# Patient Record
Sex: Female | Born: 1980
Health system: Southern US, Community
[De-identification: ages and names within clinical notes are randomized; demographics above are authoritative.]

## PROBLEM LIST (undated history)

## (undated) DIAGNOSIS — J45909 Unspecified asthma, uncomplicated: Secondary | ICD-10-CM

## (undated) DIAGNOSIS — K259 Gastric ulcer, unspecified as acute or chronic, without hemorrhage or perforation: Secondary | ICD-10-CM

## (undated) DIAGNOSIS — F32A Depression, unspecified: Secondary | ICD-10-CM

## (undated) DIAGNOSIS — I639 Cerebral infarction, unspecified: Secondary | ICD-10-CM

## (undated) DIAGNOSIS — F419 Anxiety disorder, unspecified: Secondary | ICD-10-CM

## (undated) DIAGNOSIS — D219 Benign neoplasm of connective and other soft tissue, unspecified: Secondary | ICD-10-CM

## (undated) DIAGNOSIS — G43909 Migraine, unspecified, not intractable, without status migrainosus: Secondary | ICD-10-CM

## (undated) DIAGNOSIS — F329 Major depressive disorder, single episode, unspecified: Secondary | ICD-10-CM

## (undated) DIAGNOSIS — G40909 Epilepsy, unspecified, not intractable, without status epilepticus: Secondary | ICD-10-CM

## (undated) DIAGNOSIS — R569 Unspecified convulsions: Secondary | ICD-10-CM

## (undated) HISTORY — DX: Anxiety disorder, unspecified: F41.9

## (undated) HISTORY — DX: Migraine, unspecified, not intractable, without status migrainosus: G43.909

## (undated) HISTORY — DX: Depression, unspecified: F32.A

## (undated) HISTORY — PX: UPPER GI ENDOSCOPY: SHX6162

## (undated) HISTORY — PX: KNEE ARTHROSCOPY: SUR90

## (undated) HISTORY — DX: Gastric ulcer, unspecified as acute or chronic, without hemorrhage or perforation: K25.9

## (undated) HISTORY — PX: CHOLECYSTECTOMY: SHX55

## (undated) HISTORY — DX: Major depressive disorder, single episode, unspecified: F32.9

## (undated) HISTORY — PX: COLONOSCOPY: SHX174

## (undated) HISTORY — DX: Benign neoplasm of connective and other soft tissue, unspecified: D21.9

---

## 1999-03-21 ENCOUNTER — Emergency Department (HOSPITAL_COMMUNITY): Admission: EM | Admit: 1999-03-21 | Discharge: 1999-03-22 | Payer: Self-pay

## 1999-12-03 ENCOUNTER — Encounter: Payer: Self-pay | Admitting: Emergency Medicine

## 1999-12-03 ENCOUNTER — Emergency Department (HOSPITAL_COMMUNITY): Admission: EM | Admit: 1999-12-03 | Discharge: 1999-12-03 | Payer: Self-pay | Admitting: Emergency Medicine

## 2000-02-12 ENCOUNTER — Emergency Department (HOSPITAL_COMMUNITY): Admission: EM | Admit: 2000-02-12 | Discharge: 2000-02-12 | Payer: Self-pay | Admitting: Emergency Medicine

## 2000-05-13 ENCOUNTER — Emergency Department (HOSPITAL_COMMUNITY): Admission: EM | Admit: 2000-05-13 | Discharge: 2000-05-13 | Payer: Self-pay | Admitting: *Deleted

## 2000-05-13 ENCOUNTER — Encounter: Payer: Self-pay | Admitting: *Deleted

## 2001-04-02 ENCOUNTER — Emergency Department (HOSPITAL_COMMUNITY): Admission: EM | Admit: 2001-04-02 | Discharge: 2001-04-03 | Payer: Self-pay

## 2001-04-03 ENCOUNTER — Encounter: Payer: Self-pay | Admitting: Emergency Medicine

## 2002-05-13 ENCOUNTER — Emergency Department (HOSPITAL_COMMUNITY): Admission: EM | Admit: 2002-05-13 | Discharge: 2002-05-13 | Payer: Self-pay | Admitting: Emergency Medicine

## 2002-06-12 ENCOUNTER — Emergency Department (HOSPITAL_COMMUNITY): Admission: EM | Admit: 2002-06-12 | Discharge: 2002-06-12 | Payer: Self-pay

## 2002-07-30 ENCOUNTER — Emergency Department (HOSPITAL_COMMUNITY): Admission: EM | Admit: 2002-07-30 | Discharge: 2002-07-30 | Payer: Self-pay | Admitting: Emergency Medicine

## 2003-11-18 ENCOUNTER — Emergency Department (HOSPITAL_COMMUNITY): Admission: EM | Admit: 2003-11-18 | Discharge: 2003-11-18 | Payer: Self-pay | Admitting: Emergency Medicine

## 2004-03-30 ENCOUNTER — Emergency Department (HOSPITAL_COMMUNITY): Admission: EM | Admit: 2004-03-30 | Discharge: 2004-03-30 | Payer: Self-pay

## 2004-07-02 ENCOUNTER — Emergency Department (HOSPITAL_COMMUNITY): Admission: EM | Admit: 2004-07-02 | Discharge: 2004-07-02 | Payer: Self-pay | Admitting: Emergency Medicine

## 2005-01-16 ENCOUNTER — Emergency Department (HOSPITAL_COMMUNITY): Admission: EM | Admit: 2005-01-16 | Discharge: 2005-01-16 | Payer: Self-pay | Admitting: Emergency Medicine

## 2005-01-18 ENCOUNTER — Emergency Department (HOSPITAL_COMMUNITY): Admission: EM | Admit: 2005-01-18 | Discharge: 2005-01-18 | Payer: Self-pay | Admitting: Emergency Medicine

## 2005-03-02 ENCOUNTER — Emergency Department (HOSPITAL_COMMUNITY): Admission: EM | Admit: 2005-03-02 | Discharge: 2005-03-02 | Payer: Self-pay | Admitting: Emergency Medicine

## 2005-05-06 ENCOUNTER — Emergency Department (HOSPITAL_COMMUNITY): Admission: EM | Admit: 2005-05-06 | Discharge: 2005-05-06 | Payer: Self-pay | Admitting: Emergency Medicine

## 2006-01-15 ENCOUNTER — Emergency Department (HOSPITAL_COMMUNITY): Admission: EM | Admit: 2006-01-15 | Discharge: 2006-01-15 | Payer: Self-pay | Admitting: Emergency Medicine

## 2006-01-25 ENCOUNTER — Emergency Department (HOSPITAL_COMMUNITY): Admission: EM | Admit: 2006-01-25 | Discharge: 2006-01-25 | Payer: Self-pay | Admitting: Emergency Medicine

## 2006-05-18 ENCOUNTER — Emergency Department (HOSPITAL_COMMUNITY): Admission: EM | Admit: 2006-05-18 | Discharge: 2006-05-18 | Payer: Self-pay | Admitting: Emergency Medicine

## 2007-02-12 ENCOUNTER — Emergency Department (HOSPITAL_COMMUNITY): Admission: EM | Admit: 2007-02-12 | Discharge: 2007-02-12 | Payer: Self-pay | Admitting: Emergency Medicine

## 2007-06-18 ENCOUNTER — Ambulatory Visit: Payer: Self-pay | Admitting: Psychiatry

## 2007-06-18 ENCOUNTER — Inpatient Hospital Stay (HOSPITAL_COMMUNITY): Admission: AD | Admit: 2007-06-18 | Discharge: 2007-06-24 | Payer: Self-pay | Admitting: Psychiatry

## 2007-06-20 ENCOUNTER — Ambulatory Visit (HOSPITAL_COMMUNITY): Admission: RE | Admit: 2007-06-20 | Discharge: 2007-06-20 | Payer: Self-pay | Admitting: Psychiatry

## 2007-07-06 ENCOUNTER — Emergency Department (HOSPITAL_COMMUNITY): Admission: EM | Admit: 2007-07-06 | Discharge: 2007-07-06 | Payer: Self-pay | Admitting: Emergency Medicine

## 2008-01-02 ENCOUNTER — Emergency Department (HOSPITAL_COMMUNITY): Admission: EM | Admit: 2008-01-02 | Discharge: 2008-01-02 | Payer: Self-pay | Admitting: Family Medicine

## 2008-02-19 ENCOUNTER — Emergency Department (HOSPITAL_COMMUNITY): Admission: EM | Admit: 2008-02-19 | Discharge: 2008-02-19 | Payer: Self-pay | Admitting: Emergency Medicine
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
## 2011-06-26 LAB — URINALYSIS, ROUTINE W REFLEX MICROSCOPIC: Nitrite: NEGATIVE

## 2011-06-26 LAB — CBC
HCT: 33.1 — ABNORMAL LOW
Hemoglobin: 11.2 — ABNORMAL LOW
MCHC: 33.9
Platelets: 216

## 2011-06-26 LAB — COMPREHENSIVE METABOLIC PANEL
BUN: 11
CO2: 26
Chloride: 104
Creatinine, Ser: 0.99
GFR calc Af Amer: >60
GFR calc non Af Amer: >60
Glucose, Bld: 87
Potassium: 3.6
Sodium: 135

## 2011-06-26 LAB — DRUGS OF ABUSE SCREEN W/O ALC, ROUTINE URINE
Barbiturate Quant, Ur: NEGATIVE
Methadone: NEGATIVE
Opiate Screen, Urine: NEGATIVE
Propoxyphene: NEGATIVE

## 2014-12-15 HISTORY — PX: UMBILICAL HERNIA REPAIR: SHX196

## 2015-07-14 ENCOUNTER — Emergency Department (HOSPITAL_COMMUNITY)
Admission: EM | Admit: 2015-07-14 | Discharge: 2015-07-14 | Disposition: A | Discharge status: Home / Self Care | Payer: BLUE CROSS/BLUE SHIELD | Attending: Emergency Medicine | Admitting: Emergency Medicine

## 2015-07-14 ENCOUNTER — Encounter (HOSPITAL_COMMUNITY): Payer: Self-pay | Admitting: Emergency Medicine

## 2015-07-14 DIAGNOSIS — Z880 Allergy status to penicillin: Secondary | ICD-10-CM | POA: Diagnosis not present

## 2015-07-14 DIAGNOSIS — G40919 Epilepsy, unspecified, intractable, without status epilepticus: Secondary | ICD-10-CM

## 2015-07-14 DIAGNOSIS — G40909 Epilepsy, unspecified, not intractable, without status epilepticus: Secondary | ICD-10-CM | POA: Diagnosis not present

## 2015-07-14 DIAGNOSIS — Z79899 Other long term (current) drug therapy: Secondary | ICD-10-CM | POA: Insufficient documentation

## 2015-07-14 DIAGNOSIS — R569 Unspecified convulsions: Secondary | ICD-10-CM | POA: Diagnosis present

## 2015-07-14 HISTORY — DX: Epilepsy, unspecified, not intractable, without status epilepticus: G40.909

## 2015-07-14 LAB — I-STAT CHEM 8, ED
BUN: 9 mg/dL (ref 6–20)
CALCIUM ION: 1.13 mmol/L (ref 1.12–1.23)
CHLORIDE: 104 mmol/L (ref 101–111)
Creatinine, Ser: 0.8 mg/dL (ref 0.44–1.00)
Glucose, Bld: 82 mg/dL (ref 65–99)
HEMATOCRIT: 42 % (ref 36.0–46.0)
Hemoglobin: 14.3 g/dL (ref 12.0–15.0)
POTASSIUM: 3.4 mmol/L — AB (ref 3.5–5.1)
Sodium: 141 mmol/L (ref 135–145)
TCO2: 22 mmol/L (ref 0–100)

## 2015-07-14 LAB — CBG MONITORING, ED: Glucose-Capillary: 65 mg/dL (ref 65–99)

## 2015-07-14 MED ORDER — TOPIRAMATE 50 MG PO TABS: 50.0000 mg | ORAL_TABLET | Freq: Two times a day (BID) | ORAL | Status: DC

## 2015-07-14 MED ORDER — IBUPROFEN 800 MG PO TABS
800.0000 mg | ORAL_TABLET | Freq: Once | ORAL | Status: AC
  Administered 2015-07-14: 800 mg via ORAL
  Filled 2015-07-14: qty 1

## 2015-07-14 NOTE — ED Notes (Signed)
Bed: WA01 Expected date:  Expected time:  Means of arrival:  Comments: EMS- seizures 

## 2015-07-14 NOTE — ED Notes (Signed)
PT is a hard stick and would prefer to only get blood when an IV is done. RN notified.

## 2015-07-14 NOTE — ED Provider Notes (Signed)
CSN: [id number]     Arrival date & time 07/14/15  1520 History   First MD Initiated Contact with Patient 07/14/15 1524     Chief Complaint  Patient presents with  . Seizures     (Consider location/radiation/quality/duration/timing/severity/associated sxs/prior Treatment) Patient is a 34 y.o. female presenting with seizures.  Seizures Seizure activity on arrival: no   Seizure type:  Unable to specify Preceding symptoms: aura   Initial focality:  None Episode characteristics: no abnormal movements and no confusion   Return to baseline: no   Severity:  Mild Duration:  3 minutes Number of seizures this episode:  3 Progression:  Resolved Context: not alcohol withdrawal and not cerebral palsy   Recent head injury:  No recent head injuries PTA treatment:  None History of seizures: yes       Past Medical History  Diagnosis Date  . Epilepsy ([place])    No past surgical history on file. No family history on file. Social History  Substance Use Topics  . Smoking status: None  . Smokeless tobacco: None  . Alcohol Use: None   OB History    No data available     Review of Systems  Eyes: Negative for pain.  Respiratory: Negative for cough and shortness of breath.   Cardiovascular: Negative for chest pain and palpitations.  Gastrointestinal: Negative for nausea and vomiting.  Endocrine: Negative for polydipsia and polyuria.  Genitourinary: Negative for dysuria and flank pain.  Neurological: Positive for seizures.  All other systems reviewed and are negative.     Allergies  Bee venom; Chocolate; Mosquito (diagnostic); Oregano; Garlic; Orange fruit; and Penicillins  Home Medications   Prior to Admission medications   Medication Sig Start Date End Date Taking? Authorizing Provider  albuterol (PROVENTIL HFA;VENTOLIN HFA) 108 (90 BASE) MCG/ACT inhaler Inhale 2 puffs into the lungs every 4 (four) hours as needed for wheezing or shortness of breath.   Yes Historical Provider,  MD  cetirizine (ZYRTEC) 10 MG tablet Take 10 mg by mouth daily.   Yes Historical Provider, MD  Fluticasone Furoate-Vilanterol (BREO ELLIPTA) 200-25 MCG/INH AEPB Inhale 1 puff into the lungs daily after breakfast.   Yes Historical Provider, MD  omeprazole (PRILOSEC) 20 MG capsule Take 20 mg by mouth daily.   Yes Historical Provider, MD  traZODone (DESYREL) 50 MG tablet Take 50 mg by mouth at bedtime.   Yes Historical Provider, MD  topiramate (TOPAMAX) 50 MG tablet Take 1 tablet (50 mg total) by mouth 2 (two) times daily. 07/14/15   Merrily Pew, MD   BP 115/82 mmHg  Pulse 75  Temp(Src) 98.4 F (36.9 C) (Oral)  Resp 17  SpO2 100% Physical Exam  Neurological:  No altered mental status, able to give full seemingly accurate history.  Face is symmetric, EOM's intact, pupils equal and reactive, vision intact, tongue and uvula midline without deviation Upper and Lower extremity motor 5/5, intact pain perception in distal extremities, 2+ reflexes in biceps, patella and achilles tendons. Finger to nose normal, heel to shin normal.   Nursing note and vitals reviewed.   ED Course  Procedures (including critical care time) Labs Review Labs Reviewed  I-STAT CHEM 8, ED - Abnormal; Notable for the following:    Potassium 3.4 (*)    All other components within normal limits  CBG MONITORING, ED    Imaging Review No results found. I have personally reviewed and evaluated these images and lab results as part of my medical decision-making.   EKG Interpretation  Date/Time:  Saturday July 14 2015 15:31:21 EDT Ventricular Rate:  83 PR Interval:  141 QRS Duration: 74 QT Interval:  373 QTC Calculation: 438 R Axis:   76 Text Interpretation:  Normal sinus rhythm Confirmed by Danila Eddie MD, Corene Cornea  [phone]) on 07/14/2015 5:02:23 PM      MDM   Final diagnoses:  Breakthrough seizure ([place])   34 year old female history of seizures. Initially not able to obtain history or exam secondary to her  post ictal state. When patient woke and her girlfriend was at bedside found out she had had 3 back-to-back seizures that sounded similar to previous was just had. She is on her medications or any changes. She hasn't had any recent infections or vomiting. CMP was normal patient close to baseline prior to discharge. Gave her information for neurologists in the area and she will follow up with them for further evaluation and management of her epilepsy.  I have personally and contemperaneously reviewed labs and imaging and used in my decision making as above.   A medical screening exam was performed and I feel the patient has had an appropriate workup for their chief complaint at this time and likelihood of emergent condition existing is low. They have been counseled on decision, discharge, follow up and which symptoms necessitate immediate return to the emergency department. They or their family verbally stated understanding and agreement with plan and discharged in stable condition.      Merrily Pew, MD 07/14/15 [phone]
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
## 2015-07-14 NOTE — ED Notes (Signed)
Per EMS: Pt from home.  3 witnessed seizures.  Takes meds for them.  Can't remember what it's called.  Moved here recently so doesn't have a doctor recently.  States she has been taking meds as she should.  No seizures with EMS.  A&O x 4. A little postictal.

## 2015-07-29 ENCOUNTER — Emergency Department (HOSPITAL_COMMUNITY)
Admission: EM | Admit: 2015-07-29 | Discharge: 2015-07-29 | Disposition: A | Discharge status: Home / Self Care | Payer: BLUE CROSS/BLUE SHIELD | Attending: Emergency Medicine | Admitting: Emergency Medicine

## 2015-07-29 ENCOUNTER — Encounter (HOSPITAL_COMMUNITY): Payer: Self-pay | Admitting: *Deleted

## 2015-07-29 DIAGNOSIS — G40909 Epilepsy, unspecified, not intractable, without status epilepticus: Secondary | ICD-10-CM | POA: Insufficient documentation

## 2015-07-29 DIAGNOSIS — Z79899 Other long term (current) drug therapy: Secondary | ICD-10-CM | POA: Insufficient documentation

## 2015-07-29 DIAGNOSIS — F17210 Nicotine dependence, cigarettes, uncomplicated: Secondary | ICD-10-CM | POA: Diagnosis not present

## 2015-07-29 DIAGNOSIS — Z3202 Encounter for pregnancy test, result negative: Secondary | ICD-10-CM | POA: Diagnosis not present

## 2015-07-29 DIAGNOSIS — Z880 Allergy status to penicillin: Secondary | ICD-10-CM | POA: Insufficient documentation

## 2015-07-29 DIAGNOSIS — R569 Unspecified convulsions: Secondary | ICD-10-CM

## 2015-07-29 DIAGNOSIS — J45909 Unspecified asthma, uncomplicated: Secondary | ICD-10-CM | POA: Diagnosis not present

## 2015-07-29 HISTORY — DX: Unspecified asthma, uncomplicated: J45.909

## 2015-07-29 LAB — POC URINE PREG, ED: PREG TEST UR: NEGATIVE

## 2015-07-29 MED ORDER — SODIUM CHLORIDE 0.9 % IV SOLN
1000.0000 mg | Freq: Once | INTRAVENOUS | Status: AC
  Administered 2015-07-29: 1000 mg via INTRAVENOUS
  Filled 2015-07-29: qty 10

## 2015-07-29 MED ORDER — LEVETIRACETAM 500 MG PO TABS: 500.0000 mg | ORAL_TABLET | Freq: Two times a day (BID) | ORAL | Status: DC

## 2015-07-29 MED ORDER — ACETAMINOPHEN 325 MG PO TABS
650.0000 mg | ORAL_TABLET | Freq: Once | ORAL | Status: AC
Start: 2015-07-29 — End: 2015-07-29
  Administered 2015-07-29: 650 mg via ORAL
  Filled 2015-07-29: qty 2

## 2015-07-29 NOTE — ED Notes (Signed)
EMS called to home.  Found patient post-ictal after a 15 second witnessed seizure. Patient is alert and oriented arrival to the ED.  Patient denies any pain.  Patient did not Hit her head

## 2015-07-29 NOTE — ED Notes (Signed)
Bed: [id number] Expected date: 07/29/15 Expected time: 7:17 PM Means of arrival: Ambulance Comments: seizure

## 2015-07-29 NOTE — Discharge Instructions (Signed)

## 2015-07-29 NOTE — ED Provider Notes (Signed)
CSN: [id number]     Arrival date & time 07/29/15  1929 History   First MD Initiated Contact with Patient 07/29/15 1941     Chief Complaint  Patient presents with  . Seizures     (Consider location/radiation/quality/duration/timing/severity/associated sxs/prior Treatment) HPI Comments: Patient presents after a seizure. She has a history of seizures. She states she's had seizures since she was 34 years old. She was on Dilantin for a long period time. She recently moved from [place]. Prior to her recent move, and October, she was switched to Topamax. She states that she's been having frequent seizures since starting the Topamax. She started the Topamax due to side effects from the Dilantin. She had witnessed 2 seizures today each lasting about 1-2 minutes. She was not incontinent. She did not bite her tongue. She has a mild headache but denies any other injuries from the seizure. She denies any fevers or other recent illnesses. She states she's been taking her medicine consistently.  Patient is a 34 y.o. female presenting with seizures.  Seizures   Past Medical History  Diagnosis Date  . Epilepsy ([place])   . Asthma    No past surgical history on file. No family history on file. Social History  Substance Use Topics  . Smoking status: Current Every Day Smoker    Types: Cigarettes  . Smokeless tobacco: None  . Alcohol Use: No   OB History    No data available     Review of Systems  Constitutional: Negative for fever, chills, diaphoresis and fatigue.  HENT: Negative for congestion, rhinorrhea and sneezing.   Eyes: Negative.   Respiratory: Negative for cough, chest tightness and shortness of breath.   Cardiovascular: Negative for chest pain and leg swelling.  Gastrointestinal: Negative for nausea, vomiting, abdominal pain, diarrhea and blood in stool.  Genitourinary: Negative for frequency, hematuria, flank pain and difficulty urinating.  Musculoskeletal: Negative for back pain  and arthralgias.  Skin: Negative for rash.  Neurological: Positive for seizures and headaches. Negative for dizziness, speech difficulty, weakness and numbness.      Allergies  Bee venom; Chocolate; Mosquito (diagnostic); Oregano; Garlic; Orange fruit; Penicillins; and Tape  Home Medications   Prior to Admission medications   Medication Sig Start Date End Date Taking? Authorizing Provider  albuterol (PROVENTIL HFA;VENTOLIN HFA) 108 (90 BASE) MCG/ACT inhaler Inhale 2 puffs into the lungs every 4 (four) hours as needed for wheezing or shortness of breath.   Yes Historical Provider, MD  cetirizine (ZYRTEC) 10 MG tablet Take 10 mg by mouth daily as needed for allergies.    Yes Historical Provider, MD  Fluticasone Furoate-Vilanterol (BREO ELLIPTA) 200-25 MCG/INH AEPB Inhale 1 puff into the lungs daily after breakfast.   Yes Historical Provider, MD  omeprazole (PRILOSEC) 20 MG capsule Take 20 mg by mouth daily.   Yes Historical Provider, MD  topiramate (TOPAMAX) 50 MG tablet Take 1 tablet (50 mg total) by mouth 2 (two) times daily. 07/14/15  Yes Merrily Pew, MD  traZODone (DESYREL) 50 MG tablet Take 50 mg by mouth at bedtime.   Yes Historical Provider, MD  levETIRAcetam (KEPPRA) 500 MG tablet Take 1 tablet (500 mg total) by mouth 2 (two) times daily. 07/29/15   Malvin Johns, MD   BP 126/65 mmHg  Pulse 89  Temp(Src) 98.3 F (36.8 C) (Oral)  Resp 18  SpO2 100%  LMP 06/28/2015 (Approximate) Physical Exam  Constitutional: She is oriented to person, place, and time. She appears well-developed and well-nourished.  HENT:  Head: Normocephalic and atraumatic.  Eyes: Pupils are equal, round, and reactive to light.  Neck: Normal range of motion. Neck supple.  Cardiovascular: Normal rate, regular rhythm and normal heart sounds.   Pulmonary/Chest: Effort normal and breath sounds normal. No respiratory distress. She has no wheezes. She has no rales. She exhibits no tenderness.  Abdominal: Soft.  Bowel sounds are normal. There is no tenderness. There is no rebound and no guarding.  Musculoskeletal: Normal range of motion. She exhibits no edema.  Lymphadenopathy:    She has no cervical adenopathy.  Neurological: She is alert and oriented to person, place, and time. She has normal strength. No cranial nerve deficit or sensory deficit. GCS eye subscore is 4. GCS verbal subscore is 5. GCS motor subscore is 6.  Skin: Skin is warm and dry. No rash noted.  Psychiatric: She has a normal mood and affect.    ED Course  Procedures (including critical care time) Labs Review Labs Reviewed  POC URINE PREG, ED    Imaging Review No results found. I have personally reviewed and evaluated these images and lab results as part of my medical decision-making.   EKG Interpretation None      MDM   Final diagnoses:  Seizure [facility])    Patient has had no further seizure activity in the ED. She does states that she's had frequent seizures since switching to the Topamax. She was here on October 29 with seizures. I spoke with Dr. Nicole Kindred who recommends loading her with Keppra and starting her on oral Keppra 500 mg twice a day in addition to her Topamax. I will give her outpatient referral to neurology.    Malvin Johns, MD 07/29/15 2216

## 2015-07-29 NOTE — ED Notes (Signed)
Patient is alert and oriented x3.  She was given DC instructions and follow up visit instructions.  Patient gave verbal understanding. She was DC ambulatory under her own power to home.  V/S stable.  He was not showing any signs of distress on DC 

## 2015-07-30 ENCOUNTER — Encounter (HOSPITAL_COMMUNITY): Payer: Self-pay | Admitting: *Deleted

## 2015-07-30 ENCOUNTER — Emergency Department (HOSPITAL_COMMUNITY)
Admission: EM | Admit: 2015-07-30 | Discharge: 2015-07-31 | Disposition: A | Discharge status: Other Acute Inpatient Hospital | Payer: BLUE CROSS/BLUE SHIELD | Attending: Emergency Medicine | Admitting: Emergency Medicine

## 2015-07-30 DIAGNOSIS — Y998 Other external cause status: Secondary | ICD-10-CM | POA: Diagnosis not present

## 2015-07-30 DIAGNOSIS — Z8673 Personal history of transient ischemic attack (TIA), and cerebral infarction without residual deficits: Secondary | ICD-10-CM | POA: Insufficient documentation

## 2015-07-30 DIAGNOSIS — J45909 Unspecified asthma, uncomplicated: Secondary | ICD-10-CM | POA: Insufficient documentation

## 2015-07-30 DIAGNOSIS — F1510 Other stimulant abuse, uncomplicated: Secondary | ICD-10-CM | POA: Insufficient documentation

## 2015-07-30 DIAGNOSIS — T43211A Poisoning by selective serotonin and norepinephrine reuptake inhibitors, accidental (unintentional), initial encounter: Secondary | ICD-10-CM | POA: Diagnosis present

## 2015-07-30 DIAGNOSIS — Y9389 Activity, other specified: Secondary | ICD-10-CM | POA: Diagnosis not present

## 2015-07-30 DIAGNOSIS — Y9289 Other specified places as the place of occurrence of the external cause: Secondary | ICD-10-CM | POA: Diagnosis not present

## 2015-07-30 DIAGNOSIS — T1491 Suicide attempt: Secondary | ICD-10-CM | POA: Diagnosis not present

## 2015-07-30 DIAGNOSIS — F17200 Nicotine dependence, unspecified, uncomplicated: Secondary | ICD-10-CM | POA: Insufficient documentation

## 2015-07-30 DIAGNOSIS — Z3202 Encounter for pregnancy test, result negative: Secondary | ICD-10-CM | POA: Insufficient documentation

## 2015-07-30 DIAGNOSIS — X58XXXA Exposure to other specified factors, initial encounter: Secondary | ICD-10-CM | POA: Diagnosis not present

## 2015-07-30 DIAGNOSIS — T1491XA Suicide attempt, initial encounter: Secondary | ICD-10-CM

## 2015-07-30 HISTORY — DX: Unspecified convulsions: R56.9

## 2015-07-30 HISTORY — DX: Cerebral infarction, unspecified: I63.9

## 2015-07-30 LAB — RAPID URINE DRUG SCREEN, HOSP PERFORMED
AMPHETAMINES: NOT DETECTED
BARBITURATES: POSITIVE — AB
Benzodiazepines: NOT DETECTED
Cocaine: NOT DETECTED
Opiates: NOT DETECTED
TETRAHYDROCANNABINOL: NOT DETECTED

## 2015-07-30 LAB — COMPREHENSIVE METABOLIC PANEL
ALK PHOS: 41 U/L (ref 38–126)
ALT: 16 U/L (ref 14–54)
ANION GAP: 8 (ref 5–15)
AST: 21 U/L (ref 15–41)
Albumin: 4.2 g/dL (ref 3.5–5.0)
BILIRUBIN TOTAL: 0.9 mg/dL (ref 0.3–1.2)
BUN: 11 mg/dL (ref 6–20)
CALCIUM: 9.3 mg/dL (ref 8.9–10.3)
CO2: 19 mmol/L — ABNORMAL LOW (ref 22–32)
Chloride: 110 mmol/L (ref 101–111)
Creatinine, Ser: 0.89 mg/dL (ref 0.44–1.00)
GFR calc non Af Amer: >60 mL/min (ref >60)
Glucose, Bld: 89 mg/dL (ref 65–99)
Potassium: 3.9 mmol/L (ref 3.5–5.1)
Sodium: 137 mmol/L (ref 135–145)
TOTAL PROTEIN: 7.3 g/dL (ref 6.5–8.1)

## 2015-07-30 LAB — I-STAT BETA HCG BLOOD, ED (MC, WL, AP ONLY)

## 2015-07-30 LAB — CBC WITH DIFFERENTIAL/PLATELET
BASOS PCT: 1 %
Basophils Absolute: 0 10*3/uL (ref 0.0–0.1)
EOS ABS: 0.2 10*3/uL (ref 0.0–0.7)
Eosinophils Relative: 3 %
HEMATOCRIT: 36.4 % (ref 36.0–46.0)
Hemoglobin: 12.4 g/dL (ref 12.0–15.0)
Lymphocytes Relative: 29 %
Lymphs Abs: 1.5 10*3/uL (ref 0.7–4.0)
MCH: 31.7 pg (ref 26.0–34.0)
MCHC: 34.1 g/dL (ref 30.0–36.0)
MCV: 93.1 fL (ref 78.0–100.0)
MONO ABS: 0.5 10*3/uL (ref 0.1–1.0)
MONOS PCT: 9 %
NEUTROS ABS: 3.1 10*3/uL (ref 1.7–7.7)
Neutrophils Relative %: 58 %
Platelets: 215 10*3/uL (ref 150–400)
RBC: 3.91 MIL/uL (ref 3.87–5.11)
RDW: 12.7 % (ref 11.5–15.5)
WBC: 5.2 10*3/uL (ref 4.0–10.5)

## 2015-07-30 LAB — URINALYSIS, ROUTINE W REFLEX MICROSCOPIC
BILIRUBIN URINE: NEGATIVE
GLUCOSE, UA: NEGATIVE mg/dL
HGB URINE DIPSTICK: NEGATIVE
Ketones, ur: NEGATIVE mg/dL
Leukocytes, UA: NEGATIVE
Nitrite: NEGATIVE
Protein, ur: NEGATIVE mg/dL
SPECIFIC GRAVITY, URINE: 1.01 (ref 1.005–1.030)
Urobilinogen, UA: 0.2 mg/dL (ref 0.0–1.0)
pH: 7 (ref 5.0–8.0)

## 2015-07-30 LAB — I-STAT CG4 LACTIC ACID, ED: LACTIC ACID, VENOUS: 1.41 mmol/L (ref 0.5–2.0)

## 2015-07-30 LAB — ETHANOL

## 2015-07-30 LAB — ACETAMINOPHEN LEVEL

## 2015-07-30 LAB — LIPASE, BLOOD: Lipase: 31 U/L (ref 11–51)

## 2015-07-30 LAB — SALICYLATE LEVEL: Salicylate Lvl: <4 mg/dL (ref 2.8–30.0)

## 2015-07-30 MED ORDER — SODIUM CHLORIDE 0.9 % IV BOLUS (SEPSIS): 1000.0000 mL | Freq: Once | INTRAVENOUS | Status: DC

## 2015-07-30 MED ORDER — SODIUM CHLORIDE 0.9 % IV BOLUS (SEPSIS)
1000.0000 mL | Freq: Once | INTRAVENOUS | Status: AC
  Administered 2015-07-30: 1000 mL via INTRAVENOUS

## 2015-07-30 NOTE — ED Notes (Signed)
Family at bedside. 

## 2015-07-30 NOTE — ED Notes (Signed)
Pt placed in maroon scrubs. Pt wanded by security. Pt's belongings taken to nurses' station.

## 2015-07-30 NOTE — ED Notes (Signed)
Sitter at bedside.

## 2015-07-30 NOTE — ED Notes (Addendum)
Pt's fiance took home all pt's jewelry, cell phone, shoes, pants, shirt, undershirt, medicines and knee brace

## 2015-07-30 NOTE — ED Notes (Signed)
TTS set up at bedside. 
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
## 2015-07-30 NOTE — ED Notes (Signed)
Pt reports taking 6 pills last night to help her sleep, reports having intention of harming herself. Possibly pregnant, reports LMP early OCT.

## 2015-07-30 NOTE — BH Assessment (Signed)
Per Laura, NP - patient meets criteria for inpatient hospitalization. CSW will seek placement.  

## 2015-07-30 NOTE — ED Notes (Signed)
Pt remains monitored by blood pressure, pulse ox, and 5 lead. Pts family and safety sitter remains at bedside.

## 2015-07-30 NOTE — ED Provider Notes (Signed)
CSN: [id number]     Arrival date & time 07/30/15  1103 History   First MD Initiated Contact with Patient 07/30/15 1109     Chief Complaint  Patient presents with  . Drug Overdose   HPI   Tina [name] is a 34 y.o. F PMH significant for seizures presenting with possible trazodone overdose. She states she was having SI this morning at 1 am and took six 50 mg trazodone tablets. She said she did it because she felt like she was a burden to everyone. She has attempted suicide before. Her fiance, present at bedside, states the patient told her she was going to overdose, but she did not think she took anything. Currently, she complains of headache, abdominal pain, and feeling depressed. She did not vomit afterwards. No fevers, chills, palpitations, N/V/D, changes in bowel/bladder movements.   Past Medical History  Diagnosis Date  . Asthma   . Seizures ([place])   . Stroke [facility])    History reviewed. No pertinent past surgical history. History reviewed. No pertinent family history. Social History  Substance Use Topics  . Smoking status: Current Every Day Smoker  . Smokeless tobacco: None  . Alcohol Use: Yes     Comment: occ   OB History    No data available     Review of Systems  Ten systems are reviewed and are negative for acute change except as noted in the HPI   Allergies  Review of patient's allergies indicates not on file.  Home Medications   Prior to Admission medications   Not on File   BP 111/72 mmHg  Pulse 78  Temp(Src) 98.2 F (36.8 C) (Oral)  Resp 18  Ht 5\' 4"  (1.626 m)  SpO2 100%  LMP 06/30/2015 Physical Exam  Constitutional: She is oriented to person, place, and time. She appears well-developed and well-nourished. No distress.  HENT:  Head: Normocephalic and atraumatic.  Mouth/Throat: Oropharynx is clear and moist. No oropharyngeal exudate.  Eyes: Conjunctivae are normal. Pupils are equal, round, and reactive to light. Right eye exhibits no  discharge. Left eye exhibits no discharge. No scleral icterus.  Neck: Normal range of motion. No tracheal deviation present.  Cardiovascular: Normal rate, regular rhythm, normal heart sounds and intact distal pulses.  Exam reveals no gallop and no friction rub.   No murmur heard. Pulmonary/Chest: Effort normal and breath sounds normal. No respiratory distress. She has no wheezes. She has no rales. She exhibits no tenderness.  Abdominal: Soft. Bowel sounds are normal. She exhibits no distension and no mass. There is tenderness. There is no rebound and no guarding.  RUQ tenderness. Negative Murphy's.  Musculoskeletal: Normal range of motion. She exhibits no edema.  Lymphadenopathy:    She has no cervical adenopathy.  Neurological: She is alert and oriented to person, place, and time. No cranial nerve deficit. Coordination normal.  Skin: Skin is warm and dry. No rash noted. She is not diaphoretic. No erythema.  Psychiatric: Her behavior is normal.  Depressed affect.  Nursing note and vitals reviewed.   ED Course  Procedures  Labs Review Labs Reviewed  COMPREHENSIVE METABOLIC PANEL - Abnormal; Notable for the following:    CO2 19 (*)    All other components within normal limits  ACETAMINOPHEN LEVEL - Abnormal; Notable for the following:    Acetaminophen (Tylenol), Serum <10 (*)    All other components within normal limits  URINE RAPID DRUG SCREEN, HOSP PERFORMED - Abnormal; Notable for the following:  Barbiturates POSITIVE (*)    All other components within normal limits  LIPASE, BLOOD  SALICYLATE LEVEL  CBC WITH DIFFERENTIAL/PLATELET  URINALYSIS, ROUTINE W REFLEX MICROSCOPIC (NOT AT [facility])  ETHANOL  TRAZODONE (DESYREL), BLOOD  I-STAT CG4 LACTIC ACID, ED  I-STAT BETA HCG BLOOD, ED ([facility], [facility], [facility] ONLY)     EKG Interpretation   Date/Time:  Monday July 30 2015 11:44:59 EST Ventricular Rate:  73 PR Interval:  147 QRS Duration: 76 QT Interval:  382 QTC Calculation: 421 R  Axis:   88 Text Interpretation:  Sinus rhythm Normal ECG Confirmed by DELO  MD,  DOUGLAS ([id number]) on 07/30/2015 12:21:36 PM      MDM   Final diagnoses:  None   Patient non-toxic appearing with a depressed affect. Patient's fiance at bedside, who tells me patient may be pregnant, and they are requesting a blood hcg test vs. urine.  Workup thus far has been negative. At this time, patient medically cleared to be moved to psych hold and Pod C, as well as consult TSS. Discussed plan with patient and fiance, who are in understanding and agreement with the plan.   [place], PA-C 07/31/15 [place], MD 07/31/15 [phone]

## 2015-07-30 NOTE — ED Notes (Signed)
Secretary called for [employer]

## 2015-07-30 NOTE — ED Notes (Signed)
Gave report to Brittany, RN

## 2015-07-31 ENCOUNTER — Encounter (HOSPITAL_COMMUNITY): Payer: Self-pay | Admitting: *Deleted

## 2015-07-31 ENCOUNTER — Inpatient Hospital Stay (HOSPITAL_COMMUNITY)
Admission: AD | Admit: 2015-07-31 | Discharge: 2015-08-03 | DRG: 885 | Disposition: A | Discharge status: Home / Self Care | Payer: BLUE CROSS/BLUE SHIELD | Source: Intra-hospital | Attending: Psychiatry | Admitting: Psychiatry

## 2015-07-31 ENCOUNTER — Encounter (HOSPITAL_COMMUNITY): Payer: Self-pay

## 2015-07-31 DIAGNOSIS — Z915 Personal history of self-harm: Secondary | ICD-10-CM

## 2015-07-31 DIAGNOSIS — T4272XA Poisoning by unspecified antiepileptic and sedative-hypnotic drugs, intentional self-harm, initial encounter: Secondary | ICD-10-CM | POA: Diagnosis not present

## 2015-07-31 DIAGNOSIS — J45909 Unspecified asthma, uncomplicated: Secondary | ICD-10-CM | POA: Diagnosis present

## 2015-07-31 DIAGNOSIS — Z8673 Personal history of transient ischemic attack (TIA), and cerebral infarction without residual deficits: Secondary | ICD-10-CM

## 2015-07-31 DIAGNOSIS — T1491XA Suicide attempt, initial encounter: Secondary | ICD-10-CM | POA: Insufficient documentation

## 2015-07-31 DIAGNOSIS — F329 Major depressive disorder, single episode, unspecified: Secondary | ICD-10-CM | POA: Diagnosis present

## 2015-07-31 DIAGNOSIS — F323 Major depressive disorder, single episode, severe with psychotic features: Principal | ICD-10-CM | POA: Diagnosis present

## 2015-07-31 DIAGNOSIS — F17200 Nicotine dependence, unspecified, uncomplicated: Secondary | ICD-10-CM | POA: Diagnosis present

## 2015-07-31 DIAGNOSIS — T1491 Suicide attempt: Secondary | ICD-10-CM | POA: Diagnosis not present

## 2015-07-31 MED ORDER — ACETAMINOPHEN 325 MG PO TABS: 650.0000 mg | ORAL_TABLET | Freq: Four times a day (QID) | ORAL | Status: DC | PRN

## 2015-07-31 MED ORDER — PANTOPRAZOLE SODIUM 40 MG PO TBEC
40.0000 mg | DELAYED_RELEASE_TABLET | Freq: Every day | ORAL | Status: DC
  Administered 2015-07-31: 40 mg via ORAL
  Filled 2015-07-31 (×2): qty 1

## 2015-07-31 MED ORDER — TOPIRAMATE 25 MG PO TABS
50.0000 mg | ORAL_TABLET | Freq: Two times a day (BID) | ORAL | Status: DC
  Administered 2015-07-31 – 2015-08-03 (×6): 50 mg via ORAL
  Filled 2015-07-31 (×11): qty 2

## 2015-07-31 MED ORDER — BUDESONIDE-FORMOTEROL FUMARATE 160-4.5 MCG/ACT IN AERO
2.0000 | INHALATION_SPRAY | Freq: Two times a day (BID) | RESPIRATORY_TRACT | Status: DC
  Administered 2015-07-31: 2 via RESPIRATORY_TRACT
  Filled 2015-07-31: qty 6

## 2015-07-31 MED ORDER — ZIPRASIDONE MESYLATE 20 MG IM SOLR: 20.0000 mg | INTRAMUSCULAR | Status: DC | PRN

## 2015-07-31 MED ORDER — HYDROXYZINE HCL 25 MG PO TABS: 25.0000 mg | ORAL_TABLET | Freq: Four times a day (QID) | ORAL | Status: DC | PRN

## 2015-07-31 MED ORDER — DOXEPIN HCL 10 MG PO CAPS
10.0000 mg | ORAL_CAPSULE | Freq: Every evening | ORAL | Status: DC | PRN
  Administered 2015-07-31: 10 mg via ORAL
  Filled 2015-07-31: qty 1

## 2015-07-31 MED ORDER — PANTOPRAZOLE SODIUM 40 MG PO TBEC
40.0000 mg | DELAYED_RELEASE_TABLET | Freq: Every day | ORAL | Status: DC
  Administered 2015-08-01 – 2015-08-03 (×3): 40 mg via ORAL
  Filled 2015-07-31 (×6): qty 1

## 2015-07-31 MED ORDER — INFLUENZA VAC SPLIT QUAD 0.5 ML IM SUSY
0.5000 mL | PREFILLED_SYRINGE | INTRAMUSCULAR | Status: AC
  Administered 2015-08-01: 0.5 mL via INTRAMUSCULAR
  Filled 2015-07-31: qty 0.5

## 2015-07-31 MED ORDER — RISPERIDONE 2 MG PO TBDP: 2.0000 mg | ORAL_TABLET | Freq: Three times a day (TID) | ORAL | Status: DC | PRN

## 2015-07-31 MED ORDER — ALUM & MAG HYDROXIDE-SIMETH 200-200-20 MG/5ML PO SUSP: 30.0000 mL | ORAL | Status: DC | PRN

## 2015-07-31 MED ORDER — MAGNESIUM HYDROXIDE 400 MG/5ML PO SUSP: 30.0000 mL | Freq: Every day | ORAL | Status: DC | PRN

## 2015-07-31 MED ORDER — LORAZEPAM 1 MG PO TABS: 1.0000 mg | ORAL_TABLET | ORAL | Status: DC | PRN

## 2015-07-31 MED ORDER — TOPIRAMATE 25 MG PO TABS
50.0000 mg | ORAL_TABLET | Freq: Two times a day (BID) | ORAL | Status: DC
  Administered 2015-07-31: 50 mg via ORAL
  Filled 2015-07-31 (×2): qty 2

## 2015-07-31 NOTE — BHH Group Notes (Signed)
Adult Psychoeducational Group Note  Date:  07/31/2015 Time:  11:23 PM  Group Topic/Focus:  Wrap-Up Group:   The focus of this group is to help patients review their daily goal of treatment and discuss progress on daily workbooks.  Participation Level:  Active  Participation Quality:  Appropriate  Affect:  Appropriate  Cognitive:  Appropriate  Insight: Appropriate  Engagement in Group:  Engaged  Modes of Intervention:  Discussion  Additional Comments:  Patient stated she slept most of the day and she stated she was ready to go home.  She expressed she had a visit with her children and fiancee and hopes to go home on Friday.  She also stated she needs to focus on being herself and not caring what other people think of her.  Victorino Sparrow A 07/31/2015, 11:23 PM

## 2015-07-31 NOTE — ED Notes (Signed)
Patient was given a snack and drink. Regular diet order taken for lunch.

## 2015-07-31 NOTE — ED Notes (Signed)
On phone at nurses' desk.

## 2015-07-31 NOTE — BHH Counselor (Signed)
Referral sent to [place], [facility], [facility], [place], [place], [place], [place], [place], [place], and [facility]  [place] K. Nash Shearer, LPC-A, [facility]  Counselor 07/31/2015 1:29 AM

## 2015-07-31 NOTE — ED Notes (Signed)
Attempted to call report to [facility] - was advised to call back in 10 min.

## 2015-07-31 NOTE — ED Notes (Signed)
Pt signed consent forms - copy faxed to [facility], copy sent to medical records and original being sent to [facility].

## 2015-07-31 NOTE — ED Notes (Signed)
Ambulatory w/ steady gait to restroom. 

## 2015-07-31 NOTE — ED Notes (Signed)
Breakfast tray provided to pt.

## 2015-07-31 NOTE — ED Notes (Signed)
Per Randall Hiss, [facility], pt accepted to [facility] - [place].

## 2015-07-31 NOTE — Progress Notes (Signed)
Patient is a 34 year old female who was admitted to the unit due to a suicide attempt on 07/30/15 using sleeping pills.  Patient is calm cooperative upon admission.  Safety search complete and no contraband found.  Patient reports a decrease in suicidal thoughts and is able to contract for safety at this time.

## 2015-07-31 NOTE — ED Notes (Signed)
Per pt's fiance, Junie Panning, pt feels much better this am after getting some sleep. States is calling this am to get her set up w/psych to get meds adjusted so she can sleep better at home d/t pt c/o is unable to sleep.

## 2015-07-31 NOTE — ED Notes (Signed)
Pt calling fiance to notify she is being transported to [facility].

## 2015-07-31 NOTE — ED Notes (Signed)
Pt family member called to speak with pt. This RN explained to pt that no phone calls at night. Pt requesting to know when she will be able to make phone call. This RN explained pt can make phone call at [phone].

## 2015-08-01 ENCOUNTER — Encounter (HOSPITAL_COMMUNITY): Payer: Self-pay | Admitting: Psychiatry

## 2015-08-01 DIAGNOSIS — T1491 Suicide attempt: Secondary | ICD-10-CM

## 2015-08-01 DIAGNOSIS — T4272XA Poisoning by unspecified antiepileptic and sedative-hypnotic drugs, intentional self-harm, initial encounter: Secondary | ICD-10-CM

## 2015-08-01 MED ORDER — ARIPIPRAZOLE 5 MG PO TABS
5.0000 mg | ORAL_TABLET | Freq: Every day | ORAL | Status: DC
  Administered 2015-08-01 – 2015-08-03 (×3): 5 mg via ORAL
  Filled 2015-08-01 (×5): qty 1

## 2015-08-01 MED ORDER — DOXEPIN HCL 10 MG PO CAPS
10.0000 mg | ORAL_CAPSULE | Freq: Once | ORAL | Status: AC
  Administered 2015-08-01: 10 mg via ORAL
  Filled 2015-08-01 (×2): qty 1

## 2015-08-01 MED ORDER — HYDROXYZINE HCL 25 MG PO TABS
25.0000 mg | ORAL_TABLET | Freq: Four times a day (QID) | ORAL | Status: DC | PRN
  Administered 2015-08-02: 25 mg via ORAL
  Filled 2015-08-01 (×2): qty 1

## 2015-08-01 MED ORDER — ALBUTEROL SULFATE HFA 108 (90 BASE) MCG/ACT IN AERS
2.0000 | INHALATION_SPRAY | RESPIRATORY_TRACT | Status: DC | PRN
  Administered 2015-08-01 – 2015-08-03 (×5): 2 via RESPIRATORY_TRACT
  Filled 2015-08-01: qty 6.7

## 2015-08-01 MED ORDER — LEVETIRACETAM 500 MG PO TABS
500.0000 mg | ORAL_TABLET | Freq: Two times a day (BID) | ORAL | Status: DC
  Administered 2015-08-01 – 2015-08-03 (×4): 500 mg via ORAL
  Filled 2015-08-01 (×8): qty 1

## 2015-08-01 NOTE — BHH Group Notes (Signed)
[facility] LCSW Aftercare Discharge Planning Group Note  08/01/2015 8:45 AM  Participation Quality: Alert, Appropriate and Oriented  Mood/Affect: Appropriate  Depression Rating: 0  Anxiety Rating: 0  Thoughts of Suicide: Pt denies SI/HI  Will you contract for safety? Yes  Current AVH: Pt denies  Plan for Discharge/Comments: Pt attended discharge planning group and actively participated in group. CSW discussed suicide prevention education with the group and encouraged them to discuss discharge planning and any relevant barriers. Pt expressed that she feels find and is hoping to have a therapist set up when she leaves the hospital.  Transportation Means: Pt reports access to transportation  Supports: No supports mentioned at this time  Peri Maris, [place] 08/01/2015 1:05 PM

## 2015-08-01 NOTE — H&P (Signed)
Psychiatric Admission Assessment Adult  Patient Identification: Tina [name] MRN:  [id number] Date of Evaluation:  08/01/2015 Chief Complaint:  " I have been feeling sad " Principal Diagnosis: <principal problem not specified> Diagnosis:   Patient Active Problem List   Diagnosis Date Noted  . MDD (major depressive disorder), single episode, severe with psychotic features ([place]) [F32.3] 07/31/2015   History of Present Illness::  34 year old female, states she had been feeling progressively more depressed  related at least partially to psychosocial and medical  stressors such as unemployment , having seizure disorder .  States that she impulsively overdosed on her sleeping medications - states took about 6 out of " a bunch that I had ".  States " at that moment I guess I just wanted to sleep, not think, not caring if I died ". This occurred 2 days ago.  States her SO realized she had overdosed and brought her to hospital.  At this time she states she is feeling better, regretting her overdose, stating " what I did was selfish, was not thinking how it would affect people". Patient states that she had been depressed and endorses some neuro-vegetative symptoms of depression as below. She had been residing in [facility] for several years and states that the people she was living with there were becoming more  hypercritical, non supportive, leading her to decide to relocate to [facility] a few weeks ago.   Associated Signs/Symptoms: Depression Symptoms:  depressed mood, insomnia, recurrent thoughts of death, anxiety, loss of energy/fatigue,   some degree of decreased sense of self esteem, but states all of these symptoms of depression now improving  (Hypo) Manic Symptoms:  Denies  Anxiety Symptoms:   Denies panic, denies agoraphobia Psychotic Symptoms:  Denies  PTSD Symptoms: Denies  Total Time spent with patient: 45 minutes  Past Psychiatric History:  Has had several psychiatric admissions, but  not recently, last time 2009.  Prior suicide attempt by ingesting bleach 2009. History of self cutting x 1 , 2015.   Denies history of mania, denies history of psychosis, denies history of violence . Does not currently have outpatient  Psychiatric care .   Risk to Self: Is patient at risk for suicide?: No Risk to Others:   Prior Inpatient Therapy:   Prior Outpatient Therapy:    Alcohol Screening: 1. How often do you have a drink containing alcohol?: Never 9. Have you or someone else been injured as a result of your drinking?: No 10. Has a relative or friend or a doctor or another health worker been concerned about your drinking or suggested you cut down?: No Alcohol Use Disorder Identification Test Final Score (AUDIT): 0 Brief Intervention: AUDIT score less than 7 or less-screening does not suggest unhealthy drinking-brief intervention not indicated Substance Abuse History in the last 12 months:   Denies drug abuse, denies alcohol abuse  Consequences of Substance Abuse: Denies  Previous Psychotropic Medications: remembers having been on Abilify, Depakote, but has not been on any medications for several years. States " Abilify really works". 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 History of pneumothorax ( spnotaneous?) a few years ago.  Allergic to PCn, Dilantin, Tramadol Past Medical History  Diagnosis Date  . Asthma   . Stroke ([place])   . Seizures ([place])     last seizure 07-2015    Past Surgical History  Procedure Laterality Date  . Umbilical hernia repair  12/2014   Family History:  Mother is alive , lives in  [place], father died from lung cancer two years ago, has three siblings  Family Psychiatric  History: no known history of mental illness in family, history of cannabis dependence and alcohol abuse in family, no suicides in family Social History: single, engaged , no children, lives with SO, currently unemployed .   History  Alcohol Use  . Yes    Comment: occ     History  Drug Use No    Social History   Social History  . Marital Status: Single    Spouse Name: N/A  . Number of Children: N/A  . Years of Education: N/A   Social History Main Topics  . Smoking status: Current Every Day Smoker  . Smokeless tobacco: None  . Alcohol Use: Yes     Comment: occ  . Drug Use: No  . Sexual Activity: Not Asked   Other Topics Concern  . None   Social History Narrative   Additional Social History:    Allergies:   Allergies  Allergen Reactions  . Chocolate Anaphylaxis  . Bee Venom Other (See Comments)    intolerance  . Garlic Other (See Comments)    intolerance  . Orange (Diagnostic) Swelling  . Oregano [Origanum Oil] Other (See Comments)    intolerance  . Penicillins Other (See Comments)    intolerance  . Tape Rash   Lab Results:  Results for orders placed or performed during the hospital encounter of 07/30/15 (from the past 48 hour(s))  I-Stat Beta hCG blood, ED ([facility], [facility], [facility] only)     Status: None   Collection Time: 07/30/15 12:11 PM  Result Value Ref Range   I-stat hCG, quantitative <5.0 <5 mIU/mL   Comment 3            Comment:   GEST. AGE      CONC.  (mIU/mL)   <=1 WEEK        5 - 50     2 WEEKS       50 - 500     3 WEEKS       100 - 10,000     4 WEEKS     1,000 - 30,000        FEMALE AND NON-PREGNANT FEMALE:     LESS THAN 5 mIU/mL   I-Stat CG4 Lactic Acid, ED     Status: None   Collection Time: 07/30/15 12:13 PM  Result Value Ref Range   Lactic Acid, Venous 1.41 0.5 - 2.0 mmol/L  Comprehensive metabolic panel     Status: Abnormal   Collection Time: 07/30/15 12:21 PM  Result Value Ref Range   Sodium 137 135 - 145 mmol/L   Potassium 3.9 3.5 - 5.1 mmol/L   Chloride 110 101 - 111 mmol/L   CO2 19 (L) 22 - 32 mmol/L   Glucose, Bld 89 65 - 99 mg/dL   BUN 11 6 - 20 mg/dL   Creatinine, Ser 0.89 0.44 - 1.00 mg/dL   Calcium 9.3 8.9 - 10.3 mg/dL   Total Protein 7.3 6.5 - 8.1  g/dL   Albumin 4.2 3.5 - 5.0 g/dL   AST 21 15 - 41 U/L   ALT 16 14 - 54 U/L   Alkaline Phosphatase 41 38 - 126 U/L   Total Bilirubin 0.9 0.3 - 1.2 mg/dL   GFR calc non Af Amer >60 >60 mL/min   GFR calc Af Amer >60 >60 mL/min    Comment: (NOTE)  The eGFR has been calculated using the CKD EPI equation. This calculation has not been validated in all clinical situations. eGFR's persistently <60 mL/min signify possible Chronic Kidney Disease.    Anion gap 8 5 - 15  Lipase, blood     Status: None   Collection Time: 07/30/15 12:21 PM  Result Value Ref Range   Lipase 31 11 - 51 U/L  CBC with Differential     Status: None   Collection Time: 07/30/15 12:21 PM  Result Value Ref Range   WBC 5.2 4.0 - 10.5 K/uL   RBC 3.91 3.87 - 5.11 MIL/uL   Hemoglobin 12.4 12.0 - 15.0 g/dL   HCT 36.4 36.0 - 46.0 %   MCV 93.1 78.0 - 100.0 fL   MCH 31.7 26.0 - 34.0 pg   MCHC 34.1 30.0 - 36.0 g/dL   RDW 12.7 11.5 - 15.5 %   Platelets 215 150 - 400 K/uL   Neutrophils Relative % 58 %   Neutro Abs 3.1 1.7 - 7.7 K/uL   Lymphocytes Relative 29 %   Lymphs Abs 1.5 0.7 - 4.0 K/uL   Monocytes Relative 9 %   Monocytes Absolute 0.5 0.1 - 1.0 K/uL   Eosinophils Relative 3 %   Eosinophils Absolute 0.2 0.0 - 0.7 K/uL   Basophils Relative 1 %   Basophils Absolute 0.0 0.0 - 0.1 K/uL  Urinalysis, Routine w reflex microscopic (not at [facility])     Status: None   Collection Time: 07/30/15 12:28 PM  Result Value Ref Range   Color, Urine YELLOW YELLOW   APPearance CLEAR CLEAR   Specific Gravity, Urine 1.010 1.005 - 1.030   pH 7.0 5.0 - 8.0   Glucose, UA NEGATIVE NEGATIVE mg/dL   Hgb urine dipstick NEGATIVE NEGATIVE   Bilirubin Urine NEGATIVE NEGATIVE   Ketones, ur NEGATIVE NEGATIVE mg/dL   Protein, ur NEGATIVE NEGATIVE mg/dL   Urobilinogen, UA 0.2 0.0 - 1.0 mg/dL   Nitrite NEGATIVE NEGATIVE   Leukocytes, UA NEGATIVE NEGATIVE    Comment: MICROSCOPIC NOT DONE ON URINES WITH NEGATIVE PROTEIN, BLOOD, LEUKOCYTES, NITRITE,  OR GLUCOSE <1000 mg/dL.  Urine rapid drug screen (hosp performed)     Status: Abnormal   Collection Time: 07/30/15 12:28 PM  Result Value Ref Range   Opiates NONE DETECTED NONE DETECTED   Cocaine NONE DETECTED NONE DETECTED   Benzodiazepines NONE DETECTED NONE DETECTED   Amphetamines NONE DETECTED NONE DETECTED   Tetrahydrocannabinol NONE DETECTED NONE DETECTED   Barbiturates POSITIVE (A) NONE DETECTED    Comment:        DRUG SCREEN FOR MEDICAL PURPOSES ONLY.  IF CONFIRMATION IS NEEDED FOR ANY PURPOSE, NOTIFY LAB WITHIN 5 DAYS.        LOWEST DETECTABLE LIMITS FOR URINE DRUG SCREEN Drug Class       Cutoff (ng/mL) Amphetamine      1000 Barbiturate      200 Benzodiazepine   038 Tricyclics       882 Opiates          300 Cocaine          300 THC              50   Acetaminophen level     Status: Abnormal   Collection Time: 07/30/15 12:36 PM  Result Value Ref Range   Acetaminophen (Tylenol), Serum <10 (L) 10 - 30 ug/mL    Comment:        THERAPEUTIC CONCENTRATIONS VARY SIGNIFICANTLY. A RANGE OF  10-30 ug/mL MAY BE AN EFFECTIVE CONCENTRATION FOR MANY PATIENTS. HOWEVER, SOME ARE BEST TREATED AT CONCENTRATIONS OUTSIDE THIS RANGE. ACETAMINOPHEN CONCENTRATIONS >150 ug/mL AT 4 HOURS AFTER INGESTION AND >50 ug/mL AT 12 HOURS AFTER INGESTION ARE OFTEN ASSOCIATED WITH TOXIC REACTIONS.   Salicylate level     Status: None   Collection Time: 07/30/15 12:36 PM  Result Value Ref Range   Salicylate Lvl <1.0 2.8 - 30.0 mg/dL  Ethanol     Status: None   Collection Time: 07/30/15 12:36 PM  Result Value Ref Range   Alcohol, Ethyl (B) <5 <5 mg/dL    Comment:        LOWEST DETECTABLE LIMIT FOR SERUM ALCOHOL IS 5 mg/dL FOR MEDICAL PURPOSES ONLY     Metabolic Disorder Labs:  No results found for: HGBA1C, MPG No results found for: PROLACTIN No results found for: CHOL, TRIG, HDL, CHOLHDL, VLDL, LDLCALC  Current Medications: Current Facility-Administered Medications  Medication Dose  Route Frequency Provider Last Rate Last Dose  . acetaminophen (TYLENOL) tablet 650 mg  650 mg Oral Q6H PRN Laverle Hobby, PA-C      . alum & mag hydroxide-simeth (MAALOX/MYLANTA) 200-200-20 MG/5ML suspension 30 mL  30 mL Oral Q4H PRN Laverle Hobby, PA-C      . doxepin (SINEQUAN) capsule 10 mg  10 mg Oral QHS PRN Laverle Hobby, PA-C   10 mg at 07/31/15 2250  . hydrOXYzine (ATARAX/VISTARIL) tablet 25 mg  25 mg Oral Q6H PRN Laverle Hobby, PA-C      . risperiDONE (RISPERDAL M-TABS) disintegrating tablet 2 mg  2 mg Oral Q8H PRN Laverle Hobby, PA-C       And  . LORazepam (ATIVAN) tablet 1 mg  1 mg Oral PRN Laverle Hobby, PA-C       And  . ziprasidone (GEODON) injection 20 mg  20 mg Intramuscular PRN Laverle Hobby, PA-C      . magnesium hydroxide (MILK OF MAGNESIA) suspension 30 mL  30 mL Oral Daily PRN Laverle Hobby, PA-C      . pantoprazole (PROTONIX) EC tablet 40 mg  40 mg Oral Daily Laverle Hobby, PA-C   40 mg at 08/01/15 0820  . topiramate (TOPAMAX) tablet 50 mg  50 mg Oral BID Jenne Campus, MD   50 mg at 08/01/15 0820   PTA Medications: Prescriptions prior to admission  Medication Sig Dispense Refill Last Dose  . butalbital-acetaminophen-caffeine (FIORICET, ESGIC) 50-325-40 MG tablet Take 2 tablets by mouth 2 (two) times a week.   Past Week at Unknown time  . Fluticasone Furoate-Vilanterol (BREO ELLIPTA) 200-25 MCG/INH AEPB Inhale 1 puff into the lungs daily.   07/29/2015 at Unknown time  . omeprazole (PRILOSEC) 20 MG capsule Take 20 mg by mouth daily.   07/29/2015 at Unknown time  . topiramate (TOPAMAX) 50 MG tablet Take 50 mg by mouth 2 (two) times daily.   07/29/2015 at Unknown time  . traZODone (DESYREL) 50 MG tablet Take 50 mg by mouth at bedtime.   07/29/2015 at Unknown time    Musculoskeletal: Strength & Muscle Tone: within normal limits Gait & Station: normal Patient leans: N/A  Psychiatric Specialty Exam: Physical Exam  Review of Systems  Constitutional:  Negative.   HENT: Negative.   Eyes: Negative.   Respiratory: Negative.   Cardiovascular: Negative.   Genitourinary: Negative.   Musculoskeletal: Negative.   Skin: Negative.   Neurological: Positive for seizures.  Endo/Heme/Allergies: Negative.   Psychiatric/Behavioral: Positive  for depression.  All other systems reviewed and are negative.   Blood pressure 110/72, pulse 82, temperature 98.2 F (36.8 C), temperature source Oral, resp. rate 18, height _0  (1.626 m), weight 134 lb (60.782 kg), last menstrual period 06/30/2015, SpO2 100 %.Body mass index is 22.99 kg/(m^2).  General Appearance: Well Groomed  Engineer, water::  Good  Speech:  Normal Rate  Volume:  Normal  Mood:  states she is feeling better today  Affect:  constricted but reactive   Thought Process:  Goal Directed and Linear  Orientation:  Other:  fully alert and attentive   Thought Content:  no thought disorder, linear   Suicidal Thoughts:  No- denies any current suicidal ideations, no self injurious ideations  Homicidal Thoughts:  No  Memory:  recent and remote ideations  Judgement:  Other:  improved   Insight:  improved   Psychomotor Activity:  Normal  Concentration:  Good  Recall:  Good  Fund of Knowledge:Good  Language: Good  Akathisia:  Negative  Handed:  Right  AIMS (if indicated):     Assets:  Communication Skills Desire for Improvement Resilience Social Support  ADL's:  Intact  Cognition: WNL  Sleep:  Number of Hours: 6.25     Treatment Plan Summary: Daily contact with patient to assess and evaluate symptoms and progress in treatment, Medication management, Plan inpatient admission and medications as below   Observation Level/Precautions:  15 minute checks  Laboratory:  as needed   Psychotherapy:  Groups, milieu  Medications:  Currently on  Topamax 50 mgrs BID.  Patient states Abilify is the most effective medication she has been on in the past- states she felt less depressed. Does not remember  having had side effects. Start Abilify  5 mgrs QDAY  As noted, patient states she had recently started Keppra for her seizure disorder. She states that she was not having side effects. I have, with patient's consent , confirmed Keppra dose with patient's SO.   Consultations:  As needed   Discharge Concerns:  -   Estimated LOS:- 4 days   Other:     I certify that inpatient services furnished can reasonably be expected to improve the patient's condition.   COBOS, FERNANDO 11/16/201611:46 AM

## 2015-08-01 NOTE — Progress Notes (Signed)
Adult Psychoeducational Group Note  Date:  08/01/2015 Time:  8:58 PM  Group Topic/Focus:  Wrap-Up Group:   The focus of this group is to help patients review their daily goal of treatment and discuss progress on daily workbooks.  Participation Level:  Active  Participation Quality:  Appropriate  Affect:  Appropriate  Cognitive:  Alert  Insight: Appropriate  Engagement in Group:  Engaged  Modes of Intervention:  Discussion  Additional Comments:  Patient stated she had a great day. Patient's goal for today was to put a smile on everyone's face. Patient met her goal. Patient stated she had a good time with her vistors. Patient stated she was going home Friday.   Khloei Spiker L Tyreon Frigon 08/01/2015, 8:58 PM

## 2015-08-01 NOTE — BHH Suicide Risk Assessment (Signed)
[facility] Admission Suicide Risk Assessment   Nursing information obtained from:   patient and chart Demographic factors:   34 year old female, lives with SO Current Mental Status:   see below Loss Factors:   upcoming court date, recently relocated to [facility] from [facility]. Historical Factors:   History of Seizure Disorder, history of Depression Risk Reduction Factors:   resilience, social support  Total Time spent with patient: 45 minutes Principal Problem:  Overdose  Diagnosis:   Patient Active Problem List   Diagnosis Date Noted  . MDD (major depressive disorder), single episode, severe with psychotic features ([place]) [F32.3] 07/31/2015     Continued Clinical Symptoms:  Alcohol Use Disorder Identification Test Final Score (AUDIT): 0 The "Alcohol Use Disorders Identification Test", Guidelines for Use in Primary Care, Second Edition.  World Pharmacologist [facility]). Score between 0-7:  no or low risk or alcohol related problems. Score between 8-15:  moderate risk of alcohol related problems. Score between 16-19:  high risk of alcohol related problems. Score 20 or above:  warrants further diagnostic evaluation for alcohol dependence and treatment.   CLINICAL FACTORS:   34 year old female, recently overdosed on medications, at this time states intent was not suicidal , but simply trying to obtain relief and getting some sleep. Describes stressors to include having seizure disorder, with increased frequency of seizures recently, and recent relocation from [facility] to [facility] .  = Psychiatric Specialty Exam: Physical Exam  ROS  Blood pressure 110/72, pulse 82, temperature 98.2 F (36.8 C), temperature source Oral, resp. rate 18, height 5\' 4"  (1.626 m), weight 134 lb (60.782 kg), last menstrual period 06/30/2015, SpO2 100 %.Body mass index is 22.99 kg/(m^2).   see admit note MSE                                                        COGNITIVE FEATURES THAT CONTRIBUTE TO RISK:   Closed-mindedness and Loss of executive function    SUICIDE RISK:   Moderate:  Frequent suicidal ideation with limited intensity, and duration, some specificity in terms of plans, no associated intent, good self-control, limited dysphoria/symptomatology, some risk factors present, and identifiable protective factors, including available and accessible social support.  PLAN OF CARE: Patient will be admitted to inpatient psychiatric unit for stabilization and safety. Will provide and encourage milieu participation. Provide medication management and maked adjustments as needed.  Will follow daily.    Medical Decision Making:  Established Problem, Stable/Improving (1), Review of Psycho-Social Stressors (1), Review or order clinical lab tests (1) and Review of New Medication or Change in Dosage (2)  I certify that inpatient services furnished can reasonably be expected to improve the patient's condition.   Chizaram Latino 08/01/2015, 5:46 PM

## 2015-08-01 NOTE — BHH Group Notes (Signed)
[place] LCSW Group Therapy 08/01/2015  1:15 PM Type of Therapy: Group Therapy Participation Level: Active  Participation Quality: Attentive, Sharing and Supportive  Affect: Appropriate  Cognitive: Alert and Oriented  Insight: Developing/Improving and Engaged  Engagement in Therapy: Developing/Improving and Engaged  Modes of Intervention: Clarification, Confrontation, Discussion, Education, Exploration, Limit-setting, Orientation, Problem-solving, Rapport Building, Art therapist, Socialization and Support  Summary of Progress/Problems: The topic for group today was emotional regulation. This group focused on both positive and negative emotion identification and allowed group members to process ways to identify feelings, regulate negative emotions, and find healthy ways to manage internal/external emotions. Group members were asked to reflect on a time when their reaction to an emotion led to a negative outcome and explored how alternative responses using emotion regulation would have benefited them. Group members were also asked to discuss a time when emotion regulation was utilized when a negative emotion was experienced. Patient identified experiencing feelings of "emptiness". She reports feeling unappreciated and used by others. She enjoys using coping skills such as art, singing, music. CSW and other group members provided patient with emotional support and encouragement.  Tilden Fossa, MSW, [place] [phone]

## 2015-08-01 NOTE — Progress Notes (Signed)
   D: Pt informed the Probation officer that she was "Ok, now". Stated, "her fiance and kids visited". However, pt stated the kids didn't want to leave after the visit was over. Stated "the security guard had to pull me back, because they didn't want to leave". Pt has no other questions or concerns.   A:  Support and encouragement was offered. 15 min checks continued for safety.  R: Pt remains safe.

## 2015-08-01 NOTE — Progress Notes (Signed)
D-  Patient has been attending groups, interacting with peers, patient is intrusive but easily redirected.  Patient's affect is bright and she denies SI/HI and AVH.  Patient reports she has been sleeping well and feels improved since her admission.  Patient states that she has a strong support system at home and she is looked forward to returning there.   A-  Assess patient for safety, offer medications as prescribed, talk with patient about the severity of drug overdose.  R-  Patient is able to contract for safety.

## 2015-08-02 DIAGNOSIS — F323 Major depressive disorder, single episode, severe with psychotic features: Principal | ICD-10-CM

## 2015-08-02 NOTE — BHH Group Notes (Signed)
Adult Psychoeducational Group Note  Date:  08/02/2015 Time:  9:13 PM  Group Topic/Focus:  Wrap-Up Group:   The focus of this group is to help patients review their daily goal of treatment and discuss progress on daily workbooks.  Participation Level:  Active  Participation Quality:  Appropriate  Affect:  Appropriate  Cognitive:  Appropriate  Insight: Appropriate  Engagement in Group:  Engaged  Modes of Intervention:  Discussion  Additional Comments:  Patient stated her goal was to maintain control of herself.  She had a visit with her fiancee.  She is discharging tomorrow.  She also got to talk to her boys today.  Victorino Sparrow A 08/02/2015, 9:13 PM

## 2015-08-02 NOTE — BHH Group Notes (Signed)
Interdisciplinary Treatment Plan Update (Adult) Date: 08/02/2015   Time Reviewed: 9:30 AM  Progress in Treatment: Attending groups: Yes Participating in groups: Yes Taking medication as prescribed: Yes Tolerating medication: Yes Family/Significant other contact made: No, CSW assessing for appropriate contacts Patient understands diagnosis: Yes Discussing patient identified problems/goals with staff: Yes Medical problems stabilized or resolved: Yes Denies suicidal/homicidal ideation: Yes Issues/concerns per patient self-inventory: Yes Other:  New problem(s) identified: N/A  Discharge Plan or Barriers: Home with outpatient services.    Reason for Continuation of Hospitalization:  Depression Anxiety Medication Stabilization   Comments: N/A  Estimated length of stay: 1-2 days   Patient is a 34 year old female admitted for suicide attempt with (6) trazadone pills. Patient lives in [place]. Patient will benefit from crisis stabilization, medication evaluation, group therapy, and psycho education in addition to case management for discharge planning. Patient and CSW reviewed pt's identified goals and treatment plan. Pt verbalized understanding and agreed to treatment plan.     Review of initial/current patient goals per problem list:  1. Goal(s): Patient will participate in aftercare plan   Met: Yes   Target date: 3-5 days post admission date   As evidenced by: Patient will participate within aftercare plan AEB aftercare provider and housing plan at discharge being identified.  11/17: Goal met: Patient plans to return home with outpatient services.    2. Goal (s): Patient will exhibit decreased depressive symptoms and suicidal ideations.   Met: Yes   Target date: 3-5 days post admission date   As evidenced by: Patient will utilize self rating of depression at 3 or below and demonstrate decreased signs of depression or be deemed stable for discharge by  MD.  11/17: Goal met. Patient rates depression at 0, denies SI.    3. Goal(s): Patient will demonstrate decreased signs and symptoms of anxiety.   Met: Yes   Target date: 3-5 days post admission date   As evidenced by: Patient will utilize self rating of anxiety at 3 or below and demonstrated decreased signs of anxiety, or be deemed stable for discharge by MD  11/17: Goal met. Patient rates anxiety at 0.     Attendees: Patient:    Family:    Physician: Dr. Parke Poisson; Dr. Sabra Heck 08/02/2015 9:30 AM  Nursing: Janann August, Mayra Neer, Grayland Ormond, RN 08/02/2015 9:30 AM  Clinical Social Worker: Tod Persia Riffey LCSWA 08/02/2015 9:30 AM  Other: Peri Maris, LCSWA, [place], LCSW 08/02/2015 9:30 AM  Other:  08/02/2015 9:30 AM  Other:  08/02/2015 9:30 AM  Other: Ricky Ala, NP 08/02/2015 9:30 AM             Scribe for Treatment Team:  Tilden Fossa, MSW, [place] [phone]

## 2015-08-02 NOTE — BHH Group Notes (Signed)
[employer] LCSW Group Therapy 08/02/2015 1:15 PM Type of Therapy: Group Therapy Participation Level: Active  Participation Quality: Attentive, Sharing and Supportive  Affect: Appropriate  Cognitive: Alert and Oriented  Insight: Developing/Improving and Engaged  Engagement in Therapy: Developing/Improving and Engaged  Modes of Intervention: Activity, Clarification, Confrontation, Discussion, Education, Exploration, Limit-setting, Orientation, Problem-solving, Rapport Building, Art therapist, Socialization and Support  Summary of Progress/Problems: Patient was attentive and engaged with speaker from [facility]. Patient was attentive to speaker while they shared their story of dealing with mental health and overcoming it. Patient expressed interest in their programs and services and received information on their agency. Patient processed ways they can relate to the speaker.   Tilden Fossa, MSW, [place] Worker [facility] [phone]
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
## 2015-08-02 NOTE — Progress Notes (Signed)
  D: Pt informed the writer that in addition to her fiance, her aunt and niece also visited. Stated that the visit went well, and she has plans for discharge on Friday. Stated, she plans to continue with treatment. Stated, "when I go home, they look up to me like I'm superman. I have on a cape". Encouraged pt to remind family that she is not superman, and is a regular person.  Pt also requested "the same sleep medicine" she received last night, stating "I slept good".  Pt has no other questions or concerns.    A:  Support and encouragement was offered. 15 min checks continued for safety.  R: Pt remains safe.

## 2015-08-02 NOTE — Progress Notes (Addendum)
Patient ID: Tina [name], female   DOB: Aug 31, 1981, 34 y.o.   MRN: [id number]   Pt currently presents with a flat affect and cooperative behavior. Per self inventory, pt rates depression, hopelessness and anxiety at a 0. Pt's daily goal is to "get energy" and they intend to do so by "to go home to be with my kids." Pt reports good sleep, a good appetite, good energy and good concentration. Pt seen interacting with pts in the milieu and talking on the phone. Pt does not attend morning group today. Pt asks writer "Am I pregnant? They said I wasn't but I'm nauseus in the morning and don't know why."   Pt provided with medications per providers orders. Pt's labs and vitals were monitored throughout the day. Pt supported emotionally and encouraged to express concerns and questions. Pt educated on medications and the benefits of taking medications with food.   Pt's safety ensured with 15 minute and environmental checks. Pt currently denies SI/HI and A/V hallucinations. Pt verbally agrees to seek staff if SI/HI or A/VH occurs and to consult with staff before acting on these thoughts. Will continue POC.

## 2015-08-02 NOTE — BHH Group Notes (Addendum)
The focus of this group is to educate the patient on the purpose and policies of crisis stabilization and provide a format to answer questions about their admission.  The group details unit policies and expectations of patients while admitted.  Patient did not attend 0900 nurse education orientation group this morning.  Patient stayed in bed.   

## 2015-08-02 NOTE — Progress Notes (Signed)
[facility] MD Progress Note  08/02/2015 10:53 AM Tina [name]  MRN:  [id number] Subjective:    Patient states she is feeling better and symptoms of depression are improved . States she slept well last night, states she is more optimistic and hoping for discharge home soon. States she had a good visit with family yesterday. Objective : I have discussed case with treatment team, and have met with patient . Currently presenting with improved mood and range of affect . No disruptive or agitated behaviors on unit. Visible on unit, going to groups, noted to be interactive and supportive towards peers . Planning on returning home after discharge . Denies medication side effects. Principal Problem: MDD (major depressive disorder), single episode, severe with psychotic features ([place]) Diagnosis:   Patient Active Problem List   Diagnosis Date Noted  . MDD (major depressive disorder), single episode, severe with psychotic features ([place]) [F32.3] 07/31/2015   Total Time spent with patient: 20 minutes   Past Medical History:  Past Medical History  Diagnosis Date  . Asthma   . Stroke ([place])   . Seizures ([place])     last seizure 07-2015    Past Surgical History  Procedure Laterality Date  . Umbilical hernia repair  12/2014   Family History: History reviewed. No pertinent family history.  Social History:  History  Alcohol Use  . Yes    Comment: occ     History  Drug Use No    Social History   Social History  . Marital Status: Single    Spouse Name: N/A  . Number of Children: N/A  . Years of Education: N/A   Social History Main Topics  . Smoking status: Current Every Day Smoker  . Smokeless tobacco: None  . Alcohol Use: Yes     Comment: occ  . Drug Use: No  . Sexual Activity: Not Asked   Other Topics Concern  . None   Social History Narrative   Additional Social History:   Sleep: Good  Appetite:  Good  Current Medications: Current Facility-Administered Medications   Medication Dose Route Frequency Provider Last Rate Last Dose  . acetaminophen (TYLENOL) tablet 650 mg  650 mg Oral Q6H PRN Laverle Hobby, PA-C      . albuterol (PROVENTIL HFA;VENTOLIN HFA) 108 (90 BASE) MCG/ACT inhaler 2 puff  2 puff Inhalation Q4H PRN Jenne Campus, MD   2 puff at 08/02/15 0752  . alum & mag hydroxide-simeth (MAALOX/MYLANTA) 200-200-20 MG/5ML suspension 30 mL  30 mL Oral Q4H PRN Laverle Hobby, PA-C      . ARIPiprazole (ABILIFY) tablet 5 mg  5 mg Oral Daily Myer Peer Alyssandra Hulsebus, MD   5 mg at 08/02/15 9774  . hydrOXYzine (ATARAX/VISTARIL) tablet 25 mg  25 mg Oral Q6H PRN Jenne Campus, MD      . levETIRAcetam (KEPPRA) tablet 500 mg  500 mg Oral BID Jenne Campus, MD   500 mg at 08/02/15 0752  . magnesium hydroxide (MILK OF MAGNESIA) suspension 30 mL  30 mL Oral Daily PRN Laverle Hobby, PA-C      . pantoprazole (PROTONIX) EC tablet 40 mg  40 mg Oral Daily Laverle Hobby, PA-C   40 mg at 08/02/15 0752  . topiramate (TOPAMAX) tablet 50 mg  50 mg Oral BID Jenne Campus, MD   50 mg at 08/02/15 1423    Lab Results: No results found for this or any previous visit (from the past 48 hour(s)).  Physical Findings: AIMS:  , ,  ,  ,    CIWA:    COWS:     Musculoskeletal: Strength & Muscle Tone: within normal limits Gait & Station: normal Patient leans: N/A  Psychiatric Specialty Exam: ROS no vomiting, no rash, no seizures or seizure like activity on unit   Blood pressure 111/74, pulse 77, temperature 98.1 F (36.7 C), temperature source Oral, resp. rate 16, height _0  (1.626 m), weight 134 lb (60.782 kg), last menstrual period 06/30/2015, SpO2 100 %.Body mass index is 22.99 kg/(m^2).  General Appearance: improved groomed   Eye Contact::  Good  Speech:  Normal Rate  Volume:  Normal  Mood:  improving, less depressed today  Affect:  Appropriate and fuller in range   Thought Process:  Linear  Orientation:  Full (Time, Place, and Person)  Thought Content:  denies  hallucinations, no delusions   Suicidal Thoughts:  No denies any suicidal or self injurious ideations   Homicidal Thoughts:  No  Memory:  recent and remote grossly intact   Judgement:  Other:  improving   Insight:  improving   Psychomotor Activity:  Normal  Concentration:  Good  Recall:  Good  Fund of Knowledge:Good  Language: Good  Akathisia:  Negative  Handed:  Right  AIMS (if indicated):     Assets:  Communication Skills Desire for Improvement Resilience  ADL's:  Intact  Cognition: WNL  Sleep:  Number of Hours: 6.5  Assessment - patient currently improving compared to her admission presentation, mood is improved, affect is fuller in range, no medication side effects at this time and looking forward to discharge soon as she continues to stabilize  Treatment Plan Summary: Daily contact with patient to assess and evaluate symptoms and progress in treatment, Medication management, Plan inpatient treatment  and medications as below  Continue Abilify 5 mgrs QDAY for mood disorder- as noted, patient states that prior Abilify trial was effective and well tolerated . Continue Keppra 545mrs BID  for history of seizure disorder  Continue Topamax 50 mgrs BID for same Continue Vistaril 25 mgrs Q 6 hours PRN for anxiety as needed  Encourage ongoing group participation to work on coping skills and symptom reduction  Juandavid Dallman, [place] Hi11/17/2016, 10:53 AM

## 2015-08-03 DIAGNOSIS — T1491XA Suicide attempt, initial encounter: Secondary | ICD-10-CM | POA: Insufficient documentation

## 2015-08-03 LAB — TRAZODONE (DESYREL), BLOOD: Trazodone Lvl: 67 ng/mL — ABNORMAL LOW (ref 800–1600)

## 2015-08-03 MED ORDER — TOPIRAMATE 50 MG PO TABS: 50.0000 mg | ORAL_TABLET | Freq: Two times a day (BID) | ORAL | Status: DC

## 2015-08-03 MED ORDER — HYDROXYZINE HCL 25 MG PO TABS: 25.0000 mg | ORAL_TABLET | Freq: Four times a day (QID) | ORAL | Status: DC | PRN

## 2015-08-03 MED ORDER — ARIPIPRAZOLE 5 MG PO TABS: 5.0000 mg | ORAL_TABLET | Freq: Every day | ORAL | Status: DC

## 2015-08-03 MED ORDER — LEVETIRACETAM 500 MG PO TABS
500.0000 mg | ORAL_TABLET | Freq: Two times a day (BID) | ORAL | Status: DC
Start: 2015-08-03 — End: 2015-09-12

## 2015-08-03 MED ORDER — OMEPRAZOLE 20 MG PO CPDR: 20.0000 mg | DELAYED_RELEASE_CAPSULE | Freq: Every day | ORAL | Status: DC

## 2015-08-03 MED ORDER — ALBUTEROL SULFATE HFA 108 (90 BASE) MCG/ACT IN AERS: 2.0000 | INHALATION_SPRAY | RESPIRATORY_TRACT | Status: DC | PRN

## 2015-08-03 NOTE — BHH Suicide Risk Assessment (Addendum)
[place] INPATIENT:  Family/Significant Other Suicide Prevention Education  Suicide Prevention Education:  Education Completed; Tina [name] [phone] has been identified by the patient as the family member/significant other with whom the patient will be residing, and identified as the person(s) who will aid the patient in the event of a mental health crisis (suicidal ideations/suicide attempt).  With written consent from the patient, the family member/significant other has been provided the following suicide prevention education, prior to the and/or following the discharge of the patient.  The suicide prevention education provided includes the following:  Suicide risk factors  Suicide prevention and interventions  National Suicide Hotline telephone number  [facility] assessment telephone number  [facility] Emergency Assistance [place] and/or Residential Mobile Crisis Unit telephone number  Request made of family/significant other to:  Remove weapons (e.g., guns, rifles, knives), all items previously/currently identified as safety concern.    Remove drugs/medications (over-the-counter, prescriptions, illicit drugs), all items previously/currently identified as a safety concern.  The family member/significant other verbalizes understanding of the suicide prevention education information provided.  The family member/significant other agrees to remove the items of safety concern listed above.  Tina [name] Tina [name] 08/03/2015, 10:58 AM

## 2015-08-03 NOTE — Plan of Care (Signed)
Problem: Diagnosis: Increased Risk For Suicide Attempt Goal: STG-Patient Will Attend All Groups On The Unit Outcome: Progressing Pt attended evening group on 08/02/15

## 2015-08-03 NOTE — Progress Notes (Addendum)
D: Pt has anxious affect and mood.  Upon initial shift assessment, pt was visibly irritated.  She walked with Probation officer to her room and reported "I'm trying to keep it cool."  Pt reports "they're picking on my room mate and it's making me mad."  Pt reports she is "leaving tomorrow to my fiance's house with my children."  She reports she "feels great" that she is discharging and adamantly insists that she feels safe to do so.  Pt denies SI/HI, denies hallucinations, denies pain.  Pt has been visible in milieu interacting with peers.  Pt attended evening group.  Pt complained of GI upset. A: Introduced self to pt.  Met with pt 1:1 and provided support and encouragement.  Actively listened to pt.  PRN medication administered for anxiety/sleep.  Ginger ale provided for GI upset; pt reported it was effective.  PRN medication administered for shortness of breath.   R: Pt is compliant with medications.  Pt verbally contracts for safety.  Will continue to monitor and assess.

## 2015-08-03 NOTE — BHH Suicide Risk Assessment (Signed)
[facility] Discharge Suicide Risk Assessment   Demographic Factors:  34 year old single female. Living with fiance   Total Time spent with patient: 30 minutes  Musculoskeletal: Strength & Muscle Tone: within normal limits Gait & Station: normal Patient leans: N/A  Psychiatric Specialty Exam: Physical Exam  ROS  Blood pressure 96/66, pulse 88, temperature 98.7 F (37.1 C), temperature source Oral, resp. rate 18, height 5\' 4"  (1.626 m), weight 134 lb (60.782 kg), last menstrual period 06/30/2015, SpO2 100 %.Body mass index is 22.99 kg/(m^2).  General Appearance: Well Groomed  Engineer, water::  Good  Speech:  Normal Rate409  Volume:  Normal  Mood:  Euthymic  Affect:  Full Range  Thought Process:  Linear  Orientation:  Full (Time, Place, and Person)  Thought Content:  denies hallucinations, no delusions   Suicidal Thoughts:  No denies any suicidal ideations, no  Self injurious ideations   Homicidal Thoughts:  No  Memory:  recent and remote grossly intact   Judgement:   Improved   Insight:  improved   Psychomotor Activity:  Normal  Concentration:  Good  Recall:  Good  Fund of Knowledge:Good  Language: Good  Akathisia:  Negative  Handed:  Right  AIMS (if indicated):     Assets:  Communication Skills Desire for Improvement Physical Health Resilience  Sleep:  Number of Hours: 6.75  Cognition: WNL  ADL's:  Intact      Has this patient used any form of tobacco in the last 30 days? (Cigarettes, Smokeless Tobacco, Cigars, and/or Pipes) Yes, A prescription for an FDA-approved tobacco cessation medication was offered at discharge and the patient refused  Mental Status Per Nursing Assessment::   On Admission:     Current Mental Status by Physician: At this time patient is improved compared to admission- mood improved,  Currently euthymic, affect full in range, no thought disorder, no SI, no HI, no psychotic symptoms, future oriented  Loss Factors: History of seizure disorder,  unemployment, recent relocation from [facility]   Historical Factors: History of seizure disorder , prior psychiatric admissions for depression  Risk Reduction Factors:   Sense of responsibility to family, Living with another person, especially a relative, Positive social support and Positive coping skills or problem solving skills  Continued Clinical Symptoms:  As noted, at this time much improved compared to admission, euthymic at present   Cognitive Features That Contribute To Risk:  No gross cognitive deficits noted upon discharge. Is alert , attentive, and oriented x 3    Suicide Risk:  Mild:  Suicidal ideation of limited frequency, intensity, duration, and specificity.  There are no identifiable plans, no associated intent, mild dysphoria and related symptoms, good self-control (both objective and subjective assessment), few other risk factors, and identifiable protective factors, including available and accessible social support.  Principal Problem: MDD (major depressive disorder), single episode, severe with psychotic features [facility]) Discharge Diagnoses:  Patient Active Problem List   Diagnosis Date Noted  . MDD (major depressive disorder), single episode, severe with psychotic features ([place]) [F32.3] 07/31/2015    Follow-up Information    Follow up with [facility].   Why:  Medication management appointment with Reginold Agent, N.P. on Friday December 2nd at 2pm.  Therapy appointment with Vidal Schwalbe on Monday Dec 5th at 11am.  Please allow two hours for initial therapy appointment.  Please call office if I need to reschedule     Contact information:   [place], [place] Ph: [phone])  [phone]      Plan Of Care/Follow-up recommendations:  Activity:  as tolerated  Diet:  Regular Tests:  NA Other:  see below  Is patient on multiple antipsychotic therapies at discharge:  No   Has Patient had three or more failed trials of antipsychotic  monotherapy by history:  No  Recommended Plan for Multiple Antipsychotic Therapies: NA  Patient is leaving unit in good spirits . Plans to return home - lives with fiance Follow up as above Plans to follow up with local neurologist for ongoing management of seizure disorder .- fiance is making appointment for her .  Lewis Keats 08/03/2015, 10:55 AM

## 2015-08-03 NOTE — Progress Notes (Addendum)
Md completed pt's DC order and DC SRA in her chart . She says she s ready for DC, she denies SI and documents this in her daily assessment this AM and then she rates her depression,, hopelessness and anxiety " 0/0/0", respectively.  SHe was given her DC AVS, its contents was reviewed with her and she stated understanding  and then she was escorted to bldg entrance and dc'd.

## 2015-08-03 NOTE — Progress Notes (Signed)
  [facility] Adult Case Management Discharge Plan :  Will you be returning to the same living situation after discharge:  Yes,  pt states she will return to live with girlfriend at former residence upon discharge At discharge, do you have transportation home?: Yes, pt states girlfriend is picking pt up by car Do you have the ability to pay for your medications: Yes,  pt states she has resources to pay for medications and pt will be provided with prescriptions upon discharge  Release of information consent forms completed and in the chart;  Patient's signature needed at discharge.  Patient to Follow up at: Follow-up Information    Follow up with [place] [facility].   Why:  Medication management appointment with Reginold Agent, N.P. on Friday December 2nd at 2pm.  Therapy appointment with Vidal Schwalbe on Monday Dec 5th at 11am.  Please allow two hours for initial therapy appointment.  Please call office if I need to reschedule     Contact information:   [place], [place]. [place] Ph: [phone]      Next level of care provider has access to [place]  Patient denies SI/HI: Yes,  Pt denies SI/HI    Safety Planning and Suicide Prevention discussed: Yes,  CSW explained to the pt, in detail, who is at risk and risk factors of SI/HI and also contacted pt's girlfriend with whom pt resides to explain risks and warning signs and resources to call if pt demonstrated warning signs of SI/HI     Has patient been referred to the Quitline?: N/A patient is not a smoker  Claudine Mouton 08/03/2015, 11:12 AM

## 2015-08-03 NOTE — Progress Notes (Signed)
[name] is OOB and dressed first thing this morning...she says " Im so excited.Marland KitchenMarland KitchenMarland KitchenIm ready to go home..." She completes her daily assessment and on it she wrote she denied SI today, she rated her depression, hopelessness and anxiety " 0/0/0/", respectively . She is currently working on rectifying her DC plans right now.   She attends her morning group and she interacts appropriately .

## 2015-08-03 NOTE — Discharge Summary (Signed)
Physician Discharge Summary Note  Patient:  Tina [name] is an 34 y.o., female MRN:  [id number] DOB:  1980/09/26 Patient phone:  [phone] (home)  Patient address:   [address]  [place] [address],  Total Time spent with patient: 45 minutes  Date of Admission:  07/31/2015 Date of Discharge: 08/03/2015  Reason for Admission:   History of Present Illness:34 year old female, states she had been feeling progressively more depressed related at least partially to psychosocial and medical stressors such as unemployment , having seizure disorder . States that she impulsively overdosed on her sleeping medications - states took about 6 out of " a bunch that I had ". States " at that moment I guess I just wanted to sleep, not think, not caring if I died ". This occurred 2 days ago. States her SO realized she had overdosed and brought her to hospital.At this time she states she is feeling better, regretting her overdose, stating " what I did was selfish, was not thinking how it would affect people". Patient states that she had been depressed and endorses some neuro-vegetative symptoms of depression as below. She had been residing in [facility] for several years and states that the people she was living with there were becoming more hypercritical, non supportive, leading her to decide to relocate to [facility] a few weeks ago.   Principal Problem: MDD (major depressive disorder), single episode, severe with psychotic features [facility]) Discharge Diagnoses: Patient Active Problem List   Diagnosis Date Noted  . MDD (major depressive disorder), single episode, severe with psychotic features ([place]) [F32.3] 07/31/2015     Past Medical History:  Past Medical History  Diagnosis Date  . Asthma   . Stroke ([place])   . Seizures ([place])     last seizure 07-2015    Past Surgical History  Procedure Laterality Date  . Umbilical hernia repair  12/2014   Family History: History reviewed. No pertinent  family history. Social History:  History  Alcohol Use  . Yes    Comment: occ     History  Drug Use No    Social History   Social History  . Marital Status: Single    Spouse Name: N/A  . Number of Children: N/A  . Years of Education: N/A   Social History Main Topics  . Smoking status: Current Every Day Smoker  . Smokeless tobacco: None  . Alcohol Use: Yes     Comment: occ  . Drug Use: No  . Sexual Activity: Not Asked   Other Topics Concern  . None   Social History Narrative    Hospital Course:   Tina [name] was admitted for MDD (major depressive disorder), single episode, severe with psychotic features ([place]),  and crisis management.  Pt was treated discharged with the medications listed below under Medication List.  Medical problems were identified and treated as needed.  Home medications were restarted as appropriate.  Improvement was monitored by observation and Tina [name] daily report of symptom reduction.  Emotional and mental status was monitored by daily self-inventory reports completed by Tina [name].         Tina [name] was evaluated by the treatment team for stability and plans for continued recovery upon discharge. Tina [name] 's motivation was an integral factor for scheduling further treatment. Employment, transportation, bed availability, health status, family support, and any pending legal issues were also considered during hospital stay. Pt was offered further  treatment options upon discharge including but not limited to Residential, Intensive Outpatient, and Outpatient treatment.  Tina [name] will follow up with the services as listed below under Follow Up Information.     Upon completion of this admission the patient was both mentally and medically stable for discharge denying suicidal/homicidal ideation, auditory/visual/tactile hallucinations, delusional  thoughts and paranoia.     Physical Findings: AIMS: Facial and Oral Movements Muscles of Facial Expression: None, normal Lips and Perioral Area: None, normal Jaw: None, normal Tongue: None, normal,Extremity Movements Upper (arms, wrists, hands, fingers): None, normal Lower (legs, knees, ankles, toes): None, normal, Trunk Movements Neck, shoulders, hips: None, normal, Overall Severity Severity of abnormal movements (highest score from questions above): None, normal Incapacitation due to abnormal movements: None, normal PatientLogan awareness of abnormal movements (rate only patientLogan report): No Awareness, Dental Status Current problems with teeth and/or dentures?: No Does patient usually wear dentures?: No  CIWA:    COWS:     Musculoskeletal: Strength & Muscle Tone: within normal limits Gait & Station: normal Patient leans: N/A  Psychiatric Specialty Exam: Review of Systems  Psychiatric/Behavioral: Positive for depression and substance abuse. Negative for suicidal ideas and hallucinations. The patient is nervous/anxious and has insomnia.   All other systems reviewed and are negative.   Blood pressure 96/66, pulse 88, temperature 98.7 F (37.1 C), temperature source Oral, resp. rate 18, height 5\' 4"  (1.626 m), weight 60.782 kg (134 lb), last menstrual period 06/30/2015, SpO2 100 %.Body mass index is 22.99 kg/(m^2).  SEE MD PSE within the [place]       Has this patient used any form of tobacco in the last 30 days? (Cigarettes, Smokeless Tobacco, Cigars, and/or Pipes) Yes, No  Metabolic Disorder Labs:  No results found for: HGBA1C, MPG No results found for: PROLACTIN No results found for: CHOL, TRIG, HDL, CHOLHDL, VLDL, LDLCALC  See Psychiatric Specialty Exam and Suicide Risk Assessment completed by Attending Physician prior to discharge.  Discharge destination:  Home  Is patient on multiple antipsychotic therapies at discharge:  No   Has Patient had three or more failed trials  of antipsychotic monotherapy by history:  No  Recommended Plan for Multiple Antipsychotic Therapies: NA     Medication List    STOP taking these medications        BREO ELLIPTA 200-25 MCG/INH Aepb  Generic drug:  Fluticasone Furoate-Vilanterol     butalbital-acetaminophen-caffeine 50-325-40 MG tablet  Commonly known as:  FIORICET, ESGIC     traZODone 50 MG tablet  Commonly known as:  DESYREL      TAKE these medications      Indication   albuterol 108 (90 BASE) MCG/ACT inhaler  Commonly known as:  PROVENTIL HFA;VENTOLIN HFA  Inhale 2 puffs into the lungs every 4 (four) hours as needed for wheezing or shortness of breath.   Indication:  Asthma     ARIPiprazole 5 MG tablet  Commonly known as:  ABILIFY  Take 1 tablet (5 mg total) by mouth daily.   Indication:  mood stabilization     hydrOXYzine 25 MG tablet  Commonly known as:  ATARAX/VISTARIL  Take 1 tablet (25 mg total) by mouth every 6 (six) hours as needed for anxiety.   Indication:  Anxiety Neurosis     levETIRAcetam 500 MG tablet  Commonly known as:  KEPPRA  Take 1 tablet (500 mg total) by mouth 2 (two) times daily.   Indication:  hx seizures     omeprazole 20 MG capsule  Commonly known as:  PRILOSEC  Take 1 capsule (20 mg total) by mouth daily.   Indication:  Gastroesophageal Reflux Disease     topiramate 50 MG tablet  Commonly known as:  TOPAMAX  Take 1 tablet (50 mg total) by mouth 2 (two) times daily.   Indication:  hx seizures           Follow-up Information    Follow up with [facility].   Why:  Medication management appointment with Reginold Agent, N.P. on Friday December 2nd at 2pm.  Therapy appointment with Vidal Schwalbe on Monday Dec 5th at 11am.  Please allow two hours for initial therapy appointment.  Please call office if I need to reschedule     Contact information:   [place], [place]. [place] Ph: [phone]      Follow-up recommendations:   Activity:  As tolerated Diet:  Heart healthy with low sodium.  Comments:   Take all medications as prescribed. Keep all follow-up appointments as scheduled.  Do not consume alcohol or use illegal drugs while on prescription medications. Report any adverse effects from your medications to your primary care provider promptly.  In the event of recurrent symptoms or worsening symptoms, call 911, a crisis hotline, or go to the nearest emergency department for evaluation.   Signed: Benjamine Mola, FNP-BC 08/03/2015, 10:36 AM  Patient [name], Suicide Assessment Completed.  Disposition Plan Reviewed

## 2015-08-03 NOTE — BHH Group Notes (Signed)
[facility] LCSW Aftercare Discharge Planning Group Note   08/03/2015 12:00 PM  Participation Quality:  Engaged and appropriate  Mood/Affect:  Appropriate  Depression Rating:  0  Anxiety Rating:  0  Thoughts of Suicide:  No Will you contract for safety?   Yes  Current AVH:  No  Plan for Discharge/Comments:  Pt referred to [facility] on 07/1815 for medication appt on 07/18/15 at 2pm and therapy appointment on Monday 08/20/15 at 11am.  Pt is returning home upon discharge.  Transportation Means: Girlfriend will provide transportation for pt  Supports:  Pt lists girlfriend as pt's primary support  Claudine Mouton, MSW, LCSWA, LCAS

## 2015-08-06 ENCOUNTER — Encounter (HOSPITAL_COMMUNITY): Payer: Self-pay | Admitting: *Deleted

## 2015-08-07 ENCOUNTER — Inpatient Hospital Stay (HOSPITAL_COMMUNITY)
Admission: AD | Admit: 2015-08-07 | Discharge: 2015-08-07 | Disposition: A | Discharge status: Home / Self Care | Payer: BLUE CROSS/BLUE SHIELD | Source: Ambulatory Visit | Attending: Family Medicine | Admitting: Family Medicine

## 2015-08-07 ENCOUNTER — Encounter (HOSPITAL_COMMUNITY): Payer: Self-pay | Admitting: *Deleted

## 2015-08-07 DIAGNOSIS — F17210 Nicotine dependence, cigarettes, uncomplicated: Secondary | ICD-10-CM | POA: Insufficient documentation

## 2015-08-07 DIAGNOSIS — N949 Unspecified condition associated with female genital organs and menstrual cycle: Secondary | ICD-10-CM | POA: Diagnosis not present

## 2015-08-07 DIAGNOSIS — R102 Pelvic and perineal pain: Secondary | ICD-10-CM

## 2015-08-07 DIAGNOSIS — Z880 Allergy status to penicillin: Secondary | ICD-10-CM | POA: Insufficient documentation

## 2015-08-07 DIAGNOSIS — Z8673 Personal history of transient ischemic attack (TIA), and cerebral infarction without residual deficits: Secondary | ICD-10-CM | POA: Insufficient documentation

## 2015-08-07 DIAGNOSIS — G8929 Other chronic pain: Secondary | ICD-10-CM | POA: Diagnosis not present

## 2015-08-07 DIAGNOSIS — R109 Unspecified abdominal pain: Secondary | ICD-10-CM | POA: Diagnosis present

## 2015-08-07 LAB — URINALYSIS, ROUTINE W REFLEX MICROSCOPIC
Bilirubin Urine: NEGATIVE
GLUCOSE, UA: NEGATIVE mg/dL
KETONES UR: 15 mg/dL — AB
LEUKOCYTES UA: NEGATIVE
NITRITE: NEGATIVE
PROTEIN: NEGATIVE mg/dL
Specific Gravity, Urine: 1.02 (ref 1.005–1.030)
pH: 7 (ref 5.0–8.0)

## 2015-08-07 LAB — URINE MICROSCOPIC-ADD ON: WBC, UA: NONE SEEN WBC/hpf (ref 0–5)

## 2015-08-07 LAB — WET PREP, GENITAL
Clue Cells Wet Prep HPF POC: NONE SEEN
Sperm: NONE SEEN
TRICH WET PREP: NONE SEEN
Yeast Wet Prep HPF POC: NONE SEEN

## 2015-08-07 LAB — CBC
HEMATOCRIT: 34.8 % — AB (ref 36.0–46.0)
Hemoglobin: 11.9 g/dL — ABNORMAL LOW (ref 12.0–15.0)
MCH: 31.6 pg (ref 26.0–34.0)
MCHC: 34.2 g/dL (ref 30.0–36.0)
MCV: 92.3 fL (ref 78.0–100.0)
Platelets: 262 10*3/uL (ref 150–400)
RBC: 3.77 MIL/uL — AB (ref 3.87–5.11)
RDW: 12.8 % (ref 11.5–15.5)
WBC: 8 10*3/uL (ref 4.0–10.5)

## 2015-08-07 LAB — POCT PREGNANCY, URINE: PREG TEST UR: NEGATIVE

## 2015-08-07 MED ORDER — KETOROLAC TROMETHAMINE 60 MG/2ML IM SOLN
60.0000 mg | Freq: Once | INTRAMUSCULAR | Status: AC
  Administered 2015-08-07: 60 mg via INTRAMUSCULAR
  Filled 2015-08-07: qty 2

## 2015-08-07 MED ORDER — IBUPROFEN 800 MG PO TABS: 800.0000 mg | ORAL_TABLET | Freq: Three times a day (TID) | ORAL | Status: DC | PRN

## 2015-08-07 NOTE — MAU Note (Signed)
PT SAYS SHE HAS STARTED   HAVING  RIGHT SIDED PAIN X1 YEAR  BUT BECAME  WORSE   YESTERDAY.     WAS TOLD IN [place] THAT    SHE HAD CYST  ON  HER OVARY.   NO MED  FOR  PAIN.       VOMITING  AND  DIARRHEA    STARTED  WITH ABD  PAIN IN ABD    THIS  AFTERNOON          S[place]  [place]OV[place] FROM  [place].   SO NO DR.

## 2015-08-07 NOTE — Discharge Instructions (Signed)

## 2015-08-07 NOTE — MAU Provider Note (Signed)
History     CSN: [id number]  Arrival date and time: 08/07/15 [id number]   First Provider Initiated Contact with Patient 08/07/15 0309      No chief complaint on file.  HPI Comments: LMP: 08/06/15   Abdominal Pain This is a new problem. The current episode started yesterday. The onset quality is gradual. The problem occurs constantly. The problem has been unchanged. The pain is located in the RLQ. The pain is at a severity of 10/10 (Patient states that she has daily 10/10 pain, and has for "years"). The quality of the pain is cramping. The abdominal pain does not radiate. Associated symptoms include nausea and vomiting. Pertinent negatives include no constipation, diarrhea, dysuria, fever or frequency. Nothing aggravates the pain. The pain is relieved by nothing. She has tried nothing for the symptoms.    Past Medical History  Diagnosis Date  . Epilepsy ([place])   . Asthma   . Stroke ([place])   . Seizures ([place])     last seizure 07-2015    Past Surgical History  Procedure Laterality Date  . Umbilical hernia repair  12/2014    History reviewed. No pertinent family history.  Social History  Substance Use Topics  . Smoking status: Current Every Day Smoker    Types: Cigarettes  . Smokeless tobacco: None  . Alcohol Use: Yes     Comment: occ    Allergies:  Allergies  Allergen Reactions  . Bee Venom Anaphylaxis and Swelling    Swelling of whole body   . Chocolate Anaphylaxis  . Mosquito (Diagnostic) Anaphylaxis and Swelling    Swelling of whole body   . Oregano [Origanum Oil] Swelling    Swelling of roof of mouth   . Bee Venom Other (See Comments)    intolerance  . Garlic Itching and Other (See Comments)    Makes eyes burn  . Garlic Other (See Comments)    intolerance  . Orange (Diagnostic) Swelling  . Orange Fruit [Citrus] Other (See Comments)    Makes tongue bleed   . Oregano [Origanum Oil] Other (See Comments)    intolerance  . Penicillins Hives    Has patient had a PCN  reaction causing immediate rash, facial/tongue/throat swelling, SOB or lightheadedness with hypotension: Yes Has patient had a PCN reaction causing severe rash involving mucus membranes or skin necrosis: Yes Has patient had a PCN reaction that required hospitalization Yes Has patient had a PCN reaction occurring within the last 10 years: No If all of the above answers are "NO", then may proceed with Cephalosporin use.   Marland Kitchen Penicillins Other (See Comments)    intolerance  . Tape   . Tape Rash    Prescriptions prior to admission  Medication Sig Dispense Refill Last Dose  . albuterol (PROVENTIL HFA;VENTOLIN HFA) 108 (90 BASE) MCG/ACT inhaler Inhale 2 puffs into the lungs every 4 (four) hours as needed for wheezing or shortness of breath. 1 Inhaler 1 08/06/2015 at Unknown time  . cetirizine (ZYRTEC) 10 MG tablet Take 10 mg by mouth daily as needed for allergies.    Past Week at Unknown time  . levETIRAcetam (KEPPRA) 500 MG tablet Take 1 tablet (500 mg total) by mouth 2 (two) times daily. 60 tablet 0 08/06/2015 at Unknown time  . levETIRAcetam (KEPPRA) 500 MG tablet Take 1 tablet (500 mg total) by mouth 2 (two) times daily. 60 tablet 0 08/06/2015 at Unknown time  . omeprazole (PRILOSEC) 20 MG capsule Take 1 capsule (20 mg total) by mouth  daily.   08/06/2015 at Unknown time  . topiramate (TOPAMAX) 50 MG tablet Take 1 tablet (50 mg total) by mouth 2 (two) times daily. 60 tablet 1 08/06/2015 at Unknown time  . topiramate (TOPAMAX) 50 MG tablet Take 1 tablet (50 mg total) by mouth 2 (two) times daily. 60 tablet 0 08/06/2015 at Unknown time  . traZODone (DESYREL) 50 MG tablet Take 50 mg by mouth at bedtime.   Past Week at Unknown time  . albuterol (PROVENTIL HFA;VENTOLIN HFA) 108 (90 BASE) MCG/ACT inhaler Inhale 2 puffs into the lungs every 4 (four) hours as needed for wheezing or shortness of breath.   07/29/2015 at Unknown time  . ARIPiprazole (ABILIFY) 5 MG tablet Take 1 tablet (5 mg total) by mouth  daily. 30 tablet 0 08/02/2015  . Fluticasone Furoate-Vilanterol (BREO ELLIPTA) 200-25 MCG/INH AEPB Inhale 1 puff into the lungs daily after breakfast.   07/29/2015 at Unknown time  . hydrOXYzine (ATARAX/VISTARIL) 25 MG tablet Take 1 tablet (25 mg total) by mouth every 6 (six) hours as needed for anxiety. 30 tablet 0 08/02/2015  . omeprazole (PRILOSEC) 20 MG capsule Take 20 mg by mouth daily.   07/29/2015 at Unknown time    Review of Systems  Constitutional: Negative for fever.  Gastrointestinal: Positive for nausea, vomiting and abdominal pain. Negative for diarrhea and constipation.  Genitourinary: Negative for dysuria, urgency and frequency.   Physical Exam   Blood pressure 116/80, pulse 80, temperature 98 F (36.7 C), temperature source Oral, resp. rate 20, height 5\' 3"  (1.6 m), weight 60.952 kg (134 lb 6 oz), last menstrual period 08/06/2015.  Physical Exam  Nursing note and vitals reviewed. Constitutional: She is oriented to person, place, and time. She appears well-developed and well-nourished. No distress.  HENT:  Head: Normocephalic.  Cardiovascular: Normal rate.   Respiratory: Effort normal.  GI: Soft. There is no tenderness. There is no rebound.  Genitourinary:  External: no lesion Vagina: small amount of blood seen  Cervix: pink, smooth, no CMT Uterus: NSSC Adnexa: NT   Neurological: She is alert and oriented to person, place, and time.  Skin: Skin is warm and dry.  Psychiatric: She has a normal mood and affect.   Results for orders placed or performed during the hospital encounter of 08/07/15 (from the past 24 hour(s))  Urinalysis, Routine w reflex microscopic (not at [facility])     Status: Abnormal   Collection Time: 08/07/15  2:45 AM  Result Value Ref Range   Color, Urine YELLOW YELLOW   APPearance CLEAR CLEAR   Specific Gravity, Urine 1.020 1.005 - 1.030   pH 7.0 5.0 - 8.0   Glucose, UA NEGATIVE NEGATIVE mg/dL   Hgb urine dipstick MODERATE (A) NEGATIVE    Bilirubin Urine NEGATIVE NEGATIVE   Ketones, ur 15 (A) NEGATIVE mg/dL   Protein, ur NEGATIVE NEGATIVE mg/dL   Nitrite NEGATIVE NEGATIVE   Leukocytes, UA NEGATIVE NEGATIVE  Urine microscopic-add on     Status: Abnormal   Collection Time: 08/07/15  2:45 AM  Result Value Ref Range   Squamous Epithelial / LPF 0-5 (A) NONE SEEN   WBC, UA NONE SEEN 0 - 5 WBC/hpf   RBC / HPF 0-5 0 - 5 RBC/hpf   Bacteria, UA FEW (A) NONE SEEN   Urine-Other MUCOUS PRESENT   Pregnancy, urine POC     Status: None   Collection Time: 08/07/15  2:51 AM  Result Value Ref Range   Preg Test, Ur NEGATIVE NEGATIVE  Wet prep,  genital     Status: Abnormal   Collection Time: 08/07/15  3:15 AM  Result Value Ref Range   Yeast Wet Prep HPF POC NONE SEEN NONE SEEN   Trich, Wet Prep NONE SEEN NONE SEEN   Clue Cells Wet Prep HPF POC NONE SEEN NONE SEEN   WBC, Wet Prep HPF POC MODERATE (A) NONE SEEN   Sperm NONE SEEN   CBC     Status: Abnormal   Collection Time: 08/07/15  3:58 AM  Result Value Ref Range   WBC 8.0 4.0 - 10.5 K/uL   RBC 3.77 (L) 3.87 - 5.11 MIL/uL   Hemoglobin 11.9 (L) 12.0 - 15.0 g/dL   HCT 34.8 (L) 36.0 - 46.0 %   MCV 92.3 78.0 - 100.0 fL   MCH 31.6 26.0 - 34.0 pg   MCHC 34.2 30.0 - 36.0 g/dL   RDW 12.8 11.5 - 15.5 %   Platelets 262 150 - 400 K/uL     MAU Course  Procedures  MDM Patient has had toradol here Given chronic nature of pain, and onset of menses coinciding with the onset of the pain, will evaluate with outpatient [place].  Assessment and Plan   1. Chronic pelvic pain in female    DC home Comfort measures reviewed  Outpatient pelvic US ordered RX: ibuprofen 800mg  PRN #30  Return to MAU as needed FU with OB as planned  Follow-up Information    Follow up with [facility].   Specialty:  Obstetrics and Gynecology   Why:  they will call you with an appointment    Contact information:   [place] [phone]      Follow up  with [facility].   Specialty:  Radiology   Why:  they will call you with an ultrasound appointment    Contact information:   [address] [id number] [place] [phone]        Mathis Bud 08/07/2015, 4:06 AM

## 2015-08-08 ENCOUNTER — Encounter: Payer: Self-pay | Admitting: Obstetrics and Gynecology

## 2015-08-08 LAB — GC/CHLAMYDIA PROBE AMP (~~LOC~~) NOT AT ARMC
Chlamydia: NEGATIVE
Neisseria Gonorrhea: NEGATIVE

## 2015-08-12 ENCOUNTER — Encounter (HOSPITAL_COMMUNITY): Payer: Self-pay | Admitting: *Deleted

## 2015-08-12 ENCOUNTER — Emergency Department (HOSPITAL_COMMUNITY)
Admission: EM | Admit: 2015-08-12 | Discharge: 2015-08-12 | Disposition: A | Discharge status: Home / Self Care | Payer: BLUE CROSS/BLUE SHIELD | Attending: Emergency Medicine | Admitting: Emergency Medicine

## 2015-08-12 DIAGNOSIS — R569 Unspecified convulsions: Secondary | ICD-10-CM | POA: Diagnosis present

## 2015-08-12 DIAGNOSIS — Z3202 Encounter for pregnancy test, result negative: Secondary | ICD-10-CM | POA: Diagnosis not present

## 2015-08-12 DIAGNOSIS — F17210 Nicotine dependence, cigarettes, uncomplicated: Secondary | ICD-10-CM | POA: Diagnosis not present

## 2015-08-12 DIAGNOSIS — Z880 Allergy status to penicillin: Secondary | ICD-10-CM | POA: Insufficient documentation

## 2015-08-12 DIAGNOSIS — Z79899 Other long term (current) drug therapy: Secondary | ICD-10-CM | POA: Diagnosis not present

## 2015-08-12 DIAGNOSIS — J45909 Unspecified asthma, uncomplicated: Secondary | ICD-10-CM | POA: Diagnosis not present

## 2015-08-12 DIAGNOSIS — Z7951 Long term (current) use of inhaled steroids: Secondary | ICD-10-CM | POA: Insufficient documentation

## 2015-08-12 DIAGNOSIS — Z8673 Personal history of transient ischemic attack (TIA), and cerebral infarction without residual deficits: Secondary | ICD-10-CM | POA: Insufficient documentation

## 2015-08-12 DIAGNOSIS — R1032 Left lower quadrant pain: Secondary | ICD-10-CM | POA: Diagnosis not present

## 2015-08-12 LAB — CBC WITH DIFFERENTIAL/PLATELET
BASOS PCT: 0 %
Basophils Absolute: 0 10*3/uL (ref 0.0–0.1)
EOS ABS: 0.1 10*3/uL (ref 0.0–0.7)
EOS PCT: 2 %
HCT: 35.5 % — ABNORMAL LOW (ref 36.0–46.0)
HEMOGLOBIN: 12.1 g/dL (ref 12.0–15.0)
LYMPHS ABS: 3 10*3/uL (ref 0.7–4.0)
Lymphocytes Relative: 41 %
MCH: 31.3 pg (ref 26.0–34.0)
MCHC: 34.1 g/dL (ref 30.0–36.0)
MCV: 91.7 fL (ref 78.0–100.0)
Monocytes Absolute: 0.4 10*3/uL (ref 0.1–1.0)
Monocytes Relative: 6 %
NEUTROS PCT: 51 %
Neutro Abs: 3.8 10*3/uL (ref 1.7–7.7)
PLATELETS: 300 10*3/uL (ref 150–400)
RBC: 3.87 MIL/uL (ref 3.87–5.11)
RDW: 12.8 % (ref 11.5–15.5)
WBC: 7.3 10*3/uL (ref 4.0–10.5)

## 2015-08-12 LAB — COMPREHENSIVE METABOLIC PANEL
ALT: 22 U/L (ref 14–54)
ANION GAP: 9 (ref 5–15)
AST: 29 U/L (ref 15–41)
Albumin: 4.4 g/dL (ref 3.5–5.0)
Alkaline Phosphatase: 49 U/L (ref 38–126)
BILIRUBIN TOTAL: 0.7 mg/dL (ref 0.3–1.2)
BUN: 11 mg/dL (ref 6–20)
CHLORIDE: 113 mmol/L — AB (ref 101–111)
CO2: 23 mmol/L (ref 22–32)
Calcium: 8.9 mg/dL (ref 8.9–10.3)
Creatinine, Ser: 0.83 mg/dL (ref 0.44–1.00)
Glucose, Bld: 87 mg/dL (ref 65–99)
POTASSIUM: 3.4 mmol/L — AB (ref 3.5–5.1)
SODIUM: 145 mmol/L (ref 135–145)
TOTAL PROTEIN: 7.5 g/dL (ref 6.5–8.1)

## 2015-08-12 LAB — POC URINE PREG, ED: PREG TEST UR: NEGATIVE

## 2015-08-12 LAB — RAPID URINE DRUG SCREEN, HOSP PERFORMED
AMPHETAMINES: NOT DETECTED
BENZODIAZEPINES: NOT DETECTED
Barbiturates: NOT DETECTED
Cocaine: NOT DETECTED
OPIATES: NOT DETECTED
Tetrahydrocannabinol: POSITIVE — AB

## 2015-08-12 LAB — CBG MONITORING, ED: GLUCOSE-CAPILLARY: 81 mg/dL (ref 65–99)

## 2015-08-12 LAB — ETHANOL: ALCOHOL ETHYL (B): 109 mg/dL — AB (ref <5)

## 2015-08-12 LAB — LIPASE, BLOOD: LIPASE: 34 U/L (ref 11–51)

## 2015-08-12 MED ORDER — SODIUM CHLORIDE 0.9 % IV SOLN
1000.0000 mg | Freq: Once | INTRAVENOUS | Status: AC
  Administered 2015-08-12: 1000 mg via INTRAVENOUS
  Filled 2015-08-12: qty 10

## 2015-08-12 NOTE — Discharge Instructions (Signed)
Epilepsy °Epilepsy is a disorder in which a person has repeated seizures over time. A seizure is a release of abnormal electrical activity in the brain. Seizures can cause a change in attention, behavior, or the ability to remain awake and alert (altered mental status). Seizures often involve uncontrollable shaking (convulsions).  °Most people with epilepsy lead normal lives. However, people with epilepsy are at an increased risk of falls, accidents, and injuries. Therefore, it is important to begin treatment right away. °CAUSES  °Epilepsy has many possible causes. Anything that disturbs the normal pattern of brain cell activity can lead to seizures. This may include:  °· Head injury. °· Birth trauma. °· High fever as a child. °· Stroke. °· Bleeding into or around the brain. °· Certain drugs. °· Prolonged low oxygen, such as what occurs after CPR efforts. °· Abnormal brain development. °· Certain illnesses, such as meningitis, encephalitis (brain infection), malaria, and other infections. °· An imbalance of nerve signaling chemicals (neurotransmitters).   °SIGNS AND SYMPTOMS  °The symptoms of a seizure can vary greatly from one person to another. Right before a seizure, you may have a warning (aura) that a seizure is about to occur. An aura may include the following symptoms: °· Fear or anxiety. °· Nausea. °· Feeling like the room is spinning (vertigo). °· Vision changes, such as seeing flashing lights or spots. °Common symptoms during a seizure include: °· Abnormal sensations, such as an abnormal smell or a bitter taste in the mouth.   °· Sudden, general body stiffness.   °· Convulsions that involve rhythmic jerking of the face, arm, or leg on one or both sides.   °· Sudden change in consciousness.   °¨ Appearing to be awake but not responding.   °¨ Appearing to be asleep but cannot be awakened.   °· Grimacing, chewing, lip smacking, drooling, tongue biting, or loss of bowel or bladder control. °After a seizure,  you may feel sleepy for a while.  °DIAGNOSIS  °Your health care provider will ask about your symptoms and take a medical history. Descriptions from any witnesses to your seizures will be very helpful in the diagnosis. A physical exam, including a detailed neurological exam, is necessary. Various tests may be done, such as:  °· An electroencephalogram (EEG). This is a painless test of your brain waves. In this test, a diagram is created of your brain waves. These diagrams can be interpreted by a specialist. °· An MRI of the brain.   °· A CT scan of the brain.   °· A spinal tap (lumbar puncture, LP). °· Blood tests to check for signs of infection or abnormal blood chemistry. °TREATMENT  °There is no cure for epilepsy, but it is generally treatable. Once epilepsy is diagnosed, it is important to begin treatment as soon as possible. For most people with epilepsy, seizures can be controlled with medicines. The following may also be used: °· A pacemaker for the brain (vagus nerve stimulator) can be used for people with seizures that are not well controlled by medicine. °· Surgery on the brain. °For some people, epilepsy eventually goes away. °HOME CARE INSTRUCTIONS  °· Follow your health care provider's recommendations on driving and safety in normal activities. °· Get enough rest. Lack of sleep can cause seizures. °· Only take over-the-counter or prescription medicines as directed by your health care provider. Take any prescribed medicine exactly as directed. °· Avoid any known triggers of your seizures. °· Keep a seizure diary. Record what you recall about any seizure, especially any possible trigger.   °· Make   sure the people you live and work with know that you are prone to seizures. They should receive instructions on how to help you. In general, a witness to a seizure should:   °¨ Cushion your head and body.   °¨ Turn you on your side.   °¨ Avoid unnecessarily restraining you.   °¨ Not place anything inside your  mouth.   °¨ Call for emergency medical help if there is any question about what has occurred.   °· Follow up with your health care provider as directed. You may need regular blood tests to monitor the levels of your medicine.   °SEEK MEDICAL CARE IF:  °· You develop signs of infection or other illness. This might increase the risk of a seizure.   °· You seem to be having more frequent seizures.   °· Your seizure pattern is changing.   °SEEK IMMEDIATE MEDICAL CARE IF:  °· You have a seizure that does not stop after a few moments.   °· You have a seizure that causes any difficulty in breathing.   °· You have a seizure that results in a very severe headache.   °· You have a seizure that leaves you with the inability to speak or use a part of your body.   °  °This information is not intended to replace advice given to you by your health care provider. Make sure you discuss any questions you have with your health care provider. °  °Document Released: 09/01/2005 Document Revised: 06/22/2013 Document Reviewed: 04/13/2013 °Elsevier Interactive Patient Education ©2016 [employer] ° ° °Seizure, Adult °A seizure is abnormal electrical activity in the brain. Seizures usually last from 30 seconds to 2 minutes. There are various types of seizures. °Before a seizure, you may have a warning sensation (aura) that a seizure is about to occur. An aura may include the following symptoms:  °· Fear or anxiety. °· Nausea. °· Feeling like the room is spinning (vertigo). °· Vision changes, such as seeing flashing lights or spots. °Common symptoms during a seizure include: °· A change in attention or behavior (altered mental status). °· Convulsions with rhythmic jerking movements. °· Drooling. °· Rapid eye movements. °· Grunting. °· Loss of bladder and bowel control. °· Bitter taste in the mouth. °· Tongue biting. °After a seizure, you may feel confused and sleepy. You may also have an injury resulting from convulsions during the  seizure. °HOME CARE INSTRUCTIONS  °· If you are given medicines, take them exactly as prescribed by your health care provider. °· Keep all follow-up appointments as directed by your health care provider. °· Do not swim or drive or engage in risky activity during which a seizure could cause further injury to you or others until your health care provider says it is OK. °· Get adequate rest. °· Teach friends and family what to do if you have a seizure. They should: °¨ Lay you on the ground to prevent a fall. °¨ Put a cushion under your head. °¨ Loosen any tight clothing around your neck. °¨ Turn you on your side. If vomiting occurs, this helps keep your airway clear. °¨ Stay with you until you recover. °¨ Know whether or not you need emergency care. °SEEK IMMEDIATE MEDICAL CARE IF: °· The seizure lasts longer than 5 minutes. °· The seizure is severe or you do not wake up immediately after the seizure. °· You have an altered mental status after the seizure. °· You are having more frequent or worsening seizures. °Someone should drive you to the emergency department or call local emergency   services (911 in [place] °MAKE SURE YOU: °· Understand these instructions. °· Will watch your condition. °· Will get help right away if you are not doing well or get worse. °  °This information is not intended to replace advice given to you by your health care provider. Make sure you discuss any questions you have with your health care provider. °  °Document Released: 08/29/2000 Document Revised: 09/22/2014 Document Reviewed: 04/13/2013 °Elsevier Interactive Patient Education ©2016 [employer] ° °

## 2015-08-12 NOTE — ED Notes (Signed)
Per EMS patient family states she has had 4 seizures prior to there arrival.  EMS witnessed a 15 sec grand mal seizure.

## 2015-08-12 NOTE — ED Notes (Addendum)
Patient had a grand mal seizure witnessed by family and NT. MD made aware

## 2015-08-12 NOTE — ED Provider Notes (Signed)
CSN: [id number]     Arrival date & time 08/12/15  1411 History   First MD Initiated Contact with Patient 08/12/15 1454     Chief Complaint  Patient presents with  . Seizures     (Consider location/radiation/quality/duration/timing/severity/associated sxs/prior Treatment) Patient is a 34 y.o. female presenting with seizures.  Seizures Seizure activity on arrival: no   Seizure type:  Grand mal Preceding symptoms: aura   Preceding symptoms comment:  Abd pain Initial focality:  None Episode characteristics: confusion and generalized shaking   Episode characteristics: no abnormal movements   Postictal symptoms: confusion   Return to baseline: yes   Severity:  Moderate Duration:  1 minute Timing:  Clustered Progression:  Unchanged Context comment:  Ran out of keppra Recent head injury:  No recent head injuries PTA treatment:  None History of seizures: yes   Similar to previous episodes: yes     Past Medical History  Diagnosis Date  . Epilepsy ([place])   . Asthma   . Stroke ([place])   . Seizures ([place])     last seizure 07-2015   Past Surgical History  Procedure Laterality Date  . Umbilical hernia repair  12/2014   Family History  Problem Relation Age of Onset  . Heart failure Mother   . Cancer Father   . Seizures Father    Social History  Substance Use Topics  . Smoking status: Current Every Day Smoker -- 0.50 packs/day    Types: Cigarettes  . Smokeless tobacco: None  . Alcohol Use: 0.6 oz/week    1 Cans of beer per week     Comment: occ   OB History    Gravida Para Term Preterm AB TAB SAB Ectopic Multiple Living   0 0 0 0 0 0 0 0       Review of Systems  Neurological: Positive for seizures.  All other systems reviewed and are negative.     Allergies  Bee venom; Chocolate; Mosquito (diagnostic); Oregano; Bee venom; Garlic; Garlic; Orange (diagnostic); Orange fruit; Oregano; Penicillins; Penicillins; Tape; and Tape  Home Medications   Prior to Admission  medications   Medication Sig Start Date End Date Taking? Authorizing Provider  albuterol (PROVENTIL HFA;VENTOLIN HFA) 108 (90 BASE) MCG/ACT inhaler Inhale 2 puffs into the lungs every 4 (four) hours as needed for wheezing or shortness of breath.   Yes Historical Provider, MD  albuterol (PROVENTIL HFA;VENTOLIN HFA) 108 (90 BASE) MCG/ACT inhaler Inhale 2 puffs into the lungs every 4 (four) hours as needed for wheezing or shortness of breath. 08/03/15  Yes Benjamine Mola, FNP  ARIPiprazole (ABILIFY) 5 MG tablet Take 1 tablet (5 mg total) by mouth daily. 08/03/15  Yes Benjamine Mola, FNP  Fluticasone Furoate-Vilanterol (BREO ELLIPTA) 200-25 MCG/INH AEPB Inhale 1 puff into the lungs daily after breakfast.   Yes Historical Provider, MD  hydrOXYzine (ATARAX/VISTARIL) 25 MG tablet Take 1 tablet (25 mg total) by mouth every 6 (six) hours as needed for anxiety. 08/03/15  Yes Benjamine Mola, FNP  levETIRAcetam (KEPPRA) 500 MG tablet Take 1 tablet (500 mg total) by mouth 2 (two) times daily. 08/03/15  Yes Benjamine Mola, FNP  omeprazole (PRILOSEC) 20 MG capsule Take 1 capsule (20 mg total) by mouth daily. 08/03/15  Yes Benjamine Mola, FNP  topiramate (TOPAMAX) 50 MG tablet Take 1 tablet (50 mg total) by mouth 2 (two) times daily. 08/03/15  Yes Benjamine Mola, FNP  traZODone (DESYREL) 50 MG tablet Take 50 mg by  mouth at bedtime.   Yes Historical Provider, MD  cetirizine (ZYRTEC) 10 MG tablet Take 10 mg by mouth daily as needed for allergies.     Historical Provider, MD  ibuprofen (ADVIL,MOTRIN) 800 MG tablet Take 1 tablet (800 mg total) by mouth every 8 (eight) hours as needed. Patient not taking: Reported on 08/12/2015 08/07/15   Tresea Mall, CNM   BP 124/90 mmHg  Pulse 84  Temp(Src) 98.1 F (36.7 C) (Oral)  Resp 18  SpO2 100%  LMP 08/06/2015 Physical Exam  Constitutional: She is oriented to person, place, and time. She appears well-developed and well-nourished.  HENT:  Head: Normocephalic and  atraumatic.  Right Ear: External ear normal.  Left Ear: External ear normal.  Eyes: Conjunctivae and EOM are normal. Pupils are equal, round, and reactive to light.  Neck: Normal range of motion. Neck supple.  Cardiovascular: Normal rate, regular rhythm, normal heart sounds and intact distal pulses.   Pulmonary/Chest: Effort normal and breath sounds normal.  Abdominal: Soft. Bowel sounds are normal. There is tenderness in the periumbilical area and left lower quadrant.  Musculoskeletal: Normal range of motion.  Neurological: She is alert and oriented to person, place, and time. She has normal strength and normal reflexes. No cranial nerve deficit or sensory deficit. GCS eye subscore is 4. GCS verbal subscore is 5. GCS motor subscore is 6.  Skin: Skin is warm and dry.  Vitals reviewed.   ED Course  Procedures (including critical care time) Labs Review Labs Reviewed  CBC WITH DIFFERENTIAL/PLATELET - Abnormal; Notable for the following:    HCT 35.5 (*)    All other components within normal limits  COMPREHENSIVE METABOLIC PANEL - Abnormal; Notable for the following:    Potassium 3.4 (*)    Chloride 113 (*)    All other components within normal limits  URINE RAPID DRUG SCREEN, HOSP PERFORMED - Abnormal; Notable for the following:    Tetrahydrocannabinol POSITIVE (*)    All other components within normal limits  ETHANOL - Abnormal; Notable for the following:    Alcohol, Ethyl (B) 109 (*)    All other components within normal limits  LIPASE, BLOOD  CBG MONITORING, ED  POC URINE PREG, ED    Imaging Review No results found. I have personally reviewed and evaluated these images and lab results as part of my medical decision-making.   EKG Interpretation None      MDM   Final diagnoses:  Seizure ([place])    34 y.o. female with pertinent PMH of seizures, chronic abd pain presents with recurrent seizure after not taking keppra.  On my exam pt initially appeared drowsy, however after  repeated prompting she awoke and had clear, concise answers and did not appear confused or postictal.  Abd generally tender, more prominent in LLQ, which on history is chronic.  Wu unremarkable.  It appears that the likely etiology is that the pt has not been able to have keppra filled.  Keppra load here and encouraged close fu for financial assistance with meds, which pt states she is actively pursuing.  I have reviewed all laboratory and imaging studies if ordered as above  1. Seizure [facility])         Debby Freiberg, MD 08/12/15 [phone]

## 2015-08-15 ENCOUNTER — Other Ambulatory Visit: Payer: Self-pay | Admitting: Advanced Practice Midwife

## 2015-08-15 DIAGNOSIS — R102 Pelvic and perineal pain: Principal | ICD-10-CM

## 2015-08-15 DIAGNOSIS — G8929 Other chronic pain: Secondary | ICD-10-CM

## 2015-08-16 ENCOUNTER — Ambulatory Visit (HOSPITAL_COMMUNITY)
Admission: RE | Admit: 2015-08-16 | Discharge: 2015-08-16 | Disposition: A | Discharge status: Home / Self Care | Payer: BLUE CROSS/BLUE SHIELD | Source: Ambulatory Visit | Attending: Advanced Practice Midwife | Admitting: Advanced Practice Midwife

## 2015-08-16 DIAGNOSIS — R102 Pelvic and perineal pain: Secondary | ICD-10-CM | POA: Diagnosis present

## 2015-08-16 DIAGNOSIS — D251 Intramural leiomyoma of uterus: Secondary | ICD-10-CM | POA: Insufficient documentation

## 2015-08-16 DIAGNOSIS — G8929 Other chronic pain: Secondary | ICD-10-CM | POA: Diagnosis not present

## 2015-08-21 ENCOUNTER — Telehealth (HOSPITAL_COMMUNITY): Payer: Self-pay | Admitting: *Deleted

## 2015-08-21 NOTE — Telephone Encounter (Signed)
US Transvaginal Non-ob  08/16/2015  CLINICAL DATA:  Patient with chronic pelvic pain for 1 year. EXAM: TRANSABDOMINAL AND TRANSVAGINAL ULTRASOUND OF PELVIS TECHNIQUE: Both transabdominal and transvaginal ultrasound examinations of the pelvis were performed. Transabdominal technique was performed for global imaging of the pelvis including uterus, ovaries, adnexal regions, and pelvic cul-de-sac. It was necessary to proceed with endovaginal exam following the transabdominal exam to visualize the adnexal structures. COMPARISON:  CT abdomen pelvis 01/15/2006. FINDINGS: Uterus Measurements: 9.0 x 3.1 x 3.8 cm. Within the right aspect of the uterine body there is a 1.6 x 1.6 x 1.5 cm intramural fibroid. Endometrium Thickness: 8 mm.  No focal abnormality visualized. Right ovary Measurements: 2.7 x 2.0 x 2.7 cm. Normal appearance/no adnexal mass. Left ovary Measurements: 2.6 x 2.1 x 2.2 cm. Normal appearance/no adnexal mass. Other findings No free fluid. IMPRESSION: Fibroid uterus. Normal ovaries. Electronically Signed   By: Lovey Newcomer M.D.   On: 08/16/2015 09:43   US Pelvis Complete  08/16/2015  CLINICAL DATA:  Patient with chronic pelvic pain for 1 year. EXAM: TRANSABDOMINAL AND TRANSVAGINAL ULTRASOUND OF PELVIS TECHNIQUE: Both transabdominal and transvaginal ultrasound examinations of the pelvis were performed. Transabdominal technique was performed for global imaging of the pelvis including uterus, ovaries, adnexal regions, and pelvic cul-de-sac. It was necessary to proceed with endovaginal exam following the transabdominal exam to visualize the adnexal structures. COMPARISON:  CT abdomen pelvis 01/15/2006. FINDINGS: Uterus Measurements: 9.0 x 3.1 x 3.8 cm. Within the right aspect of the uterine body there is a 1.6 x 1.6 x 1.5 cm intramural fibroid. Endometrium Thickness: 8 mm.  No focal abnormality visualized. Right ovary Measurements: 2.7 x 2.0 x 2.7 cm. Normal appearance/no adnexal mass. Left ovary Measurements:  2.6 x 2.1 x 2.2 cm. Normal appearance/no adnexal mass. Other findings No free fluid. IMPRESSION: Fibroid uterus. Normal ovaries. Electronically Signed   By: Lovey Newcomer M.D.   On: 08/16/2015 09:43   Discussed [place] results with pt. Pt verbalizes understanding

## 2015-08-28 ENCOUNTER — Encounter (HOSPITAL_COMMUNITY): Payer: Self-pay | Admitting: Emergency Medicine

## 2015-08-28 ENCOUNTER — Emergency Department (HOSPITAL_COMMUNITY)
Admission: EM | Admit: 2015-08-28 | Discharge: 2015-08-29 | Disposition: A | Discharge status: Home / Self Care | Payer: BLUE CROSS/BLUE SHIELD | Attending: Emergency Medicine | Admitting: Emergency Medicine

## 2015-08-28 DIAGNOSIS — F17210 Nicotine dependence, cigarettes, uncomplicated: Secondary | ICD-10-CM | POA: Diagnosis not present

## 2015-08-28 DIAGNOSIS — Z880 Allergy status to penicillin: Secondary | ICD-10-CM | POA: Diagnosis not present

## 2015-08-28 DIAGNOSIS — G40909 Epilepsy, unspecified, not intractable, without status epilepticus: Secondary | ICD-10-CM | POA: Insufficient documentation

## 2015-08-28 DIAGNOSIS — Z79899 Other long term (current) drug therapy: Secondary | ICD-10-CM | POA: Insufficient documentation

## 2015-08-28 DIAGNOSIS — Z3202 Encounter for pregnancy test, result negative: Secondary | ICD-10-CM | POA: Insufficient documentation

## 2015-08-28 DIAGNOSIS — R569 Unspecified convulsions: Secondary | ICD-10-CM

## 2015-08-28 DIAGNOSIS — Z8673 Personal history of transient ischemic attack (TIA), and cerebral infarction without residual deficits: Secondary | ICD-10-CM | POA: Diagnosis not present

## 2015-08-28 DIAGNOSIS — J45909 Unspecified asthma, uncomplicated: Secondary | ICD-10-CM | POA: Insufficient documentation

## 2015-08-28 NOTE — ED Provider Notes (Signed)
CSN: [id number]     Arrival date & time 08/28/15  2352 History   By signing my name below, I, Tina [name], attest that this documentation has been prepared under the direction and in the presence of Tina Impastato, MD.  Electronically Signed: [name], ED Scribe. 08/29/2015. 12:18 AM.   Chief Complaint  Patient presents with  . Seizures   Patient is a 34 y.o. female presenting with seizures. The history is provided by the patient.  Seizures Seizure activity on arrival: no   Seizure type:  Unable to specify Preceding symptoms: no sensation of an aura present   Initial focality:  None Return to baseline: yes   Severity:  Moderate Timing:  Once Number of seizures this episode:  1 Progression:  Improving Context: emotional upset   Context: not alcohol withdrawal   Recent head injury:  No recent head injuries PTA treatment:  None History of seizures: yes   Similar to previous episodes: yes   Severity:  Mild Current therapy:  Levetiracetam Compliance with current therapy:  Good  HPI Comments: Tina [name] brought in by EMS is a 34 y.o. female with a PMHx of seizures, stroke, and epilepsy who presents to the Emergency Department here after an unwitnessed fall. Per EMS, pt states she got into an argument with her boyfriend. Pt then walked off from her house and her boyfriend found her on the ground approximately 1 block away. She admits to ETOH use this evening.  PCP: No PCP Per Patient     Past Medical History  Diagnosis Date  . Epilepsy ([place])   . Asthma   . Stroke ([place])   . Seizures ([place])     last seizure 07-2015   Past Surgical History  Procedure Laterality Date  . Umbilical hernia repair  12/2014   Family History  Problem Relation Age of Onset  . Heart failure Mother   . Cancer Father   . Seizures Father    Social History  Substance Use Topics  . Smoking status: Current Every Day Smoker -- 0.50 packs/day    Types: Cigarettes  . Smokeless tobacco:  None  . Alcohol Use: 0.6 oz/week    1 Cans of beer per week     Comment: occ   OB History    Gravida Para Term Preterm AB TAB SAB Ectopic Multiple Living   0 0 0 0 0 0 0 0       Review of Systems  Neurological: Positive for seizures.  All other systems reviewed and are negative.     Allergies  Bee venom; Chocolate; Mosquito (diagnostic); Oregano; Bee venom; Garlic; Garlic; Orange (diagnostic); Orange fruit; Oregano; Penicillins; Penicillins; Tape; and Tape  Home Medications   Prior to Admission medications   Medication Sig Start Date End Date Taking? Authorizing Provider  albuterol (PROVENTIL HFA;VENTOLIN HFA) 108 (90 BASE) MCG/ACT inhaler Inhale 2 puffs into the lungs every 4 (four) hours as needed for wheezing or shortness of breath.    Historical Provider, MD  albuterol (PROVENTIL HFA;VENTOLIN HFA) 108 (90 BASE) MCG/ACT inhaler Inhale 2 puffs into the lungs every 4 (four) hours as needed for wheezing or shortness of breath. 08/03/15   Benjamine Mola, FNP  ARIPiprazole (ABILIFY) 5 MG tablet Take 1 tablet (5 mg total) by mouth daily. 08/03/15   Benjamine Mola, FNP  cetirizine (ZYRTEC) 10 MG tablet Take 10 mg by mouth daily as needed for allergies.     Historical Provider, MD  Fluticasone Furoate-Vilanterol (BREO  ELLIPTA) 200-25 MCG/INH AEPB Inhale 1 puff into the lungs daily after breakfast.    Historical Provider, MD  hydrOXYzine (ATARAX/VISTARIL) 25 MG tablet Take 1 tablet (25 mg total) by mouth every 6 (six) hours as needed for anxiety. 08/03/15   Benjamine Mola, FNP  ibuprofen (ADVIL,MOTRIN) 800 MG tablet Take 1 tablet (800 mg total) by mouth every 8 (eight) hours as needed. Patient not taking: Reported on 08/12/2015 08/07/15   Tresea Mall, CNM  levETIRAcetam (KEPPRA) 500 MG tablet Take 1 tablet (500 mg total) by mouth 2 (two) times daily. 08/03/15   Benjamine Mola, FNP  omeprazole (PRILOSEC) 20 MG capsule Take 1 capsule (20 mg total) by mouth daily. 08/03/15   Benjamine Mola, FNP  topiramate (TOPAMAX) 50 MG tablet Take 1 tablet (50 mg total) by mouth 2 (two) times daily. 08/03/15   Benjamine Mola, FNP  traZODone (DESYREL) 50 MG tablet Take 50 mg by mouth at bedtime.    Historical Provider, MD   Triage Vitals: BP 120/78 mmHg  Pulse 85  Temp(Src) 99.1 F (37.3 C) (Oral)  Resp 18  SpO2 100%  LMP 08/06/2015   Physical Exam  Constitutional: She is oriented to person, place, and time. She appears well-developed and well-nourished. No distress.  HENT:  Head: Normocephalic and atraumatic.  Right Ear: No hemotympanum.  Left Ear: No hemotympanum.  Mouth/Throat: Oropharynx is clear and moist. No oropharyngeal exudate.  Eyes: EOM are normal. Pupils are equal, round, and reactive to light.  No raccoon eyes or battle signs   Neck: Normal range of motion. No thyromegaly present.  Trachea is midline   Cardiovascular: Normal rate, regular rhythm and normal heart sounds.   Pulmonary/Chest: Effort normal and breath sounds normal. No respiratory distress. She has no wheezes. She has no rales.  Abdominal: Soft. Bowel sounds are normal. She exhibits no distension and no mass. There is no tenderness. There is no rebound and no guarding.  Musculoskeletal: Normal range of motion.       Cervical back: Normal.       Thoracic back: Normal.       Lumbar back: Normal.  No crepitus or stepoff of C spine L5 and S1 intact   Neurological: She is alert and oriented to person, place, and time.  Skin: Skin is warm and dry.  Psychiatric: She has a normal mood and affect. Judgment normal.  Nursing note and vitals reviewed.   ED Course  Procedures (including critical care time)  DIAGNOSTIC STUDIES: Oxygen Saturation is 100% on RA, Normal by my interpretation.    COORDINATION OF CARE: 12:15 AM-Discussed treatment plan with pt at bedside and pt agreed to plan.     Labs Review Labs Reviewed - No data to display  Imaging Review No results found. I have personally  reviewed and evaluated these images and lab results as part of my medical decision-making.   EKG Interpretation None      MDM   Final diagnoses:  None    Results for orders placed or performed during the hospital encounter of 08/28/15  CBC with Differential/Platelet  Result Value Ref Range   WBC 6.5 4.0 - 10.5 K/uL   RBC 3.63 (L) 3.87 - 5.11 MIL/uL   Hemoglobin 11.6 (L) 12.0 - 15.0 g/dL   HCT 34.8 (L) 36.0 - 46.0 %   MCV 95.9 78.0 - 100.0 fL   MCH 32.0 26.0 - 34.0 pg   MCHC 33.3 30.0 - 36.0 g/dL  RDW 13.3 11.5 - 15.5 %   Platelets 261 150 - 400 K/uL   Neutrophils Relative % 56 %   Neutro Abs 3.7 1.7 - 7.7 K/uL   Lymphocytes Relative 30 %   Lymphs Abs 1.9 0.7 - 4.0 K/uL   Monocytes Relative 10 %   Monocytes Absolute 0.7 0.1 - 1.0 K/uL   Eosinophils Relative 3 %   Eosinophils Absolute 0.2 0.0 - 0.7 K/uL   Basophils Relative 1 %   Basophils Absolute 0.0 0.0 - 0.1 K/uL  Urine rapid drug screen (hosp performed)  Result Value Ref Range   Opiates NONE DETECTED NONE DETECTED   Cocaine NONE DETECTED NONE DETECTED   Benzodiazepines NONE DETECTED NONE DETECTED   Amphetamines NONE DETECTED NONE DETECTED   Tetrahydrocannabinol NONE DETECTED NONE DETECTED   Barbiturates NONE DETECTED NONE DETECTED  I-Stat Chem 8, ED  Result Value Ref Range   Sodium 142 135 - 145 mmol/L   Potassium 3.8 3.5 - 5.1 mmol/L   Chloride 108 101 - 111 mmol/L   BUN 8 6 - 20 mg/dL   Creatinine, Ser 0.70 0.44 - 1.00 mg/dL   Glucose, Bld 96 65 - 99 mg/dL   Calcium, Ion 1.14 1.12 - 1.23 mmol/L   TCO2 22 0 - 100 mmol/L   Hemoglobin 12.2 12.0 - 15.0 g/dL   HCT 36.0 36.0 - 46.0 %  I-Stat Beta hCG blood, ED ([facility], [facility], [facility] only)  Result Value Ref Range   I-stat hCG, quantitative <5.0 <5 mIU/mL   Comment 3           Ct Head Wo Contrast  08/29/2015  CLINICAL DATA:  Possible seizure. Syncopal episode after alcohol intake, found 1 block from house. EXAM: CT HEAD WITHOUT CONTRAST CT CERVICAL SPINE WITHOUT  CONTRAST TECHNIQUE: Multidetector CT imaging of the head and cervical spine was performed following the standard protocol without intravenous contrast. Multiplanar CT image reconstructions of the cervical spine were also generated. COMPARISON:  CT head March 30, 2004 FINDINGS: CT HEAD FINDINGS The ventricles and sulci are normal. No intraparenchymal hemorrhage, mass effect nor midline shift. No acute large vascular territory infarcts. No abnormal extra-axial fluid collections. Basal cisterns are patent. No skull fracture. The included ocular globes and orbital contents are non-suspicious. The mastoid aircells and included paranasal sinuses are well-aerated. CT CERVICAL SPINE FINDINGS Cervical vertebral bodies and posterior elements are intact and aligned with straightened cervical lordosis. Intervertebral disc heights preserved. No destructive bony lesions. C1-2 articulation maintained. Included prevertebral and paraspinal soft tissues are unremarkable. IMPRESSION: CT HEAD: No acute intracranial process ; normal noncontrast CT head. CT CERVICAL SPINE: Straightened cervical lordosis without acute fracture or malalignment. Electronically Signed   By: Elon Alas M.D.   On: 08/29/2015 01:04   Ct Cervical Spine Wo Contrast  08/29/2015  CLINICAL DATA:  Possible seizure. Syncopal episode after alcohol intake, found 1 block from house. EXAM: CT HEAD WITHOUT CONTRAST CT CERVICAL SPINE WITHOUT CONTRAST TECHNIQUE: Multidetector CT imaging of the head and cervical spine was performed following the standard protocol without intravenous contrast. Multiplanar CT image reconstructions of the cervical spine were also generated. COMPARISON:  CT head March 30, 2004 FINDINGS: CT HEAD FINDINGS The ventricles and sulci are normal. No intraparenchymal hemorrhage, mass effect nor midline shift. No acute large vascular territory infarcts. No abnormal extra-axial fluid collections. Basal cisterns are patent. No skull fracture. The  included ocular globes and orbital contents are non-suspicious. The mastoid aircells and included paranasal sinuses are well-aerated. CT  CERVICAL SPINE FINDINGS Cervical vertebral bodies and posterior elements are intact and aligned with straightened cervical lordosis. Intervertebral disc heights preserved. No destructive bony lesions. C1-2 articulation maintained. Included prevertebral and paraspinal soft tissues are unremarkable. IMPRESSION: CT HEAD: No acute intracranial process ; normal noncontrast CT head. CT CERVICAL SPINE: Straightened cervical lordosis without acute fracture or malalignment. Electronically Signed   By: Elon Alas M.D.   On: 08/29/2015 01:04   US Transvaginal Non-ob  08/16/2015  CLINICAL DATA:  Patient with chronic pelvic pain for 1 year. EXAM: TRANSABDOMINAL AND TRANSVAGINAL ULTRASOUND OF PELVIS TECHNIQUE: Both transabdominal and transvaginal ultrasound examinations of the pelvis were performed. Transabdominal technique was performed for global imaging of the pelvis including uterus, ovaries, adnexal regions, and pelvic cul-de-sac. It was necessary to proceed with endovaginal exam following the transabdominal exam to visualize the adnexal structures. COMPARISON:  CT abdomen pelvis 01/15/2006. FINDINGS: Uterus Measurements: 9.0 x 3.1 x 3.8 cm. Within the right aspect of the uterine body there is a 1.6 x 1.6 x 1.5 cm intramural fibroid. Endometrium Thickness: 8 mm.  No focal abnormality visualized. Right ovary Measurements: 2.7 x 2.0 x 2.7 cm. Normal appearance/no adnexal mass. Left ovary Measurements: 2.6 x 2.1 x 2.2 cm. Normal appearance/no adnexal mass. Other findings No free fluid. IMPRESSION: Fibroid uterus. Normal ovaries. Electronically Signed   By: Lovey Newcomer M.D.   On: 08/16/2015 09:43   US Pelvis Complete  08/16/2015  CLINICAL DATA:  Patient with chronic pelvic pain for 1 year. EXAM: TRANSABDOMINAL AND TRANSVAGINAL ULTRASOUND OF PELVIS TECHNIQUE: Both transabdominal and  transvaginal ultrasound examinations of the pelvis were performed. Transabdominal technique was performed for global imaging of the pelvis including uterus, ovaries, adnexal regions, and pelvic cul-de-sac. It was necessary to proceed with endovaginal exam following the transabdominal exam to visualize the adnexal structures. COMPARISON:  CT abdomen pelvis 01/15/2006. FINDINGS: Uterus Measurements: 9.0 x 3.1 x 3.8 cm. Within the right aspect of the uterine body there is a 1.6 x 1.6 x 1.5 cm intramural fibroid. Endometrium Thickness: 8 mm.  No focal abnormality visualized. Right ovary Measurements: 2.7 x 2.0 x 2.7 cm. Normal appearance/no adnexal mass. Left ovary Measurements: 2.6 x 2.1 x 2.2 cm. Normal appearance/no adnexal mass. Other findings No free fluid. IMPRESSION: Fibroid uterus. Normal ovaries. Electronically Signed   By: Lovey Newcomer M.D.   On: 08/16/2015 09:43   No seizure like activity loaded with keppra, follow up with your neurologist.  No driving until cleared by neurology  I personally performed the services described in this documentation, which was scribed in my presence. The recorded information has been reviewed and is accurate.     Veatrice Kells, MD 08/29/15 [phone]

## 2015-08-29 ENCOUNTER — Emergency Department (HOSPITAL_COMMUNITY): Payer: BLUE CROSS/BLUE SHIELD

## 2015-08-29 ENCOUNTER — Encounter (HOSPITAL_COMMUNITY): Payer: Self-pay | Admitting: *Deleted

## 2015-08-29 LAB — CBC WITH DIFFERENTIAL/PLATELET
Basophils Absolute: 0 10*3/uL (ref 0.0–0.1)
Basophils Relative: 1 %
EOS ABS: 0.2 10*3/uL (ref 0.0–0.7)
Eosinophils Relative: 3 %
HCT: 34.8 % — ABNORMAL LOW (ref 36.0–46.0)
HEMOGLOBIN: 11.6 g/dL — AB (ref 12.0–15.0)
LYMPHS ABS: 1.9 10*3/uL (ref 0.7–4.0)
LYMPHS PCT: 30 %
MCH: 32 pg (ref 26.0–34.0)
MCHC: 33.3 g/dL (ref 30.0–36.0)
MCV: 95.9 fL (ref 78.0–100.0)
Monocytes Absolute: 0.7 10*3/uL (ref 0.1–1.0)
Monocytes Relative: 10 %
NEUTROS PCT: 56 %
Neutro Abs: 3.7 10*3/uL (ref 1.7–7.7)
Platelets: 261 10*3/uL (ref 150–400)
RBC: 3.63 MIL/uL — AB (ref 3.87–5.11)
RDW: 13.3 % (ref 11.5–15.5)
WBC: 6.5 10*3/uL (ref 4.0–10.5)

## 2015-08-29 LAB — I-STAT CHEM 8, ED
BUN: 8 mg/dL (ref 6–20)
CALCIUM ION: 1.14 mmol/L (ref 1.12–1.23)
CHLORIDE: 108 mmol/L (ref 101–111)
Creatinine, Ser: 0.7 mg/dL (ref 0.44–1.00)
Glucose, Bld: 96 mg/dL (ref 65–99)
HCT: 36 % (ref 36.0–46.0)
Hemoglobin: 12.2 g/dL (ref 12.0–15.0)
Potassium: 3.8 mmol/L (ref 3.5–5.1)
SODIUM: 142 mmol/L (ref 135–145)
TCO2: 22 mmol/L (ref 0–100)

## 2015-08-29 LAB — RAPID URINE DRUG SCREEN, HOSP PERFORMED
Amphetamines: NOT DETECTED
BARBITURATES: NOT DETECTED
BENZODIAZEPINES: NOT DETECTED
COCAINE: NOT DETECTED
Opiates: NOT DETECTED
TETRAHYDROCANNABINOL: NOT DETECTED

## 2015-08-29 LAB — I-STAT BETA HCG BLOOD, ED (MC, WL, AP ONLY): I-stat hCG, quantitative: <5 m[IU]/mL (ref <5)

## 2015-08-29 MED ORDER — SODIUM CHLORIDE 0.9 % IV SOLN
1000.0000 mg | Freq: Once | INTRAVENOUS | Status: AC
  Administered 2015-08-29: 1000 mg via INTRAVENOUS
  Filled 2015-08-29: qty 10

## 2015-08-29 NOTE — ED Notes (Signed)
Pt arrives to the ER via EMS for complaints of possible seizure; pt reports ETOH this evening and an argument with fiance; pt walked out from house and was found by fiance on the ground approx 1 block from house; pt refused [facility] via EMS for C-Collar placed; pt c/o headache states "I think I hit my head"; EMS reports pseudoseizure activity en route; pt with no obvious injuries; pt awake and responsive but keeps eyes closed

## 2015-08-29 NOTE — ED Notes (Signed)
Pt ambulated to the BR without difficulty

## 2015-08-29 NOTE — ED Notes (Signed)
C-Collar removed per Dr Randal Buba

## 2015-08-29 NOTE — Discharge Instructions (Signed)

## 2015-09-10 ENCOUNTER — Emergency Department (HOSPITAL_COMMUNITY): Payer: BLUE CROSS/BLUE SHIELD

## 2015-09-10 ENCOUNTER — Emergency Department (HOSPITAL_COMMUNITY)
Admission: EM | Admit: 2015-09-10 | Discharge: 2015-09-10 | Disposition: A | Discharge status: Other Acute Inpatient Hospital | Payer: BLUE CROSS/BLUE SHIELD | Attending: Emergency Medicine | Admitting: Emergency Medicine

## 2015-09-10 ENCOUNTER — Inpatient Hospital Stay (HOSPITAL_COMMUNITY)
Admission: AD | Admit: 2015-09-10 | Discharge: 2015-09-12 | DRG: 885 | Disposition: A | Discharge status: Home / Self Care | Payer: BLUE CROSS/BLUE SHIELD | Source: Intra-hospital | Attending: Psychiatry | Admitting: Psychiatry

## 2015-09-10 ENCOUNTER — Encounter (HOSPITAL_COMMUNITY): Payer: Self-pay | Admitting: Emergency Medicine

## 2015-09-10 ENCOUNTER — Encounter (HOSPITAL_COMMUNITY): Payer: Self-pay | Admitting: Registered Nurse

## 2015-09-10 DIAGNOSIS — Z801 Family history of malignant neoplasm of trachea, bronchus and lung: Secondary | ICD-10-CM

## 2015-09-10 DIAGNOSIS — Z79899 Other long term (current) drug therapy: Secondary | ICD-10-CM | POA: Diagnosis not present

## 2015-09-10 DIAGNOSIS — R569 Unspecified convulsions: Secondary | ICD-10-CM | POA: Insufficient documentation

## 2015-09-10 DIAGNOSIS — Z8249 Family history of ischemic heart disease and other diseases of the circulatory system: Secondary | ICD-10-CM

## 2015-09-10 DIAGNOSIS — F332 Major depressive disorder, recurrent severe without psychotic features: Principal | ICD-10-CM | POA: Diagnosis present

## 2015-09-10 DIAGNOSIS — Z880 Allergy status to penicillin: Secondary | ICD-10-CM | POA: Diagnosis not present

## 2015-09-10 DIAGNOSIS — Z8673 Personal history of transient ischemic attack (TIA), and cerebral infarction without residual deficits: Secondary | ICD-10-CM

## 2015-09-10 DIAGNOSIS — J45909 Unspecified asthma, uncomplicated: Secondary | ICD-10-CM | POA: Diagnosis not present

## 2015-09-10 DIAGNOSIS — F1090 Alcohol use, unspecified, uncomplicated: Secondary | ICD-10-CM

## 2015-09-10 DIAGNOSIS — K219 Gastro-esophageal reflux disease without esophagitis: Secondary | ICD-10-CM | POA: Diagnosis present

## 2015-09-10 DIAGNOSIS — F411 Generalized anxiety disorder: Secondary | ICD-10-CM | POA: Diagnosis present

## 2015-09-10 DIAGNOSIS — F1210 Cannabis abuse, uncomplicated: Secondary | ICD-10-CM

## 2015-09-10 DIAGNOSIS — F17210 Nicotine dependence, cigarettes, uncomplicated: Secondary | ICD-10-CM | POA: Diagnosis not present

## 2015-09-10 DIAGNOSIS — F339 Major depressive disorder, recurrent, unspecified: Secondary | ICD-10-CM | POA: Diagnosis present

## 2015-09-10 DIAGNOSIS — G40909 Epilepsy, unspecified, not intractable, without status epilepticus: Secondary | ICD-10-CM | POA: Diagnosis present

## 2015-09-10 DIAGNOSIS — F1099 Alcohol use, unspecified with unspecified alcohol-induced disorder: Secondary | ICD-10-CM | POA: Diagnosis not present

## 2015-09-10 DIAGNOSIS — R45851 Suicidal ideations: Secondary | ICD-10-CM

## 2015-09-10 DIAGNOSIS — G4700 Insomnia, unspecified: Secondary | ICD-10-CM | POA: Diagnosis present

## 2015-09-10 DIAGNOSIS — IMO0002 Reserved for concepts with insufficient information to code with codable children: Secondary | ICD-10-CM

## 2015-09-10 DIAGNOSIS — Z3202 Encounter for pregnancy test, result negative: Secondary | ICD-10-CM | POA: Insufficient documentation

## 2015-09-10 LAB — I-STAT BETA HCG BLOOD, ED (MC, WL, AP ONLY)

## 2015-09-10 LAB — CBC WITH DIFFERENTIAL/PLATELET
BASOS PCT: 0 %
Basophils Absolute: 0 10*3/uL (ref 0.0–0.1)
EOS ABS: 0 10*3/uL (ref 0.0–0.7)
Eosinophils Relative: 0 %
HEMATOCRIT: 34 % — AB (ref 36.0–46.0)
Hemoglobin: 11.7 g/dL — ABNORMAL LOW (ref 12.0–15.0)
LYMPHS ABS: 0.9 10*3/uL (ref 0.7–4.0)
Lymphocytes Relative: 18 %
MCH: 32.3 pg (ref 26.0–34.0)
MCHC: 34.4 g/dL (ref 30.0–36.0)
MCV: 93.9 fL (ref 78.0–100.0)
MONO ABS: 0.5 10*3/uL (ref 0.1–1.0)
MONOS PCT: 10 %
NEUTROS ABS: 3.4 10*3/uL (ref 1.7–7.7)
Neutrophils Relative %: 72 %
Platelets: 254 10*3/uL (ref 150–400)
RBC: 3.62 MIL/uL — ABNORMAL LOW (ref 3.87–5.11)
RDW: 12.7 % (ref 11.5–15.5)
WBC: 4.8 10*3/uL (ref 4.0–10.5)

## 2015-09-10 LAB — COMPREHENSIVE METABOLIC PANEL
ALBUMIN: 4.3 g/dL (ref 3.5–5.0)
ALK PHOS: 44 U/L (ref 38–126)
ALT: 16 U/L (ref 14–54)
ANION GAP: 11 (ref 5–15)
AST: 24 U/L (ref 15–41)
BILIRUBIN TOTAL: 0.6 mg/dL (ref 0.3–1.2)
BUN: 11 mg/dL (ref 6–20)
CALCIUM: 8.8 mg/dL — AB (ref 8.9–10.3)
CO2: 21 mmol/L — ABNORMAL LOW (ref 22–32)
Chloride: 108 mmol/L (ref 101–111)
Creatinine, Ser: 0.96 mg/dL (ref 0.44–1.00)
GFR calc non Af Amer: >60 mL/min (ref >60)
GLUCOSE: 89 mg/dL (ref 65–99)
POTASSIUM: 3.3 mmol/L — AB (ref 3.5–5.1)
Sodium: 140 mmol/L (ref 135–145)
TOTAL PROTEIN: 7.3 g/dL (ref 6.5–8.1)

## 2015-09-10 LAB — RAPID URINE DRUG SCREEN, HOSP PERFORMED
Amphetamines: NOT DETECTED
Barbiturates: NOT DETECTED
Benzodiazepines: NOT DETECTED
Cocaine: NOT DETECTED
OPIATES: NOT DETECTED
Tetrahydrocannabinol: POSITIVE — AB

## 2015-09-10 LAB — CBG MONITORING, ED: Glucose-Capillary: 82 mg/dL (ref 65–99)

## 2015-09-10 LAB — ETHANOL: ALCOHOL ETHYL (B): 64 mg/dL — AB (ref <5)

## 2015-09-10 MED ORDER — PANTOPRAZOLE SODIUM 40 MG PO TBEC
40.0000 mg | DELAYED_RELEASE_TABLET | Freq: Every day | ORAL | Status: DC
  Administered 2015-09-10 – 2015-09-12 (×3): 40 mg via ORAL
  Filled 2015-09-10 (×5): qty 1

## 2015-09-10 MED ORDER — ACETAMINOPHEN 325 MG PO TABS
650.0000 mg | ORAL_TABLET | Freq: Four times a day (QID) | ORAL | Status: DC | PRN
  Administered 2015-09-10 (×2): 650 mg via ORAL
  Filled 2015-09-10 (×2): qty 2

## 2015-09-10 MED ORDER — FLUTICASONE FUROATE-VILANTEROL 200-25 MCG/INH IN AEPB: 1.0000 | INHALATION_SPRAY | Freq: Every day | RESPIRATORY_TRACT | Status: DC

## 2015-09-10 MED ORDER — LEVETIRACETAM 500 MG PO TABS
500.0000 mg | ORAL_TABLET | Freq: Once | ORAL | Status: DC
  Filled 2015-09-10: qty 1

## 2015-09-10 MED ORDER — SERTRALINE HCL 25 MG PO TABS
25.0000 mg | ORAL_TABLET | Freq: Every day | ORAL | Status: DC
  Administered 2015-09-10 – 2015-09-12 (×3): 25 mg via ORAL
  Filled 2015-09-10 (×5): qty 1

## 2015-09-10 MED ORDER — PANTOPRAZOLE SODIUM 40 MG PO TBEC
40.0000 mg | DELAYED_RELEASE_TABLET | Freq: Every day | ORAL | Status: DC
  Administered 2015-09-10: 40 mg via ORAL
  Filled 2015-09-10: qty 1

## 2015-09-10 MED ORDER — ARIPIPRAZOLE 10 MG PO TABS
10.0000 mg | ORAL_TABLET | Freq: Every day | ORAL | Status: DC
  Administered 2015-09-11 – 2015-09-12 (×2): 10 mg via ORAL
  Filled 2015-09-10 (×3): qty 1

## 2015-09-10 MED ORDER — LORATADINE 10 MG PO TABS
10.0000 mg | ORAL_TABLET | Freq: Every day | ORAL | Status: DC
  Administered 2015-09-10 – 2015-09-12 (×3): 10 mg via ORAL
  Filled 2015-09-10 (×4): qty 1

## 2015-09-10 MED ORDER — HYDROXYZINE HCL 25 MG PO TABS
25.0000 mg | ORAL_TABLET | Freq: Three times a day (TID) | ORAL | Status: DC | PRN
  Administered 2015-09-10 – 2015-09-11 (×2): 25 mg via ORAL
  Filled 2015-09-10 (×2): qty 1

## 2015-09-10 MED ORDER — OXCARBAZEPINE 150 MG PO TABS
150.0000 mg | ORAL_TABLET | Freq: Two times a day (BID) | ORAL | Status: DC
  Administered 2015-09-10 – 2015-09-12 (×4): 150 mg via ORAL
  Filled 2015-09-10 (×7): qty 1

## 2015-09-10 MED ORDER — LEVETIRACETAM 500 MG PO TABS
500.0000 mg | ORAL_TABLET | Freq: Once | ORAL | Status: AC
  Administered 2015-09-10: 500 mg via ORAL
  Filled 2015-09-10: qty 1

## 2015-09-10 MED ORDER — HYDROXYZINE HCL 25 MG PO TABS: 25.0000 mg | ORAL_TABLET | Freq: Four times a day (QID) | ORAL | Status: DC | PRN

## 2015-09-10 MED ORDER — ARIPIPRAZOLE 5 MG PO TABS
5.0000 mg | ORAL_TABLET | Freq: Every day | ORAL | Status: DC
  Administered 2015-09-10: 5 mg via ORAL
  Filled 2015-09-10 (×2): qty 1

## 2015-09-10 MED ORDER — ACETAMINOPHEN 500 MG PO TABS
1000.0000 mg | ORAL_TABLET | Freq: Once | ORAL | Status: AC
  Administered 2015-09-10: 1000 mg via ORAL
  Filled 2015-09-10: qty 2

## 2015-09-10 MED ORDER — LORATADINE 10 MG PO TABS
10.0000 mg | ORAL_TABLET | Freq: Every day | ORAL | Status: DC
  Administered 2015-09-10: 10 mg via ORAL
  Filled 2015-09-10: qty 1

## 2015-09-10 MED ORDER — HYDROXYZINE HCL 25 MG PO TABS: 25.0000 mg | ORAL_TABLET | Freq: Three times a day (TID) | ORAL | Status: DC | PRN

## 2015-09-10 MED ORDER — ALUM & MAG HYDROXIDE-SIMETH 200-200-20 MG/5ML PO SUSP
30.0000 mL | ORAL | Status: DC | PRN
Start: 2015-09-10 — End: 2015-09-12
  Administered 2015-09-11: 30 mL via ORAL
  Filled 2015-09-10: qty 30

## 2015-09-10 MED ORDER — OXCARBAZEPINE 150 MG PO TABS
150.0000 mg | ORAL_TABLET | Freq: Two times a day (BID) | ORAL | Status: DC
  Administered 2015-09-10: 150 mg via ORAL
  Filled 2015-09-10 (×3): qty 1

## 2015-09-10 MED ORDER — POTASSIUM CHLORIDE CRYS ER 20 MEQ PO TBCR
40.0000 meq | EXTENDED_RELEASE_TABLET | Freq: Once | ORAL | Status: AC
  Administered 2015-09-10: 40 meq via ORAL
  Filled 2015-09-10: qty 2

## 2015-09-10 MED ORDER — ARIPIPRAZOLE 5 MG PO TABS
5.0000 mg | ORAL_TABLET | Freq: Every day | ORAL | Status: DC
  Administered 2015-09-10: 5 mg via ORAL
  Filled 2015-09-10 (×3): qty 1

## 2015-09-10 MED ORDER — ALBUTEROL SULFATE HFA 108 (90 BASE) MCG/ACT IN AERS: 2.0000 | INHALATION_SPRAY | RESPIRATORY_TRACT | Status: DC | PRN

## 2015-09-10 MED ORDER — ALBUTEROL SULFATE HFA 108 (90 BASE) MCG/ACT IN AERS
2.0000 | INHALATION_SPRAY | RESPIRATORY_TRACT | Status: DC | PRN
  Administered 2015-09-11 – 2015-09-12 (×2): 2 via RESPIRATORY_TRACT

## 2015-09-10 MED ORDER — LEVETIRACETAM 500 MG PO TABS
500.0000 mg | ORAL_TABLET | Freq: Two times a day (BID) | ORAL | Status: DC
  Administered 2015-09-10: 500 mg via ORAL
  Filled 2015-09-10 (×3): qty 1

## 2015-09-10 MED ORDER — LEVETIRACETAM 500 MG PO TABS
500.0000 mg | ORAL_TABLET | Freq: Two times a day (BID) | ORAL | Status: DC
  Administered 2015-09-10 – 2015-09-12 (×4): 500 mg via ORAL
  Filled 2015-09-10 (×6): qty 1

## 2015-09-10 MED ORDER — MAGNESIUM HYDROXIDE 400 MG/5ML PO SUSP: 30.0000 mL | Freq: Every day | ORAL | Status: DC | PRN

## 2015-09-10 MED ORDER — SERTRALINE HCL 50 MG PO TABS
25.0000 mg | ORAL_TABLET | Freq: Every day | ORAL | Status: DC
  Administered 2015-09-10: 25 mg via ORAL
  Filled 2015-09-10: qty 1

## 2015-09-10 NOTE — ED Provider Notes (Signed)
By signing my name below, I, [name], attest that this documentation has been prepared under the direction and in the presence of [name], DO. Electronically Signed: [name], ED Scribe. 09/10/2015. 3:42 AM.  TIME SEEN: 2:55 AM   CHIEF COMPLAINT: SI; Seizures  HPI:  Tina [name] is a 34 y.o. female who presents to the Emergency Department IVC'ed by police for SI today. Per IVC notes, police arrived at her door and she asked them to shoot her, repeating "just shoot me." Patient reports alcohol consumption today but denies drug use. She states she has SI but denies having a specific plan. She reports a prior history of suicide attempt, with laceration marks noted on her arm. Per nursing notes, patient had 2 episodes of seizure-like activity after [employer] put her into custody. She reports compliance with her medications (on Keppra and Trileptal).  She denies HI or hallucinations.   ROS: See HPI Constitutional: no fever  Eyes: no drainage  ENT: no runny nose   Cardiovascular:  no chest pain  Resp: no SOB  GI: no vomiting GU: no dysuria Integumentary: no rash  Allergy: no hives  Musculoskeletal: no leg swelling  Neurological: no slurred speech ROS otherwise negative  PAST MEDICAL HISTORY/PAST SURGICAL HISTORY:  Past Medical History  Diagnosis Date  . Epilepsy ([place])   . Asthma   . Stroke ([place])   . Seizures ([place])     last seizure 07-2015    MEDICATIONS:  Prior to Admission medications   Medication Sig Start Date End Date Taking? Authorizing Provider  albuterol (PROVENTIL HFA;VENTOLIN HFA) 108 (90 BASE) MCG/ACT inhaler Inhale 2 puffs into the lungs every 4 (four) hours as needed for wheezing or shortness of breath. 08/03/15   Benjamine Mola, FNP  ARIPiprazole (ABILIFY) 5 MG tablet Take 1 tablet (5 mg total) by mouth daily. 08/03/15   Benjamine Mola, FNP  cetirizine (ZYRTEC) 10 MG tablet Take 10 mg by mouth daily as needed for allergies.     Historical Provider, MD   dextromethorphan-guaiFENesin (MUCINEX DM) 30-600 MG 12hr tablet Take 1 tablet by mouth 2 (two) times daily.    Historical Provider, MD  Fluticasone Furoate-Vilanterol (BREO ELLIPTA) 200-25 MCG/INH AEPB Inhale 1 puff into the lungs daily after breakfast.    Historical Provider, MD  hydrOXYzine (ATARAX/VISTARIL) 25 MG tablet Take 1 tablet (25 mg total) by mouth every 6 (six) hours as needed for anxiety. 08/03/15   Benjamine Mola, FNP  ibuprofen (ADVIL,MOTRIN) 800 MG tablet Take 1 tablet (800 mg total) by mouth every 8 (eight) hours as needed. Patient not taking: Reported on 08/12/2015 08/07/15   Tresea Mall, CNM  levETIRAcetam (KEPPRA) 500 MG tablet Take 1 tablet (500 mg total) by mouth 2 (two) times daily. 08/03/15   Benjamine Mola, FNP  omeprazole (PRILOSEC) 20 MG capsule Take 1 capsule (20 mg total) by mouth daily. 08/03/15   Benjamine Mola, FNP  OXcarbazepine (TRILEPTAL) 150 MG tablet Take 150 mg by mouth 2 (two) times daily.    Historical Provider, MD  sertraline (ZOLOFT) 25 MG tablet Take 25 mg by mouth daily.    Historical Provider, MD  topiramate (TOPAMAX) 50 MG tablet Take 1 tablet (50 mg total) by mouth 2 (two) times daily. Patient not taking: Reported on 08/29/2015 08/03/15   Benjamine Mola, FNP    ALLERGIES:  Allergies  Allergen Reactions  . Bee Venom Anaphylaxis and Swelling    Swelling of whole body   .  Chocolate Anaphylaxis  . Mosquito (Diagnostic) Anaphylaxis and Swelling    Swelling of whole body   . Oregano [Origanum Oil] Swelling    Swelling of roof of mouth   . Bee Venom Other (See Comments)    intolerance  . Garlic Itching and Other (See Comments)    Makes eyes burn  . Garlic Other (See Comments)    intolerance  . Orange (Diagnostic) Swelling  . Orange Fruit [Citrus] Other (See Comments)    Makes tongue bleed   . Oregano [Origanum Oil] Other (See Comments)    intolerance  . Penicillins Hives    Has patient had a PCN reaction causing immediate rash,  facial/tongue/throat swelling, SOB or lightheadedness with hypotension: Yes Has patient had a PCN reaction causing severe rash involving mucus membranes or skin necrosis: Yes Has patient had a PCN reaction that required hospitalization Yes Has patient had a PCN reaction occurring within the last 10 years: No If all of the above answers are "NO", then may proceed with Cephalosporin use.   Marland Kitchen Penicillins Other (See Comments)    intolerance  . Tape   . Tape Rash    SOCIAL HISTORY:  Social History  Substance Use Topics  . Smoking status: Current Every Day Smoker -- 0.50 packs/day    Types: Cigarettes  . Smokeless tobacco: Not on file  . Alcohol Use: 0.6 oz/week    1 Cans of beer per week     Comment: occ    FAMILY HISTORY: Family History  Problem Relation Age of Onset  . Heart failure Mother   . Cancer Father   . Seizures Father     EXAM: BP 118/86 mmHg  Pulse 96  Resp 16  SpO2 98% TEmp 97.8 CONSTITUTIONAL: Appears intoxicated. Drowsy but arousable. Alert and oriented and responds appropriately to questions. Well-appearing; well-nourished. HEAD: Normocephalic EYES: Conjunctivae clear, PERRL ENT: normal nose; no rhinorrhea; moist mucous membranes; pharynx without lesions noted NECK: Supple, no meningismus, no LAD  CARD: RRR; S1 and S2 appreciated; no murmurs, no clicks, no rubs, no gallops RESP: Normal chest excursion without splinting or tachypnea; breath sounds clear and equal bilaterally; no wheezes, no rhonchi, no rales, no hypoxia or respiratory distress, speaking full sentences ABD/GI: Normal bowel sounds; non-distended; soft, minimally tender throughout the abdomen, no rebound, no guarding, no peritoneal signs BACK:  The back appears normal and is non-tender to palpation, there is no CVA tenderness EXT: Normal ROM in all joints; non-tender to palpation; no edema; normal capillary refill; no cyanosis, no calf tenderness or swelling    SKIN: Normal color for age and race;  warm NEURO: Moves all extremities equally, sensation to light touch intact diffusely, cranial nerves II through XII intact PSYCH: Endorses SI without plan. No HI or hallucinations.    MEDICAL DECISION MAKING: Patient here with suicidal ideation. She states she is unsure who called the police tonight. One place arrived he stated that she was asking them to shoot her repeatedly. She reported that she did not want to live. When she was being taken into custody she had two seizures that appeared to be psychogenic in nature. No postictal period. She is on antiepileptics as she does have a history of epilepsy. Complains of a headache and chest pain which is typical after her seizures per her report. Does have some mild tenderness on abdominal exam but no guarding or rebound. Denies fevers, vomiting, diarrhea. No vaginal bleeding, discharge, dysuria or hematuria. We'll give Tylenol for her headache. We'll obtain  labs, EKG and chest x-ray. We'll give her a dose of her home Keppra. She denies missing any doses recently. She does endorse SI. IVC paperwork has been started by [facility] police department. I will complete my part of the IVC paperwork as well as in consult TTS.  ED PROGRESS: Patient's labs unremarkable other than slightly low potassium of 3.3. I will give oral potassium. Alcohol is 64 and your direction positive for THC. She has been resting comfortably. EKG shows no ischemic changes and chest x-ray clear. I feel she is medically cleared and is awaiting TTS evaluation and psychiatric disposition. She is under involuntary commitment at this time. Her home  medications have been reordered.     EKG Interpretation  Date/Time:  Monday September 10 2015 04:20:26 EST Ventricular Rate:  98 PR Interval:  134 QRS Duration: 75 QT Interval:  341 QTC Calculation: 435 R Axis:   64 Text Interpretation:  Sinus rhythm Borderline T abnormalities, anterior leads No significant change since last tracing Confirmed  by WARD,  DO, KRISTEN [phone]) on 09/10/2015 6:49:33 AM        I personally performed the services described in this documentation, which was scribed in my presence. The recorded information has been reviewed and is accurate.   [place], DO 09/10/15 [phone]

## 2015-09-10 NOTE — BHH Suicide Risk Assessment (Addendum)
[facility] Admission Suicide Risk Assessment   Nursing information obtained from:   patient and chart  Demographic factors:   34 year old female , no children,  lives with GF, currently unemployed,  Current Mental Status:   see below Loss Factors:   unemployment , relationship issues  Historical Factors:   recent psychiatric admission Risk Reduction Factors:   resilience  Total Time spent with patient: 45 minutes Principal Problem: Major depressive disorder, recurrent ([place]) Diagnosis:   Patient Active Problem List   Diagnosis Date Noted  . Major depressive disorder, recurrent ([place]) [F33.9] 09/10/2015  . Alcohol use disorder ([place]) [F10.99] 09/10/2015  . Cannabis use disorder, mild, abuse [F12.10] 09/10/2015  . Depression, major, severe recurrence ([place]) [F33.2] 09/10/2015  . Suicide attempt ([place]) [T14.91]   . MDD (major depressive disorder), single episode, severe with psychotic features ([place]) [F32.3] 07/31/2015     Continued Clinical Symptoms:    The "Alcohol Use Disorders Identification Test", Guidelines for Use in Primary Care, Second Edition.  World Pharmacologist [facility]). Score between 0-7:  no or low risk or alcohol related problems. Score between 8-15:  moderate risk of alcohol related problems. Score between 16-19:  high risk of alcohol related problems. Score 20 or above:  warrants further diagnostic evaluation for alcohol dependence and treatment.   CLINICAL FACTORS:  34 year old female . Known to our unit from recent psychiatric admission in November for depression and suicidal gesture by overdose. States she had been " doing fine ", until she recently had a confrontation with a sister . States " before I saw her all was good ". She states " I think my sister started trying to rile me up on purpose". States she normally does not drink or use drugs but on that occasion did drink " about three shots " and used cannabis .  States " I thought the weed was going to help but it made  me feel worse ".  Patient states she is gaining insight into " bad things happening when I drink, even though I do not drink often" States she contacted to police because she felt herself " about to snap" and upon their arrival does admit she asked them to shoot her, which she now states " it was the alcohol and the drugs that made me say that, I do not want to die." She was then admitted on commitment . Dx- Suicidal ideations . Alcohol Abuse . Seizure Disorder by history. Plan - inpatient admission. Continue Keppra, Trileptal, started on low dose Zoloft. Encourage , focus on early recovery and relapse prevention efforts .    Musculoskeletal: Strength & Muscle Tone: within normal limits Gait & Station: normal Patient leans: N/A  Psychiatric Specialty Exam: Physical Exam  ROSdenies headache, denies chest pain, no SOB at this time, denies nausea, denies vomiting, denies dysuria or urgency, denies peripheral edema, denies rash, denies fever or chills   Last menstrual period 09/10/2015.There is no weight on file to calculate BMI.  General Appearance: Fairly Groomed  Engineer, water::  Good  Speech:  Normal Rate  Volume:  Normal  Mood:  denies depression at this time , states today feels " all right"   Affect:  Appropriate  Thought Process:  Linear  Orientation:  Full (Time, Place, and Person)  Thought Content:  denies hallucinations, no delusions  Suicidal Thoughts:  denies any current suicidal ideations, contracts for safety on unit   Homicidal Thoughts:  No denies any violent or homicidal ideations towards  anyone   Memory:  reecent and remote grossly intact   Judgement:  Fair  Insight:  Fair  Psychomotor Activity:  Normal  Concentration:  Good  Recall:  Good  Fund of Knowledge:Good  Language: Good  Akathisia:  Negative  Handed:  Right  AIMS (if indicated):     Assets:  Desire for Improvement Resilience Social Support  Sleep:     Cognition: WNL  ADL's:  Intact     COGNITIVE  FEATURES THAT CONTRIBUTE TO RISK:  Closed-mindedness and Loss of executive function    SUICIDE RISK:   Mild:  Suicidal ideation of limited frequency, intensity, duration, and specificity.  There are no identifiable plans, no associated intent, mild dysphoria and related symptoms, good self-control (both objective and subjective assessment), few other risk factors, and identifiable protective factors, including available and accessible social support.  PLAN OF CARE: Patient will be admitted to inpatient psychiatric unit for stabilization and safety. Will provide and encourage milieu participation. Provide medication management and maked adjustments as needed.  Will follow daily.    Medical Decision Making:  Established Problem, Stable/Improving (1), Review of Psycho-Social Stressors (1), Review or order clinical lab tests (1) and Review of New Medication or Change in Dosage (2)  I certify that inpatient services furnished can reasonably be expected to improve the patient's condition.   Dechelle Attaway, [place] 09/10/2015, 4:25 PM

## 2015-09-10 NOTE — ED Notes (Signed)
Pt to xray

## 2015-09-10 NOTE — H&P (Signed)
CBC Chemistry Profile UDS UA Psychiatric Admission Assessment Adult  Patient Identification: Tina [name] MRN:  [id number] Date of Evaluation:  09/10/2015 Chief Complaint:  mdd,rec,sev Principal Diagnosis: Suicidal ideations Diagnosis:   Patient Active Problem List   Diagnosis Date Noted  . Major depressive disorder, recurrent ([place]) [F33.9] 09/10/2015  . Alcohol use disorder ([place]) [F10.99] 09/10/2015  . Cannabis use disorder, mild, abuse [F12.10] 09/10/2015  . Depression, major, severe recurrence ([place]) [F33.2] 09/10/2015  . Suicidal ideations [R45.851]   . Suicide attempt ([place]) [T14.91]   . MDD (major depressive disorder), single episode, severe with psychotic features ([place]) [F32.3] 07/31/2015   History of Present Illness:: Review of Tele assessment Note and Summarization:  IVC initiated by [employer]. Patient reports alcohol consumption today but denies drug use. She states she has SI but denies having a specific plan. She reports a prior history of suicide attempt, with laceration marks noted on her arm. Per nursing notes, patient had 2 episodes of seizure-like activity after [employer] put her into custody. Padding on railing of bed as precaution. Patient does say that she had asked police to shoot her. She said that she does not feel this way now. Patient says that she had gotten into an argument with fiance because he was ignoring her. She says, "No body likes to be ignored, right?" Patient denies current intention or plan to kill herself. Patient does have multiple attempts to kill herself however and was a patient at [facility] in November of this year. Patient says that she did drink 2 mixed drinks and one shot of liquor tonight. She denies marijuana use but is positive for it on drug screen.  On Unit Assessment:  Patient states that she was admitted to the hospital because she called the police and when they came she asked them "what do I need to do to get you to shoot  me."  Patient states "I need to start from the beginning; it all started when me and my twin sister got into and argument. Patient states that her twin has issues with her being gay and will start stuff; states that she tried to ignore sister by drinking alcohol.  States that when she and her girlfriend go back home that night she was intoxicated but wanted to talk about the incident that had happed between her and her sister with her girlfriend who did not want to talk at the time.  "I got mad cause she wouldn't;'t talk so I took her phone and threw it across the room and cracked the screen so bad she can't even hold it to her face; I punched a hole in the wall.  I called the police myself cause when I get angry like that I don't know what I might do.  When the police got there; I asked the officer what do I need to do to get you to shoot me.  That was just mad drunk talk.  I don't want to kill myself. I was just mad and drunk." Patient does have a history of self injurious behavior but states "it has been years since I branded." History of violent behave and mood swings with irritability.   At this time patient denies suicidal ideation, homicidal ideation, psychosis, and paranoia.  States that she currently has an outpatient provider and psychotropic medications.  States that her girlfriend handles her medications and that I would need to call to get the name and dosages of medications.  States that she is compliant with medications.  Spoke with nursing and nursing will call the girlfriend to get the name of medications patient is on and name of outpatient provider.        Depression Symptoms:  depressed mood, insomnia, anxiety, (Hypo) Manic Symptoms:  Impulsivity, Irritable Mood, Anxiety Symptoms:  Denies Psychotic Symptoms:  Denies PTSD Symptoms: Denies Total Time spent with patient: 1 hour  Past Psychiatric History: Has had several psychiatric admissions, but not recently, last time 2009.  Prior suicide attempt by ingesting bleach 2009. History of self cutting x 1 , 2015. Denies history of mania, denies history of psychosis, denies history of violence .  Risk to Self:   Risk to Others:   Prior Inpatient Therapy:   Prior Outpatient Therapy:    Alcohol Screening:   Substance Abuse History in the last 12 months:  Yes.   Consequences of Substance Abuse: Denies Previous Psychotropic Medications: Currently on  Abilify at 5 mg; states that it was more helpful when she was on 30 mg.  Past psychotropics that she can remember are  Depakote, but has not been on any medications for several years. States " Abilify really works".  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 History of pneumothorax ( spontaneous).   Past Medical History  Diagnosis Date  . Epilepsy ([place])   . Asthma   . Stroke ([place])   . Seizures ([place])     last seizure 07-2015    Past Surgical History  Procedure Laterality Date  . Umbilical hernia repair  12/2014   Family History:  Mother is alive , lives in  [place], father died from lung cancer two years ago, has three siblings   Family History  Problem Relation Age of Onset  . Heart failure Mother   . Cancer Father   . Seizures Father    Family Psychiatric  History: no known history of mental illness in family, history of cannabis dependence and alcohol abuse in family, no suicides in family Social History: Single, engaged to female significant other, no children, unemployed applying for disability History  Alcohol Use  . 0.6 oz/week  . 1 Cans of beer per week    Comment: occ     History  Drug Use No    Social History   Social History  . Marital Status: Single    Spouse Name: N/A  . Number of Children: N/A  . Years of Education: N/A   Social History Main Topics  . Smoking status: Current Every Day Smoker -- 0.50 packs/day    Types: Cigarettes  . Smokeless tobacco: None  . Alcohol Use:  0.6 oz/week    1 Cans of beer per week     Comment: occ  . Drug Use: No  . Sexual Activity: Yes    Birth Control/ Protection: None   Other Topics Concern  . None   Social History Narrative   ** Merged History Encounter **       Additional Social History:                         Allergies:   Allergies  Allergen Reactions  . Bee Venom Anaphylaxis and Swelling    Swelling of whole body   . Chocolate Anaphylaxis  . Mosquito (Diagnostic) Anaphylaxis and Swelling    Swelling of whole body   . Oregano [Origanum Oil] Swelling    Swelling of roof of mouth   . Bee Venom Other (See  Comments)    intolerance  . Garlic Itching and Other (See Comments)    Makes eyes burn  . Garlic Other (See Comments)    intolerance  . Orange (Diagnostic) Swelling  . Orange Fruit [Citrus] Other (See Comments)    Makes tongue bleed   . Oregano [Origanum Oil] Other (See Comments)    intolerance  . Penicillins Hives    Has patient had a PCN reaction causing immediate rash, facial/tongue/throat swelling, SOB or lightheadedness with hypotension: Yes Has patient had a PCN reaction causing severe rash involving mucus membranes or skin necrosis: Yes Has patient had a PCN reaction that required hospitalization Yes Has patient had a PCN reaction occurring within the last 10 years: No If all of the above answers are "NO", then may proceed with Cephalosporin use.   Marland Kitchen Penicillins Other (See Comments)    intolerance  . Tape   . Tape Rash   Lab Results:  Results for orders placed or performed during the hospital encounter of 09/10/15 (from the past 48 hour(s))  CBG monitoring, ED     Status: None   Collection Time: 09/10/15  3:31 AM  Result Value Ref Range   Glucose-Capillary 82 65 - 99 mg/dL  CBC with Differential     Status: Abnormal   Collection Time: 09/10/15  3:31 AM  Result Value Ref Range   WBC 4.8 4.0 - 10.5 K/uL   RBC 3.62 (L) 3.87 - 5.11 MIL/uL   Hemoglobin 11.7 (L) 12.0 - 15.0 g/dL    HCT 34.0 (L) 36.0 - 46.0 %   MCV 93.9 78.0 - 100.0 fL   MCH 32.3 26.0 - 34.0 pg   MCHC 34.4 30.0 - 36.0 g/dL   RDW 12.7 11.5 - 15.5 %   Platelets 254 150 - 400 K/uL   Neutrophils Relative % 72 %   Neutro Abs 3.4 1.7 - 7.7 K/uL   Lymphocytes Relative 18 %   Lymphs Abs 0.9 0.7 - 4.0 K/uL   Monocytes Relative 10 %   Monocytes Absolute 0.5 0.1 - 1.0 K/uL   Eosinophils Relative 0 %   Eosinophils Absolute 0.0 0.0 - 0.7 K/uL   Basophils Relative 0 %   Basophils Absolute 0.0 0.0 - 0.1 K/uL  Comprehensive metabolic panel     Status: Abnormal   Collection Time: 09/10/15  3:31 AM  Result Value Ref Range   Sodium 140 135 - 145 mmol/L   Potassium 3.3 (L) 3.5 - 5.1 mmol/L   Chloride 108 101 - 111 mmol/L   CO2 21 (L) 22 - 32 mmol/L   Glucose, Bld 89 65 - 99 mg/dL   BUN 11 6 - 20 mg/dL   Creatinine, Ser 0.96 0.44 - 1.00 mg/dL   Calcium 8.8 (L) 8.9 - 10.3 mg/dL   Total Protein 7.3 6.5 - 8.1 g/dL   Albumin 4.3 3.5 - 5.0 g/dL   AST 24 15 - 41 U/L   ALT 16 14 - 54 U/L   Alkaline Phosphatase 44 38 - 126 U/L   Total Bilirubin 0.6 0.3 - 1.2 mg/dL   GFR calc non Af Amer >60 >60 mL/min   GFR calc Af Amer >60 >60 mL/min    Comment: (NOTE) The eGFR has been calculated using the CKD EPI equation. This calculation has not been validated in all clinical situations. eGFR's persistently <60 mL/min signify possible Chronic Kidney Disease.    Anion gap 11 5 - 15  Ethanol     Status: Abnormal  Collection Time: 09/10/15  3:32 AM  Result Value Ref Range   Alcohol, Ethyl (B) 64 (H) <5 mg/dL    Comment:        LOWEST DETECTABLE LIMIT FOR SERUM ALCOHOL IS 5 mg/dL FOR MEDICAL PURPOSES ONLY   I-Stat Beta hCG blood, ED ([facility], [facility], [facility] only)     Status: None   Collection Time: 09/10/15  3:39 AM  Result Value Ref Range   I-stat hCG, quantitative <5.0 <5 mIU/mL   Comment 3            Comment:   GEST. AGE      CONC.  (mIU/mL)   <=1 WEEK        5 - 50     2 WEEKS       50 - 500     3 WEEKS       100 -  10,000     4 WEEKS     1,000 - 30,000        FEMALE AND NON-PREGNANT FEMALE:     LESS THAN 5 mIU/mL   Urine rapid drug screen (hosp performed)     Status: Abnormal   Collection Time: 09/10/15  4:14 AM  Result Value Ref Range   Opiates NONE DETECTED NONE DETECTED   Cocaine NONE DETECTED NONE DETECTED   Benzodiazepines NONE DETECTED NONE DETECTED   Amphetamines NONE DETECTED NONE DETECTED   Tetrahydrocannabinol POSITIVE (A) NONE DETECTED   Barbiturates NONE DETECTED NONE DETECTED    Comment:        DRUG SCREEN FOR MEDICAL PURPOSES ONLY.  IF CONFIRMATION IS NEEDED FOR ANY PURPOSE, NOTIFY LAB WITHIN 5 DAYS.        LOWEST DETECTABLE LIMITS FOR URINE DRUG SCREEN Drug Class       Cutoff (ng/mL) Amphetamine      1000 Barbiturate      200 Benzodiazepine   885 Tricyclics       027 Opiates          300 Cocaine          300 THC              50     Metabolic Disorder Labs:  No results found for: HGBA1C, MPG No results found for: PROLACTIN No results found for: CHOL, TRIG, HDL, CHOLHDL, VLDL, LDLCALC  Current Medications: Current Facility-Administered Medications  Medication Dose Route Frequency Provider Last Rate Last Dose  . acetaminophen (TYLENOL) tablet 650 mg  650 mg Oral Q6H PRN Harriet Butte, NP   650 mg at 09/10/15 1442  . albuterol (PROVENTIL HFA;VENTOLIN HFA) 108 (90 BASE) MCG/ACT inhaler 2 puff  2 puff Inhalation Q4H PRN Harriet Butte, NP      . alum & mag hydroxide-simeth (MAALOX/MYLANTA) 200-200-20 MG/5ML suspension 30 mL  30 mL Oral Q4H PRN Harriet Butte, NP      . ARIPiprazole (ABILIFY) tablet 5 mg  5 mg Oral Daily Harriet Butte, NP      . hydrOXYzine (ATARAX/VISTARIL) tablet 25 mg  25 mg Oral Q6H PRN Harriet Butte, NP      . hydrOXYzine (ATARAX/VISTARIL) tablet 25 mg  25 mg Oral TID PRN Harriet Butte, NP      . levETIRAcetam (KEPPRA) tablet 500 mg  500 mg Oral BID Harriet Butte, NP      . levETIRAcetam (KEPPRA) tablet 500 mg  500 mg Oral Once Nicholaus Bloom, MD      .  loratadine (CLARITIN) tablet 10 mg  10 mg Oral Daily Harriet Butte, NP      . magnesium hydroxide (MILK OF MAGNESIA) suspension 30 mL  30 mL Oral Daily PRN Harriet Butte, NP      . OXcarbazepine (TRILEPTAL) tablet 150 mg  150 mg Oral BID Harriet Butte, NP      . pantoprazole (PROTONIX) EC tablet 40 mg  40 mg Oral Daily Harriet Butte, NP      . sertraline (ZOLOFT) tablet 25 mg  25 mg Oral Daily Harriet Butte, NP       PTA Medications: Prescriptions prior to admission  Medication Sig Dispense Refill Last Dose  . albuterol (PROVENTIL HFA;VENTOLIN HFA) 108 (90 BASE) MCG/ACT inhaler Inhale 2 puffs into the lungs every 4 (four) hours as needed for wheezing or shortness of breath. 1 Inhaler 1 Past Month at Unknown time  . ARIPiprazole (ABILIFY) 5 MG tablet Take 1 tablet (5 mg total) by mouth daily. 30 tablet 0 08/28/2015 at Unknown time  . cetirizine (ZYRTEC) 10 MG tablet Take 10 mg by mouth daily as needed for allergies.    08/28/2015 at Unknown time  . dextromethorphan-guaiFENesin (MUCINEX DM) 30-600 MG 12hr tablet Take 1 tablet by mouth 2 (two) times daily.   08/28/2015 at Unknown time  . Fluticasone Furoate-Vilanterol (BREO ELLIPTA) 200-25 MCG/INH AEPB Inhale 1 puff into the lungs daily after breakfast.   08/28/2015 at Unknown time  . hydrOXYzine (ATARAX/VISTARIL) 25 MG tablet Take 1 tablet (25 mg total) by mouth every 6 (six) hours as needed for anxiety. 30 tablet 0 Past Week at Unknown time  . levETIRAcetam (KEPPRA) 500 MG tablet Take 1 tablet (500 mg total) by mouth 2 (two) times daily. 60 tablet 0 08/28/2015 at Unknown time  . omeprazole (PRILOSEC) 20 MG capsule Take 1 capsule (20 mg total) by mouth daily.   08/28/2015 at Unknown time  . OXcarbazepine (TRILEPTAL) 150 MG tablet Take 150 mg by mouth 2 (two) times daily.   08/28/2015 at Unknown time  . sertraline (ZOLOFT) 25 MG tablet Take 25 mg by mouth daily.   08/28/2015 at Unknown time    Musculoskeletal: Strength &  Muscle Tone: within normal limits Gait & Station: normal Patient leans: N/A  Psychiatric Specialty Exam: Physical Exam  Nursing note reviewed. Constitutional: She is oriented to person, place, and time. She appears well-developed.  HENT:  Head: Normocephalic.  Eyes: Pupils are equal, round, and reactive to light.  Neck: Normal range of motion.  Cardiovascular: Normal rate.   Respiratory: Effort normal.  GI: Soft.  Genitourinary:  Denies any issues in this area  Musculoskeletal: Normal range of motion.  Neurological: She is alert and oriented to person, place, and time. Coordination and gait normal.  Skin: Skin is warm and dry.  Psychiatric: Thought content normal. Her mood appears anxious. Cognition and memory are normal. She expresses impulsivity.    Review of Systems  Neurological: Positive for seizures (history).  Psychiatric/Behavioral: Positive for depression. Negative for hallucinations. Suicidal ideas: Denies at this time. Substance abuse: "Occasional weed" The patient is not nervous/anxious and does not have insomnia.     Last menstrual period 09/10/2015.There is no weight on file to calculate BMI.  General Appearance: Casual  Eye Contact::  Good  Speech:  Clear and Coherent and Normal Rate  Volume:  Normal  Mood:  Anxious and Depressed  Affect:  Congruent  Thought Process:  Circumstantial, Coherent and Goal Directed  Orientation:  Full (  Time, Place, and Person)  Thought Content:  Rumination  Suicidal Thoughts:  Denies at this time  Homicidal Thoughts:  No  Memory:  Immediate;   Fair Recent;   Fair Remote;   Fair  Judgement:  Other:  Impulsive  Insight:  Fair  Psychomotor Activity:  Normal  Concentration:  Fair  Recall:  [employer] of Knowledge:Fair  Language: Good  Akathisia:  No  Handed:  Right  AIMS (if indicated):     Assets:  Communication Skills Desire for Improvement Housing Physical Health Social Support  ADL's:  Intact  Cognition: WNL  Sleep:         Treatment Plan Summary: Daily contact with patient to assess and evaluate symptoms and progress in treatment and Medication management  Observation Level/Precautions:  15 minute checks  Laboratory Reviewed labs from [facility] Profile UDS UA Reviewed labs from ED  Psychotherapy:  Individual and group sessions  Medications:  Will restart home medications (add/adjust as appropriate for stabilization.  Increase Abilify 10 mg to better target mood stabilization  Consultations:  Psychiatry    Discharge Concerns:  Safety, stabilization, and access to medication  Estimated LOS:5-7 days  Other:      I certify that inpatient services furnished can reasonably be expected to improve the patient's condition.   Earleen Newport, FNP-BC 12/26/20164:49 PM Patient case reviewed with NP and patient seen by me Agree with NP Note and Assessment 34 year old female . Known to our unit from recent psychiatric admission in November for depression and suicidal gesture by overdose. States she had been " doing fine ", until she recently had a confrontation with a sister . States " before I saw her all was good ". She states " I think my sister started trying to rile me up on purpose". States she normally does not drink or use drugs but on that occasion did drink " about three shots " and used cannabis .  States " I thought the weed was going to help but it made me feel worse ".  Patient states she is gaining insight into " bad things happening when I drink, even though I do not drink often" States she contacted to police because she felt herself " about to snap" and upon their arrival does admit she asked them to shoot her, which she now states " it was the alcohol and the drugs that made me say that, I do not want to die." She was then admitted on commitment . Dx- Suicidal ideations . Alcohol Abuse . Seizure Disorder by history. Plan - inpatient admission. Continue Keppra, Trileptal, started on low  dose Zoloft. Encourage , focus on early recovery and relapse prevention efforts .

## 2015-09-10 NOTE — ED Notes (Signed)
Pt from home via EMS and escorted by police. Pt is being IVC'ed for SI. Pt asked [employer] to shoot her repeatedly. Pt is sleeping at this time. Pt had 2 episodes of seizure like activity after [employer] put her into custody. Pt was responsive to EMS staff during her last episode. She was instructed to stop moving her right arm during an episode and she followed command.

## 2015-09-10 NOTE — Progress Notes (Signed)
Nursing Progress Note  Nurse calling patient's girlfriend/roommate for accurate prescription and dosages of her medications which she read to Nurse over the phone.  Roommate, [place], states the incident that got patient admittted "was really nothing", stating she and patient were having a disagreement and patient calleld the police "to help her move her stuff out". When police arrived, patient had locked herself in the bedroom not letting anyone in, stating "well you can shoot through the door if you want to but i'm standing here". Roommate concurs with patient that patient tends to lose control and make poor decisions and both state that patient is not able to tolerate alcholol and in fact does not drink etoh very often.

## 2015-09-10 NOTE — ED Notes (Signed)
Tina [name]

## 2015-09-10 NOTE — Tx Team (Signed)
Initial Interdisciplinary Treatment Plan   PATIENT STRESSORS: Financial difficulties Health problems Medication change or noncompliance Substance abuse   PATIENT STRENGTHS: Ability for insight Average or above average intelligence Capable of independent living Communication skills Motivation for treatment/growth   PROBLEM LIST: Problem List/Patient Goals Date to be addressed Date deferred Reason deferred Estimated date of resolution  "coping skills" 09/10/15           "smoking cessation" 09/10/15           Substance Abuse 09/10/15           Depression 09/10/15           Alcohol Abuse 09/10/15      DISCHARGE CRITERIA:  Ability to meet basic life and health needs Adequate post-discharge living arrangements Improved stabilization in mood, thinking, and/or behavior Verbal commitment to aftercare and medication compliance  PRELIMINARY DISCHARGE PLAN: Attend aftercare/continuing care group Outpatient therapy Return to previous living arrangement  PATIENT/FAMIILY INVOLVEMENT: This treatment plan has been presented to and reviewed with the patient, Tina [name].  The patient and family have been given the opportunity to ask questions and make suggestions.  Mart Piggs 09/10/2015, 6:34 PM

## 2015-09-10 NOTE — Progress Notes (Signed)
Pt accepted to [facility] bed 303-1 by Dr. Sabra Heck. Report number is [phone]. Pt can arrive 9:30am this morning.   Spoke with [facility] re: pt's acceptance at [facility].  Sharren Bridge, MSW, LCSW Clinical Social Work, Disposition  09/10/2015 [phone]
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
## 2015-09-10 NOTE — ED Notes (Addendum)
Pt moved to Rm 33. Pt belonds placed in locker 33. Pt placed in scrubs and wanded
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
## 2015-09-11 DIAGNOSIS — F332 Major depressive disorder, recurrent severe without psychotic features: Principal | ICD-10-CM

## 2015-09-11 MED ORDER — FLUTICASONE FUROATE-VILANTEROL 200-25 MCG/INH IN AEPB
1.0000 | INHALATION_SPRAY | Freq: Every day | RESPIRATORY_TRACT | Status: DC
  Administered 2015-09-12: 1 via RESPIRATORY_TRACT
  Filled 2015-09-11 (×8): qty 60

## 2015-09-11 MED ORDER — TRAZODONE HCL 50 MG PO TABS
50.0000 mg | ORAL_TABLET | Freq: Every day | ORAL | Status: DC
  Administered 2015-09-11: 50 mg via ORAL
  Filled 2015-09-11 (×3): qty 1

## 2015-09-11 NOTE — Progress Notes (Signed)
D: Patient alert and oriented x 4. Patient denies pain/SI/HI/AVH. Patient reports she is having trust issues even though her current GF has never cheated.  A: Staff to monitor Q 15 mins for safety. Encouragement and support offered. Scheduled medications administered per orders. R: Patient remains safe on the unit. Patient attended group tonight. Patient visible on hte unit and interacting with peers. Patient taking administered medications.
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
## 2015-09-11 NOTE — BHH Group Notes (Signed)
[place] LCSW Group Therapy  Type of Therapy:  Group Therapy  Participation Level:  Active  Participation Quality:  Appropriate and Attentive  Affect:  Appropriate  Cognitive:  Alert, Appropriate and Oriented  Insight:  Developing/Improving  Engagement in Therapy:  Developing/Improving  Modes of Intervention:  Discussion, Education, Exploration, Orientation and Support  Summary of Progress/Problems: [facility]HA Speaker came to talk about his personal journey with substance abuse and addiction. The pt processed ways by which to relate to the speaker. [name] speaker provided handouts and educational information pertaining to groups and services offered by the [facility].   [name] 09/11/2015, 2:43 PM

## 2015-09-11 NOTE — Progress Notes (Signed)
Patient attended AA group.   

## 2015-09-11 NOTE — Progress Notes (Signed)
DAR NOTE: Patient presents with anxious affect and depressed mood.  Denies auditory and visual hallucinations.  Rates depression at 0, hopelessness at 0, and anxiety at 0.  Describes energy level as normal and concentration as good.  Maintained on routine safety checks.  Medications given as prescribed.  Support and encouragement offered as needed.  Attended group and participated.  States goal for today is "finding peace in myself and anger controlling goals."  Patient observed socializing with peers in the dayroom.  Complain of headache but refused medication when offered.

## 2015-09-11 NOTE — Tx Team (Signed)
Interdisciplinary Treatment Plan Update (Adult)  Date:  09/11/2015  Time Reviewed:  8:36 AM   Progress in Treatment: Attending groups: No. Participating in groups:  No. Taking medication as prescribed:  Yes. Tolerating medication:  Yes. Family/Significant othe contact made:  SPE required for this pt.  Patient understands diagnosis:  Yes. and As evidenced by:  seeking treatment for SI, Alcohol abuse, self mutiliation, and for medication stabilization. Discussing patient identified problems/goals with staff:  Yes. Medical problems stabilized or resolved:  Yes. Denies suicidal/homicidal ideation: Yes. Issues/concerns per patient self-inventory:  Other:  Discharge Plan or Barriers: CSW assessing for appropriate referrals. Pt plans to follow-up at [facility] outpatient for med management and counseling. She had been going to neuropsych but did not want to return.   Reason for Continuation of Hospitalization: Depression Medication stabilization Withdrawal symptoms  Comments:  Tina [name] is an 34 y.o. female.. Patient was brought to [facility] by [employer] on IVC initiated by [employer]. Patient reports alcohol consumption today but denies drug use. She states she has SI but denies having a specific plan. She reports a prior history of suicide attempt, with laceration marks noted on her arm. Per nursing notes, patient had 2 episodes of seizure-like activity after [employer] put her into custody. Padding on railing of bed as precaution. Patient does say that she had asked police to shoot her. She said that she does not feel this way now. Patient says that she had gotten into an argument with fiance because he was ignoring her. She says, "No body likes to be ignored, right?" Patient denies current intention or plan to kill herself. Patient does have multiple attempts to kill herself however and was a patient at [facility] in November of this year. Patient says that she did drink 2 mixed drinks and one shot  of liquor tonight. She denies marijuana use but is positive for it on drug screen.Pt acknowledges having a seizure d/o. She is very sleepy and has to be awakened several times during assessment. Patient is able to name many of her medications but cannot say when the last time she used them was. Patient also says that she gets her prescriptions prescribed "by that place on [address] She does not know where so she is more than likely non-compliant with meds. Patient denies any current HI or A/V hallucinations. Diagnosis: MDD recurrent severe; THC use d/o mild; ETOH use d/o mild Note: Pt was admitted 11/16 for similar issues.   Estimated length of stay:  1-2 days    New goal(s): to develop effective aftercare plan.   Additional Comments:  Patient and CSW reviewed pt's identified goals and treatment plan. Patient verbalized understanding and agreed to treatment plan. CSW reviewed [facility] "Discharge Process and Patient Involvement" Form. Pt verbalized understanding of information provided and signed form.    Review of initial/current patient goals per problem list:  1. Goal(s): Patient will participate in aftercare plan  Met: Yes  Target date: at discharge  As evidenced by: Patient will participate within aftercare plan AEB aftercare provider and housing plan at discharge being identified.  12/28: Pt plans to return home with her fiance and will follow-up at [facility] for outpatient services   2. Goal (s): Patient will exhibit decreased depressive symptoms and suicidal ideations.  Met: No.    Target date: at discharge  As evidenced by: Patient will utilize self rating of depression at 3 or below and demonstrate decreased signs of depression or be deemed stable for discharge  by MD.  12/27: Pt rates depression as 4/10 and presents with pleasant mood. She denies SI/HI/AVH and appears to be nearing her baseline. Goal progressing.   3. Goal(s): Patient will demonstrate decreased  signs of withdrawal due to substance abuse  Met:Yes  Target date:at discharge   As evidenced by: Patient will produce a CIWA/COWS score of 0, have stable vitals signs, and no symptoms of withdrawal.  12/27: Pt denies withdrawal symtpoms. CIWA and COWS of 0 today and stable vitals.   Attendees: Patient:   09/11/2015 8:36 AM   Family:   09/11/2015 8:36 AM   Physician:  Dr. Carlton Adam, MD 09/11/2015 8:36 AM   Nursing:   Beola Cord RN 09/11/2015 8:36 AM   Clinical Social Worker: Maxie Better, LCSW 09/11/2015 8:36 AM   Clinical Social Worker: Erasmo Downer Drinkard LCSWA; Peri Maris LCSWA 09/11/2015 8:36 AM   Other:  Gerline Legacy Nurse Case Manager 09/11/2015 8:36 AM   Other:  Agustina Caroli NP  09/11/2015 8:36 AM   Other:   09/11/2015 8:36 AM   Other:  09/11/2015 8:36 AM   Other:  09/11/2015 8:36 AM   Other:  09/11/2015 8:36 AM    09/11/2015 8:36 AM    09/11/2015 8:36 AM    09/11/2015 8:36 AM    09/11/2015 8:36 AM    Scribe for Treatment Team:   Maxie Better, LCSW 09/11/2015 8:36 AM

## 2015-09-11 NOTE — BHH Group Notes (Signed)
[facility] Group Notes:  (Nursing/MHT/Case Management/Adjunct)  Date:  09/11/2015  Time:  10:17 AM  Type of Therapy:  Nurse Education  Participation Level:  Active  Participation Quality:  Appropriate and Attentive  Affect:  Appropriate  Cognitive:  Alert and Appropriate  Insight:  Appropriate and Good  Engagement in Group:  Engaged, Improving and Supportive  Modes of Intervention:  Discussion, Education and Exploration  Summary of Progress/Problems: Topic was on recovery. Discussed the recovery process and that it is individualized. Group encouraged to stay and continue with the process even when they fall short or reach a road block. Patient was attentive and receptive.  Patient states, I'm here to work on my anger and to learn new coping skills."  Patient encouraged others in the group to attend groups. Mart Piggs 09/11/2015, 10:17 AM

## 2015-09-11 NOTE — Progress Notes (Signed)
[facility] MD Progress Note  09/11/2015 4:33 PM Tina [name]  MRN:  [id number]  Subjective:  Tina [name] reports, "I'm tired & sleepy. I'm here because I said something very selfish. I feel a lot better today, just not sleeping well".  Principal Problem: Suicidal ideations  Diagnosis:   Patient Active Problem List   Diagnosis Date Noted  . Major depressive disorder, recurrent ([place]) [F33.9] 09/10/2015  . Alcohol use disorder ([place]) [F10.99] 09/10/2015  . Cannabis use disorder, mild, abuse [F12.10] 09/10/2015  . Depression, major, severe recurrence ([place]) [F33.2] 09/10/2015  . Suicidal ideations [R45.851]   . Suicide attempt ([place]) [T14.91]   . MDD (major depressive disorder), single episode, severe with psychotic features ([place]) [F32.3] 07/31/2015   Total Time spent with patient: 25 minutes  Past Psychiatric History: MDD, Polysubstance abuse  Past Medical History:  Past Medical History  Diagnosis Date  . Epilepsy ([place])   . Asthma   . Stroke ([place])   . Seizures ([place])     last seizure 07-2015    Past Surgical History  Procedure Laterality Date  . Umbilical hernia repair  12/2014   Family History:  Family History  Problem Relation Age of Onset  . Heart failure Mother   . Cancer Father   . Seizures Father    Family Psychiatric  History: See H&P  Social History:  History  Alcohol Use  . 0.6 oz/week  . 1 Cans of beer per week    Comment: occ     History  Drug Use No    Social History   Social History  . Marital Status: Single    Spouse Name: N/A  . Number of Children: N/A  . Years of Education: N/A   Social History Main Topics  . Smoking status: Current Every Day Smoker -- 0.50 packs/day    Types: Cigarettes  . Smokeless tobacco: None  . Alcohol Use: 0.6 oz/week    1 Cans of beer per week     Comment: occ  . Drug Use: No  . Sexual Activity: Yes    Birth Control/ Protection: None   Other Topics Concern  . None   Social History Narrative   ** Merged  History Encounter **       Additional Social History:                         Sleep: Fair  Appetite:  Fair  Current Medications: Current Facility-Administered Medications  Medication Dose Route Frequency Provider Last Rate Last Dose  . acetaminophen (TYLENOL) tablet 650 mg  650 mg Oral Q6H PRN Harriet Butte, NP   650 mg at 09/10/15 2042  . albuterol (PROVENTIL HFA;VENTOLIN HFA) 108 (90 BASE) MCG/ACT inhaler 2 puff  2 puff Inhalation Q4H PRN Harriet Butte, NP   2 puff at 09/11/15 1320  . alum & mag hydroxide-simeth (MAALOX/MYLANTA) 200-200-20 MG/5ML suspension 30 mL  30 mL Oral Q4H PRN Harriet Butte, NP   30 mL at 09/11/15 1321  . ARIPiprazole (ABILIFY) tablet 10 mg  10 mg Oral Daily Shuvon B Rankin, NP   10 mg at 09/11/15 0814  . [START ON 09/12/2015] Fluticasone Furoate-Vilanterol 200-25 MCG/INH AEPB 1 puff  1 puff Inhalation QPC breakfast Encarnacion Slates, NP      . hydrOXYzine (ATARAX/VISTARIL) tablet 25 mg  25 mg Oral TID PRN Shuvon B Rankin, NP   25 mg at 09/11/15 1132  . levETIRAcetam (KEPPRA) tablet 500 mg  500 mg Oral BID Harriet Butte, NP   500 mg at 09/11/15 0174  . levETIRAcetam (KEPPRA) tablet 500 mg  500 mg Oral Once Nicholaus Bloom, MD   500 mg at 09/10/15 1800  . loratadine (CLARITIN) tablet 10 mg  10 mg Oral Daily Harriet Butte, NP   10 mg at 09/11/15 0814  . magnesium hydroxide (MILK OF MAGNESIA) suspension 30 mL  30 mL Oral Daily PRN Harriet Butte, NP      . OXcarbazepine (TRILEPTAL) tablet 150 mg  150 mg Oral BID Harriet Butte, NP   150 mg at 09/11/15 0814  . pantoprazole (PROTONIX) EC tablet 40 mg  40 mg Oral Daily Harriet Butte, NP   40 mg at 09/11/15 0814  . sertraline (ZOLOFT) tablet 25 mg  25 mg Oral Daily Harriet Butte, NP   25 mg at 09/11/15 9449    Lab Results:  Results for orders placed or performed during the hospital encounter of 09/10/15 (from the past 48 hour(s))  CBG monitoring, ED     Status: None   Collection Time: 09/10/15   3:31 AM  Result Value Ref Range   Glucose-Capillary 82 65 - 99 mg/dL  CBC with Differential     Status: Abnormal   Collection Time: 09/10/15  3:31 AM  Result Value Ref Range   WBC 4.8 4.0 - 10.5 K/uL   RBC 3.62 (L) 3.87 - 5.11 MIL/uL   Hemoglobin 11.7 (L) 12.0 - 15.0 g/dL   HCT 34.0 (L) 36.0 - 46.0 %   MCV 93.9 78.0 - 100.0 fL   MCH 32.3 26.0 - 34.0 pg   MCHC 34.4 30.0 - 36.0 g/dL   RDW 12.7 11.5 - 15.5 %   Platelets 254 150 - 400 K/uL   Neutrophils Relative % 72 %   Neutro Abs 3.4 1.7 - 7.7 K/uL   Lymphocytes Relative 18 %   Lymphs Abs 0.9 0.7 - 4.0 K/uL   Monocytes Relative 10 %   Monocytes Absolute 0.5 0.1 - 1.0 K/uL   Eosinophils Relative 0 %   Eosinophils Absolute 0.0 0.0 - 0.7 K/uL   Basophils Relative 0 %   Basophils Absolute 0.0 0.0 - 0.1 K/uL  Comprehensive metabolic panel     Status: Abnormal   Collection Time: 09/10/15  3:31 AM  Result Value Ref Range   Sodium 140 135 - 145 mmol/L   Potassium 3.3 (L) 3.5 - 5.1 mmol/L   Chloride 108 101 - 111 mmol/L   CO2 21 (L) 22 - 32 mmol/L   Glucose, Bld 89 65 - 99 mg/dL   BUN 11 6 - 20 mg/dL   Creatinine, Ser 0.96 0.44 - 1.00 mg/dL   Calcium 8.8 (L) 8.9 - 10.3 mg/dL   Total Protein 7.3 6.5 - 8.1 g/dL   Albumin 4.3 3.5 - 5.0 g/dL   AST 24 15 - 41 U/L   ALT 16 14 - 54 U/L   Alkaline Phosphatase 44 38 - 126 U/L   Total Bilirubin 0.6 0.3 - 1.2 mg/dL   GFR calc non Af Amer >60 >60 mL/min   GFR calc Af Amer >60 >60 mL/min    Comment: (NOTE) The eGFR has been calculated using the CKD EPI equation. This calculation has not been validated in all clinical situations. eGFR's persistently <60 mL/min signify possible Chronic Kidney Disease.    Anion gap 11 5 - 15  Ethanol     Status: Abnormal   Collection  Time: 09/10/15  3:32 AM  Result Value Ref Range   Alcohol, Ethyl (B) 64 (H) <5 mg/dL    Comment:        LOWEST DETECTABLE LIMIT FOR SERUM ALCOHOL IS 5 mg/dL FOR MEDICAL PURPOSES ONLY   I-Stat Beta hCG blood, ED ([facility], [facility],  [facility] only)     Status: None   Collection Time: 09/10/15  3:39 AM  Result Value Ref Range   I-stat hCG, quantitative <5.0 <5 mIU/mL   Comment 3            Comment:   GEST. AGE      CONC.  (mIU/mL)   <=1 WEEK        5 - 50     2 WEEKS       50 - 500     3 WEEKS       100 - 10,000     4 WEEKS     1,000 - 30,000        FEMALE AND NON-PREGNANT FEMALE:     LESS THAN 5 mIU/mL   Urine rapid drug screen (hosp performed)     Status: Abnormal   Collection Time: 09/10/15  4:14 AM  Result Value Ref Range   Opiates NONE DETECTED NONE DETECTED   Cocaine NONE DETECTED NONE DETECTED   Benzodiazepines NONE DETECTED NONE DETECTED   Amphetamines NONE DETECTED NONE DETECTED   Tetrahydrocannabinol POSITIVE (A) NONE DETECTED   Barbiturates NONE DETECTED NONE DETECTED    Comment:        DRUG SCREEN FOR MEDICAL PURPOSES ONLY.  IF CONFIRMATION IS NEEDED FOR ANY PURPOSE, NOTIFY LAB WITHIN 5 DAYS.        LOWEST DETECTABLE LIMITS FOR URINE DRUG SCREEN Drug Class       Cutoff (ng/mL) Amphetamine      1000 Barbiturate      200 Benzodiazepine   800 Tricyclics       349 Opiates          300 Cocaine          300 THC              50     Physical Findings: AIMS: Facial and Oral Movements Muscles of Facial Expression: None, normal Lips and Perioral Area: None, normal Jaw: None, normal Tongue: None, normal,Extremity Movements Upper (arms, wrists, hands, fingers): None, normal Lower (legs, knees, ankles, toes): None, normal, Trunk Movements Neck, shoulders, hips: None, normal, Overall Severity Severity of abnormal movements (highest score from questions above): None, normal Incapacitation due to abnormal movements: None, normal Patient's awareness of abnormal movements (rate only patient's report): No Awareness, Dental Status Current problems with teeth and/or dentures?: No Does patient usually wear dentures?: No  CIWA:  CIWA-Ar Total: 0 COWS:     Musculoskeletal: Strength & Muscle Tone: within  normal limits Gait & Station: normal Patient leans: N/A  Psychiatric Specialty Exam: Review of Systems  Constitutional: Negative.   HENT: Negative.   Eyes: Negative.   Respiratory: Negative.   Cardiovascular: Negative.   Gastrointestinal: Negative.   Genitourinary: Negative.   Musculoskeletal: Negative.   Skin: Negative.   Neurological: Negative.   Endo/Heme/Allergies: Negative.   Psychiatric/Behavioral: Positive for depression ("Improving") and substance abuse (Alcoholism, THC abuse). Negative for suicidal ideas, hallucinations and memory loss. The patient has insomnia ("Improving"). The patient is not nervous/anxious.     Blood pressure 107/78, pulse 91, temperature 98.1 F (36.7 C), temperature source Oral, resp. rate 16, height _0  (  1.626 m), weight 60.782 kg (134 lb), last menstrual period 09/10/2015, SpO2 100 %.Body mass index is 22.99 kg/(m^2).  General Appearance: Casual  Eye Contact:: Good  Speech: Clear and Coherent and Normal Rate  Volume: Normal  Mood: Anxious and Depressed, but "improving"  Affect: Congruent  Thought Process:Coherent and Goal Directed  Orientation: Full (Time, Place, and Person)  Thought Content: Rumination, denies any hallucinations  Suicidal Thoughts: Denies at this time  Homicidal Thoughts: No  Memory: Immediate; Fair Recent; Fair Remote; Fair  Judgement: Other: Impulsive  Insight: Fair  Psychomotor Activity: Normal  Concentration: Fair  Recall: [employer] of Knowledge:Fair  Language: Good  Akathisia: No  Handed: Right  AIMS (if indicated):    Assets: Communication Skills Desire for Improvement Housing Physical Health Social Support  ADL's: Intact  Cognition: WNL  Sleep:4          Treatment Plan Summary:2 1. Continue crisis management, mood stabilization & relapse prevention.. 2. Continue current medication management to reduce current symptoms to base line and improve  the  patient's overall level of functioning; Abilify 10 mg for depression, Hydroxyzine 25 mg for anxiety, Trileptal 150 mg for mood stabilization, Sertraline 25 mg for depression. Initiate Trazodone 50 mg for sleep. 3. Treat health problems as indicated; resumed & continue all pertinent home medications. 4. Develop treatment plan to enhance medication adeherance upon discharge & prevent the need for  readmission. 5. Psycho-social education regarding relapse prevention and self care.  Lindell Spar I, PMHNP, FNP-BC 09/11/2015, 4:33 PM

## 2015-09-12 DIAGNOSIS — R45851 Suicidal ideations: Secondary | ICD-10-CM

## 2015-09-12 MED ORDER — ARIPIPRAZOLE 10 MG PO TABS: 10.0000 mg | ORAL_TABLET | Freq: Every day | ORAL | Status: DC

## 2015-09-12 MED ORDER — FLUTICASONE FUROATE-VILANTEROL 200-25 MCG/INH IN AEPB: 1.0000 | INHALATION_SPRAY | Freq: Every day | RESPIRATORY_TRACT | Status: DC

## 2015-09-12 MED ORDER — CETIRIZINE HCL 10 MG PO TABS: 10.0000 mg | ORAL_TABLET | Freq: Every day | ORAL | Status: DC | PRN

## 2015-09-12 MED ORDER — SERTRALINE HCL 25 MG PO TABS: 25.0000 mg | ORAL_TABLET | Freq: Every day | ORAL | Status: DC

## 2015-09-12 MED ORDER — OXCARBAZEPINE 150 MG PO TABS: 150.0000 mg | ORAL_TABLET | Freq: Two times a day (BID) | ORAL | Status: DC

## 2015-09-12 MED ORDER — OMEPRAZOLE 20 MG PO CPDR: 20.0000 mg | DELAYED_RELEASE_CAPSULE | Freq: Every day | ORAL | Status: DC

## 2015-09-12 MED ORDER — LEVETIRACETAM 500 MG PO TABS: 500.0000 mg | ORAL_TABLET | Freq: Two times a day (BID) | ORAL | Status: DC

## 2015-09-12 MED ORDER — HYDROXYZINE HCL 25 MG PO TABS: 25.0000 mg | ORAL_TABLET | Freq: Four times a day (QID) | ORAL | Status: DC | PRN

## 2015-09-12 MED ORDER — TRAZODONE HCL 50 MG PO TABS: 50.0000 mg | ORAL_TABLET | Freq: Every day | ORAL | Status: DC

## 2015-09-12 MED ORDER — ALBUTEROL SULFATE HFA 108 (90 BASE) MCG/ACT IN AERS: 2.0000 | INHALATION_SPRAY | RESPIRATORY_TRACT | Status: DC | PRN

## 2015-09-12 NOTE — BHH Suicide Risk Assessment (Signed)
[facility] INPATIENT:  Family/Significant Other Suicide Prevention Education  Suicide Prevention Education:  Education Completed; [name] (pt's fiance) [phone] has been identified by the patient as the family member/significant other with whom the patient will be residing, and identified as the person(s) who will aid the patient in the event of a mental health crisis (suicidal ideations/suicide attempt).  With written consent from the patient, the family member/significant other has been provided the following suicide prevention education, prior to the and/or following the discharge of the patient.  The suicide prevention education provided includes the following:  Suicide risk factors  Suicide prevention and interventions  National Suicide Hotline telephone number  [facility] assessment telephone number  [facility] Emergency Assistance [place] and/or Residential Mobile Crisis Unit telephone number  Request made of family/significant other to:  Remove weapons (e.g., guns, rifles, knives), all items previously/currently identified as safety concern.    Remove drugs/medications (over-the-counter, prescriptions, illicit drugs), all items previously/currently identified as a safety concern.  The family member/significant other verbalizes understanding of the suicide prevention education information provided.  The family member/significant other agrees to remove the items of safety concern listed above.  Smart, Muhannad Bignell LCSW 09/12/2015, 10:16 AM

## 2015-09-12 NOTE — Progress Notes (Signed)
  [facility] Adult Case Management Discharge Plan :  Will you be returning to the same living situation after discharge:  Yes,  home with fiance and her 3 children At discharge, do you have transportation home?: Yes,  fiance coming at 11am  Do you have the ability to pay for your medications: Yes,  [employer] private insurance  Release of information consent forms completed and submitted to medical records by CSW.  Patient to Follow up at: Follow-up Information    Follow up with [facility]  On 10/25/2015.   Why:  Arrive at 10:00AM to complete new patient paperwork. Medication management appt at 11:00AM with Dr. Areta Haber information:   [address] [place], [place] [address] Phone: [phone] Fax: [phone]      Follow up with [facility] On 10/29/2015.   Why:  Appt on this date 9:00AM for counseling. Please arrive 15 minutes early for appt.    Contact information:   [place], [place] [address] Phone: [phone] Fax: [phone]      Next level of care provider has access to [place] and Suicide Prevention discussed: Yes,  SPE completed with pt's fiance. PT also given SPI pamphlet and mobile crisis information and was encouraged to share this information with her support network.   Have you used any form of tobacco in the last 30 days? (Cigarettes, Smokeless Tobacco, Cigars, and/or Pipes): Yes  Has patient been referred to the Quitline?: Patient refused referral  Patient has been referred for addiction treatment: Yes, pt refused. She wants to focus on mental illness/depression and does not admit to substance abuse problem at this time. Pt has been given AA/NA list for [place] and Mental health Association pamphlet.   Smart, Kal Chait LCSW 09/12/2015, 10:16 AM

## 2015-09-12 NOTE — Progress Notes (Signed)
Patient ID: Tina [name], female   DOB: [birth year], 34 y.o.   MRN: [id number] NSG D/C Note:Pt denies si/hi at this time. States that she will comply with outpt services and take her meds as prescribed. D/C to lobby with friend providing transport home.

## 2015-09-12 NOTE — Tx Team (Signed)
Interdisciplinary Treatment Plan Update (Adult)  Date:  09/12/2015  Time Reviewed:  10:13 AM   Progress in Treatment: Attending groups: Yes Participating in groups:  Yes Taking medication as prescribed:  Yes. Tolerating medication:  Yes. Family/Significant othe contact made:  SPE required for this pt.  Patient understands diagnosis:  Yes. and As evidenced by:  seeking treatment for SI, Alcohol abuse, self mutiliation, and for medication stabilization. Discussing patient identified problems/goals with staff:  Yes. Medical problems stabilized or resolved:  Yes. Denies suicidal/homicidal ideation: Yes. Issues/concerns per patient self-inventory:  Other:  Discharge Plan or Barriers: Pt has follow-up in place at [facility] for med management and cousneling. Pt aware that appts are in Feb and states that she does not want any additional follow-up and is comfortable with appts scheduled. Pt encouraged to call office to be put on cancellation list. Pt given Mental Health Association information and AA/NA list.   Reason for Continuation of Hospitalization: none  Comments:  Tina [name] is an 34 y.o. female.. Patient was brought to [facility] by [employer] on IVC initiated by [employer]. Patient reports alcohol consumption today but denies drug use. She states she has SI but denies having a specific plan. She reports a prior history of suicide attempt, with laceration marks noted on her arm. Per nursing notes, patient had 2 episodes of seizure-like activity after [employer] put her into custody. Padding on railing of bed as precaution. Patient does say that she had asked police to shoot her. She said that she does not feel this way now. Patient says that she had gotten into an argument with fiance because he was ignoring her. She says, "No body likes to be ignored, right?" Patient denies current intention or plan to kill herself. Patient does have multiple attempts to kill herself however and was  a patient at [facility] in November of this year. Patient says that she did drink 2 mixed drinks and one shot of liquor tonight. She denies marijuana use but is positive for it on drug screen.Pt acknowledges having a seizure d/o. She is very sleepy and has to be awakened several times during assessment. Patient is able to name many of her medications but cannot say when the last time she used them was. Patient also says that she gets her prescriptions prescribed "by that place on [address] She does not know where so she is more than likely non-compliant with meds. Patient denies any current HI or A/V hallucinations. Diagnosis: MDD recurrent severe; THC use d/o mild; ETOH use d/o mild Note: Pt was admitted 11/16 for similar issues.   Estimated length of stay:  D/c today   Additional Comments:  Patient and CSW reviewed pt's identified goals and treatment plan. Patient verbalized understanding and agreed to treatment plan. CSW reviewed [facility] "Discharge Process and Patient Involvement" Form. Pt verbalized understanding of information provided and signed form.    Review of initial/current patient goals per problem list:  1. Goal(s): Patient will participate in aftercare plan  Met: Yes  Target date: at discharge  As evidenced by: Patient will participate within aftercare plan AEB aftercare provider and housing plan at discharge being identified.  12/28: Pt plans to return home with her fiance and will follow-up at [facility] for outpatient services   2. Goal (s): Patient will exhibit decreased depressive symptoms and suicidal ideations.  Met: Yes    Target date: at discharge  As evidenced by: Patient will utilize self rating of depression at 3 or  below and demonstrate decreased signs of depression or be deemed stable for discharge by MD.  12/27: Pt rates depression as 4/10 and presents with pleasant mood. She denies SI/HI/AVH and appears to be nearing her baseline. Goal progressing.    12/28: Pt rates depression as 0/10 and presents with pleasant mood and excited affect. Pt reports that she is happy to be seeing her children today.   3. Goal(s): Patient will demonstrate decreased signs of withdrawal due to substance abuse  Met:Yes  Target date:at discharge   As evidenced by: Patient will produce a CIWA/COWS score of 0, have stable vitals signs, and no symptoms of withdrawal.  12/27: Pt denies withdrawal symtpoms. CIWA and COWS of 0 today and stable vitals.   Attendees: Patient:   09/12/2015 10:13 AM   Family:   09/12/2015 10:13 AM   Physician:  Dr. Carlton Adam, MD 09/12/2015 10:13 AM   Nursing:   Rutherford Guys RN 09/12/2015 10:13 AM   Clinical Social Worker: Maxie Better, LCSW 09/12/2015 10:13 AM   Clinical Social Worker: Erasmo Downer Drinkard LCSWA; Peri Maris Brigid Re LCSW 09/12/2015 10:13 AM   Other:   09/12/2015 10:13 AM   Other:  Agustina Caroli NP  09/12/2015 10:13 AM   Other:   09/12/2015 10:13 AM   Other:  09/12/2015 10:13 AM   Other:  09/12/2015 10:13 AM   Other:  09/12/2015 10:13 AM    09/12/2015 10:13 AM    09/12/2015 10:13 AM    09/12/2015 10:13 AM    09/12/2015 10:13 AM    Scribe for Treatment Team:   Maxie Better, LCSW 09/12/2015 10:13 AM
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
## 2015-09-12 NOTE — Progress Notes (Signed)
D: Patient observed with visitor early in shift. Patient states her goal was to "control my anger." Patient states she has been "writing in journal and drawing" which has helped her anger. Patient with pleasant mood. Patient attended group. A: Support and encouragement offered. Q 15 minute checks in progress and maintained. Medications administered as ordered and patient response to medications monitored. R: Patient remains safe on unit. Monitoring continues.
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
## 2015-09-18 ENCOUNTER — Emergency Department (HOSPITAL_COMMUNITY)
Admission: EM | Admit: 2015-09-18 | Discharge: 2015-09-18 | Discharge status: Against Medical Advice | Payer: BLUE CROSS/BLUE SHIELD | Attending: Emergency Medicine | Admitting: Emergency Medicine

## 2015-09-18 ENCOUNTER — Encounter (HOSPITAL_COMMUNITY): Payer: Self-pay | Admitting: Emergency Medicine

## 2015-09-18 DIAGNOSIS — R1084 Generalized abdominal pain: Secondary | ICD-10-CM | POA: Insufficient documentation

## 2015-09-18 DIAGNOSIS — J45909 Unspecified asthma, uncomplicated: Secondary | ICD-10-CM | POA: Insufficient documentation

## 2015-09-18 DIAGNOSIS — F17210 Nicotine dependence, cigarettes, uncomplicated: Secondary | ICD-10-CM | POA: Insufficient documentation

## 2015-09-18 LAB — CBC
HEMATOCRIT: 36.9 % (ref 36.0–46.0)
HEMOGLOBIN: 12.4 g/dL (ref 12.0–15.0)
MCH: 32 pg (ref 26.0–34.0)
MCHC: 33.6 g/dL (ref 30.0–36.0)
MCV: 95.1 fL (ref 78.0–100.0)
Platelets: 276 10*3/uL (ref 150–400)
RBC: 3.88 MIL/uL (ref 3.87–5.11)
RDW: 12.8 % (ref 11.5–15.5)
WBC: 6.6 10*3/uL (ref 4.0–10.5)

## 2015-09-18 LAB — COMPREHENSIVE METABOLIC PANEL
ALBUMIN: 4.3 g/dL (ref 3.5–5.0)
ALT: 17 U/L (ref 14–54)
ANION GAP: 9 (ref 5–15)
AST: 23 U/L (ref 15–41)
Alkaline Phosphatase: 41 U/L (ref 38–126)
BUN: 11 mg/dL (ref 6–20)
CHLORIDE: 110 mmol/L (ref 101–111)
CO2: 22 mmol/L (ref 22–32)
Calcium: 8.9 mg/dL (ref 8.9–10.3)
Creatinine, Ser: 0.76 mg/dL (ref 0.44–1.00)
GFR calc non Af Amer: >60 mL/min (ref >60)
GLUCOSE: 80 mg/dL (ref 65–99)
POTASSIUM: 4 mmol/L (ref 3.5–5.1)
SODIUM: 141 mmol/L (ref 135–145)
Total Bilirubin: 0.5 mg/dL (ref 0.3–1.2)
Total Protein: 7.1 g/dL (ref 6.5–8.1)

## 2015-09-18 LAB — URINALYSIS, ROUTINE W REFLEX MICROSCOPIC
Bilirubin Urine: NEGATIVE
Glucose, UA: NEGATIVE mg/dL
HGB URINE DIPSTICK: NEGATIVE
Ketones, ur: NEGATIVE mg/dL
Leukocytes, UA: NEGATIVE
Nitrite: NEGATIVE
PH: 6.5 (ref 5.0–8.0)
PROTEIN: NEGATIVE mg/dL
SPECIFIC GRAVITY, URINE: 1.025 (ref 1.005–1.030)

## 2015-09-18 LAB — LIPASE, BLOOD: LIPASE: 31 U/L (ref 11–51)

## 2015-09-18 LAB — I-STAT BETA HCG BLOOD, ED (MC, WL, AP ONLY)

## 2015-09-18 NOTE — ED Notes (Signed)
Pt states that she has had generalized abdominal pain x 1 days. N/V. No diarrhea. Alert and oriented.

## 2015-09-18 NOTE — ED Notes (Signed)
Called x 1 for pt. No answer.

## 2015-09-21 ENCOUNTER — Encounter (HOSPITAL_COMMUNITY): Payer: Self-pay | Admitting: *Deleted

## 2015-09-21 ENCOUNTER — Encounter (HOSPITAL_COMMUNITY): Payer: Self-pay | Admitting: Emergency Medicine

## 2015-09-21 ENCOUNTER — Emergency Department (INDEPENDENT_AMBULATORY_CARE_PROVIDER_SITE_OTHER)
Admission: EM | Admit: 2015-09-21 | Discharge: 2015-09-21 | Disposition: A | Discharge status: Nursing Home (Includes ICF) | Payer: Self-pay | Source: Home / Self Care | Attending: Family Medicine | Admitting: Family Medicine

## 2015-09-21 ENCOUNTER — Emergency Department (HOSPITAL_COMMUNITY)
Admission: EM | Admit: 2015-09-21 | Discharge: 2015-09-21 | Disposition: A | Discharge status: Home / Self Care | Payer: BLUE CROSS/BLUE SHIELD | Attending: Emergency Medicine | Admitting: Emergency Medicine

## 2015-09-21 DIAGNOSIS — Z79899 Other long term (current) drug therapy: Secondary | ICD-10-CM | POA: Insufficient documentation

## 2015-09-21 DIAGNOSIS — F17210 Nicotine dependence, cigarettes, uncomplicated: Secondary | ICD-10-CM | POA: Insufficient documentation

## 2015-09-21 DIAGNOSIS — R109 Unspecified abdominal pain: Secondary | ICD-10-CM

## 2015-09-21 DIAGNOSIS — R1013 Epigastric pain: Secondary | ICD-10-CM | POA: Insufficient documentation

## 2015-09-21 DIAGNOSIS — R112 Nausea with vomiting, unspecified: Secondary | ICD-10-CM

## 2015-09-21 DIAGNOSIS — R197 Diarrhea, unspecified: Secondary | ICD-10-CM | POA: Insufficient documentation

## 2015-09-21 DIAGNOSIS — G40909 Epilepsy, unspecified, not intractable, without status epilepticus: Secondary | ICD-10-CM | POA: Insufficient documentation

## 2015-09-21 DIAGNOSIS — Z8673 Personal history of transient ischemic attack (TIA), and cerebral infarction without residual deficits: Secondary | ICD-10-CM | POA: Insufficient documentation

## 2015-09-21 DIAGNOSIS — J45909 Unspecified asthma, uncomplicated: Secondary | ICD-10-CM | POA: Insufficient documentation

## 2015-09-21 DIAGNOSIS — R1012 Left upper quadrant pain: Secondary | ICD-10-CM | POA: Insufficient documentation

## 2015-09-21 DIAGNOSIS — Z880 Allergy status to penicillin: Secondary | ICD-10-CM | POA: Insufficient documentation

## 2015-09-21 DIAGNOSIS — Z7951 Long term (current) use of inhaled steroids: Secondary | ICD-10-CM | POA: Insufficient documentation

## 2015-09-21 LAB — COMPREHENSIVE METABOLIC PANEL
ALBUMIN: 4 g/dL (ref 3.5–5.0)
ALK PHOS: 35 U/L — AB (ref 38–126)
ALT: 17 U/L (ref 14–54)
AST: 22 U/L (ref 15–41)
Anion gap: 10 (ref 5–15)
BILIRUBIN TOTAL: 0.8 mg/dL (ref 0.3–1.2)
BUN: 12 mg/dL (ref 6–20)
CALCIUM: 9 mg/dL (ref 8.9–10.3)
CO2: 24 mmol/L (ref 22–32)
CREATININE: 0.79 mg/dL (ref 0.44–1.00)
Chloride: 107 mmol/L (ref 101–111)
GFR calc Af Amer: >60 mL/min (ref >60)
GLUCOSE: 92 mg/dL (ref 65–99)
POTASSIUM: 3.4 mmol/L — AB (ref 3.5–5.1)
Sodium: 141 mmol/L (ref 135–145)
TOTAL PROTEIN: 6.6 g/dL (ref 6.5–8.1)

## 2015-09-21 LAB — CBC
HEMATOCRIT: 37 % (ref 36.0–46.0)
Hemoglobin: 12.1 g/dL (ref 12.0–15.0)
MCH: 31 pg (ref 26.0–34.0)
MCHC: 32.7 g/dL (ref 30.0–36.0)
MCV: 94.9 fL (ref 78.0–100.0)
PLATELETS: 272 10*3/uL (ref 150–400)
RBC: 3.9 MIL/uL (ref 3.87–5.11)
RDW: 12.9 % (ref 11.5–15.5)
WBC: 6.1 10*3/uL (ref 4.0–10.5)

## 2015-09-21 LAB — URINALYSIS, ROUTINE W REFLEX MICROSCOPIC
BILIRUBIN URINE: NEGATIVE
Glucose, UA: NEGATIVE mg/dL
Hgb urine dipstick: NEGATIVE
KETONES UR: NEGATIVE mg/dL
LEUKOCYTES UA: NEGATIVE
NITRITE: NEGATIVE
PH: 6.5 (ref 5.0–8.0)
PROTEIN: NEGATIVE mg/dL
Specific Gravity, Urine: 1.026 (ref 1.005–1.030)

## 2015-09-21 LAB — LIPASE, BLOOD: Lipase: 27 U/L (ref 11–51)

## 2015-09-21 LAB — I-STAT BETA HCG BLOOD, ED (MC, WL, AP ONLY): I-stat hCG, quantitative: <5 m[IU]/mL (ref <5)

## 2015-09-21 MED ORDER — RANITIDINE HCL 150 MG PO TABS: 150.0000 mg | ORAL_TABLET | Freq: Two times a day (BID) | ORAL | Status: DC

## 2015-09-21 MED ORDER — BISMUTH SUBSALICYLATE 262 MG/15ML PO SUSP: 30.0000 mL | Freq: Four times a day (QID) | ORAL | Status: DC | PRN

## 2015-09-21 NOTE — Discharge Instructions (Signed)

## 2015-09-21 NOTE — ED Notes (Signed)
Attempted to draw pts labs was unsuccessful, will see if phlebotomy can draw pts labs.

## 2015-09-21 NOTE — ED Provider Notes (Signed)
CSN: [id number]     Arrival date & time 09/21/15  1342 History   First MD Initiated Contact with Patient 09/21/15 1708     Chief Complaint  Patient presents with  . Abdominal Pain  . Emesis     (Consider location/radiation/quality/duration/timing/severity/associated sxs/prior Treatment) Patient is a 35 y.o. female presenting with abdominal pain and vomiting. The history is provided by the patient.  Abdominal Pain Pain location:  Epigastric and periumbilical Pain quality: burning   Pain radiates to:  Back and chest Pain severity:  Mild Onset quality:  Gradual Duration:  4 days Timing:  Constant Progression:  Worsening Chronicity:  New Context: not alcohol use   Relieved by:  Nothing Worsened by:  Nothing tried Ineffective treatments:  None tried Associated symptoms: diarrhea (ovver same period of time) and vomiting (2-3x yesterday and onc e today)   Associated symptoms: no hematochezia   Risk factors comment:  Smokes part of a blac andf mild per day Emesis Associated symptoms: abdominal pain and diarrhea (ovver same period of time)     Past Medical History  Diagnosis Date  . Epilepsy ([place])   . Asthma   . Stroke ([place])   . Seizures ([place])     last seizure 07-2015   Past Surgical History  Procedure Laterality Date  . Umbilical hernia repair  12/2014   Family History  Problem Relation Age of Onset  . Heart failure Mother   . Cancer Father   . Seizures Father    Social History  Substance Use Topics  . Smoking status: Current Every Day Smoker -- 0.50 packs/day    Types: Cigarettes  . Smokeless tobacco: None  . Alcohol Use: 0.6 oz/week    1 Cans of beer per week     Comment: occ   OB History    Gravida Para Term Preterm AB TAB SAB Ectopic Multiple Living   0 0 0 0 0 0 0 0       Review of Systems  Gastrointestinal: Positive for vomiting (2-3x yesterday and onc e today), abdominal pain and diarrhea (ovver same period of time). Negative for hematochezia.  All  other systems reviewed and are negative.     Allergies  Bee venom; Chocolate; Mosquito (diagnostic); Oregano; Dilantin; Garlic; Orange fruit; Penicillins; Tramadol; and Tape  Home Medications   Prior to Admission medications   Medication Sig Start Date End Date Taking? Authorizing Provider  albuterol (PROVENTIL HFA;VENTOLIN HFA) 108 (90 Base) MCG/ACT inhaler Inhale 2 puffs into the lungs every 4 (four) hours as needed for wheezing or shortness of breath. 09/12/15   Encarnacion Slates, NP  ARIPiprazole (ABILIFY) 10 MG tablet Take 1 tablet (10 mg total) by mouth daily. For mood control 09/12/15   Encarnacion Slates, NP  bismuth subsalicylate (PEPTO BISMOL) 262 MG/15ML suspension Take 30 mLs by mouth every 6 (six) hours as needed.    Historical Provider, MD  cetirizine (ZYRTEC) 10 MG tablet Take 1 tablet (10 mg total) by mouth daily as needed for allergies. 09/12/15   Encarnacion Slates, NP  Fluticasone Furoate-Vilanterol (BREO ELLIPTA) 200-25 MCG/INH AEPB Inhale 1 puff into the lungs daily after breakfast. For Asthma 09/12/15   Encarnacion Slates, NP  hydrOXYzine (ATARAX/VISTARIL) 25 MG tablet Take 1 tablet (25 mg total) by mouth every 6 (six) hours as needed for anxiety. 09/12/15   Encarnacion Slates, NP  levETIRAcetam (KEPPRA) 500 MG tablet Take 1 tablet (500 mg total) by mouth 2 (two) times daily. For seizures  09/12/15   Encarnacion Slates, NP  omeprazole (PRILOSEC) 20 MG capsule Take 1 capsule (20 mg total) by mouth daily. For acid reflux 09/12/15   Encarnacion Slates, NP  OXcarbazepine (TRILEPTAL) 150 MG tablet Take 1 tablet (150 mg total) by mouth 2 (two) times daily. For mood stabilization 09/12/15   Encarnacion Slates, NP  sertraline (ZOLOFT) 25 MG tablet Take 1 tablet (25 mg total) by mouth daily. For depression 09/12/15   Encarnacion Slates, NP  traZODone (DESYREL) 50 MG tablet Take 1 tablet (50 mg total) by mouth at bedtime. For sleep 09/12/15   Encarnacion Slates, NP   BP 113/92 mmHg  Pulse 79  Temp(Src) 97.7 F (36.5 C) (Oral)   Resp 17  Ht 5\' 4"  (1.626 m)  Wt 139 lb 9.6 oz (63.322 kg)  BMI 23.95 kg/m2  SpO2 100%  LMP 09/10/2015 Physical Exam  Constitutional: She is oriented to person, place, and time. She appears well-developed and well-nourished. No distress.  HENT:  Head: Normocephalic.  Eyes: Conjunctivae are normal.  Neck: Neck supple. No tracheal deviation present.  Cardiovascular: Normal rate, regular rhythm and normal heart sounds.   Pulmonary/Chest: Effort normal and breath sounds normal. No respiratory distress.  Abdominal: Soft. She exhibits no distension. There is no hepatosplenomegaly. There is tenderness (mild epigastric and left upper quadrant). There is no rebound, no guarding, no tenderness at McBurney's point and negative Murphy's sign. No hernia.  Neurological: She is alert and oriented to person, place, and time.  Skin: Skin is warm and dry.  Psychiatric: She has a normal mood and affect.  Vitals reviewed.   ED Course  Procedures (including critical care time) Labs Review Labs Reviewed  COMPREHENSIVE METABOLIC PANEL - Abnormal; Notable for the following:    Potassium 3.4 (*)    Alkaline Phosphatase 35 (*)    All other components within normal limits  URINALYSIS, ROUTINE W REFLEX MICROSCOPIC (NOT AT [facility]) - Abnormal; Notable for the following:    APPearance CLOUDY (*)    All other components within normal limits  LIPASE, BLOOD  CBC  I-STAT BETA HCG BLOOD, ED ([facility], [facility], [facility] ONLY)    Imaging Review No results found. I have personally reviewed and evaluated these images and lab results as part of my medical decision-making.   EKG Interpretation None      MDM   Final diagnoses:  Nausea vomiting and diarrhea  Abdominal pain, unspecified abdominal location    35 y.o. female presents with abdominal pain over the last 4 days intermittently associated with loose bowel movements and vomiting that started in the last 2 days. Reassuring abdominal exam. No significant metabolic or  hematologic abnormalities to explain patient's pain. No signs of appendicitis, cholecystitis or any other surgical emergency currently. Discussed supportive care with adding H2 blocker and Pepto-Bismol for symptomatic control. No further workup or imaging indicated today. Patient needs to establish primary care in the area and was provided contact information to do so.     Leo Grosser, MD 09/21/15 [phone]

## 2015-09-21 NOTE — ED Notes (Signed)
Patient brought back to room with family in tow; patient undressed, in gown, on monitor continuous pulse oximetry and blood pressure cuff; visitor at bedside

## 2015-09-21 NOTE — ED Provider Notes (Signed)
CSN: [id number]     Arrival date & time 09/21/15  1300 History   First MD Initiated Contact with Patient 09/21/15 1314     Chief Complaint  Patient presents with  . Abdominal Pain   (Consider location/radiation/quality/duration/timing/severity/associated sxs/prior Treatment) Patient is a 35 y.o. female presenting with abdominal pain. The history is provided by the patient.  Abdominal Pain Pain location:  Periumbilical Pain quality: burning and gnawing   Pain severity:  Moderate Onset quality:  Gradual Duration:  5 days Progression:  Waxing and waning Chronicity:  New Context: alcohol use and previous surgery   Relieved by:  None tried Worsened by:  Nothing tried Ineffective treatments:  None tried Associated symptoms: diarrhea, nausea and vomiting   Associated symptoms: no fever   Risk factors: alcohol abuse     Past Medical History  Diagnosis Date  . Epilepsy ([place])   . Asthma   . Stroke ([place])   . Seizures ([place])     last seizure 07-2015   Past Surgical History  Procedure Laterality Date  . Umbilical hernia repair  12/2014   Family History  Problem Relation Age of Onset  . Heart failure Mother   . Cancer Father   . Seizures Father    Social History  Substance Use Topics  . Smoking status: Current Every Day Smoker -- 0.50 packs/day    Types: Cigarettes  . Smokeless tobacco: None  . Alcohol Use: 0.6 oz/week    1 Cans of beer per week     Comment: occ   OB History    Gravida Para Term Preterm AB TAB SAB Ectopic Multiple Living   0 0 0 0 0 0 0 0       Review of Systems  Constitutional: Negative.  Negative for fever.  Gastrointestinal: Positive for nausea, vomiting, abdominal pain and diarrhea. Negative for blood in stool and abdominal distention.  All other systems reviewed and are negative.   Allergies  Bee venom; Chocolate; Mosquito (diagnostic); Oregano; Dilantin; Garlic; Orange fruit; Penicillins; Tramadol; and Tape  Home Medications   Prior to  Admission medications   Medication Sig Start Date End Date Taking? Authorizing Provider  bismuth subsalicylate (PEPTO BISMOL) 262 MG/15ML suspension Take 30 mLs by mouth every 6 (six) hours as needed.   Yes Historical Provider, MD  albuterol (PROVENTIL HFA;VENTOLIN HFA) 108 (90 Base) MCG/ACT inhaler Inhale 2 puffs into the lungs every 4 (four) hours as needed for wheezing or shortness of breath. 09/12/15   Encarnacion Slates, NP  ARIPiprazole (ABILIFY) 10 MG tablet Take 1 tablet (10 mg total) by mouth daily. For mood control 09/12/15   Encarnacion Slates, NP  cetirizine (ZYRTEC) 10 MG tablet Take 1 tablet (10 mg total) by mouth daily as needed for allergies. 09/12/15   Encarnacion Slates, NP  Fluticasone Furoate-Vilanterol (BREO ELLIPTA) 200-25 MCG/INH AEPB Inhale 1 puff into the lungs daily after breakfast. For Asthma 09/12/15   Encarnacion Slates, NP  hydrOXYzine (ATARAX/VISTARIL) 25 MG tablet Take 1 tablet (25 mg total) by mouth every 6 (six) hours as needed for anxiety. 09/12/15   Encarnacion Slates, NP  levETIRAcetam (KEPPRA) 500 MG tablet Take 1 tablet (500 mg total) by mouth 2 (two) times daily. For seizures 09/12/15   Encarnacion Slates, NP  omeprazole (PRILOSEC) 20 MG capsule Take 1 capsule (20 mg total) by mouth daily. For acid reflux 09/12/15   Encarnacion Slates, NP  OXcarbazepine (TRILEPTAL) 150 MG tablet Take 1 tablet (150 mg  total) by mouth 2 (two) times daily. For mood stabilization 09/12/15   Encarnacion Slates, NP  sertraline (ZOLOFT) 25 MG tablet Take 1 tablet (25 mg total) by mouth daily. For depression 09/12/15   Encarnacion Slates, NP  traZODone (DESYREL) 50 MG tablet Take 1 tablet (50 mg total) by mouth at bedtime. For sleep 09/12/15   Encarnacion Slates, NP   Meds Ordered and Administered this Visit  Medications - No data to display  BP 124/82 mmHg  Pulse 70  Temp(Src) 97.8 F (36.6 C) (Oral)  Resp 16  SpO2 100%  LMP 09/10/2015 No data found.   Physical Exam  Constitutional: She is oriented to person, place, and  time. She appears well-developed and well-nourished. She appears distressed.  Neck: Normal range of motion. Neck supple.  Cardiovascular: Normal heart sounds.   Pulmonary/Chest: Breath sounds normal.  Abdominal: Soft. Normal appearance and bowel sounds are normal. She exhibits no distension and no mass. There is generalized tenderness. There is no rigidity, no rebound, no guarding and no CVA tenderness.    Lymphadenopathy:    She has no cervical adenopathy.  Neurological: She is alert and oriented to person, place, and time.  Skin: Skin is warm and dry.  Nursing note and vitals reviewed.   ED Course  Procedures (including critical care time)  Labs Review Labs Reviewed - No data to display  Imaging Review No results found.   Visual Acuity Review  Right Eye Distance:   Left Eye Distance:   Bilateral Distance:    Right Eye Near:   Left Eye Near:    Bilateral Near:         MDM   1. Abdominal pain of unknown etiology    Sent for abd pain eval esp poss pancreatitis in view of etoh hx.    Billy Fischer, MD 09/21/15 [phone]

## 2015-09-21 NOTE — ED Notes (Signed)
Pt sent here from [facility] due to mid to left abd pain since Monday. Having n/v/d. No acute distress noted at triage.

## 2015-09-21 NOTE — ED Notes (Signed)
Reports abdominal pain, center abdomen and radiating through to back and around left side.  Reports some degree of pain and somewhat similar to pain experienced when patient had hernia surgery 12/2014.  Reports the pain has significantly increased since Monday 1/2.  Intermittent nausea and vomiting.  Last episode of vomiting was yesterday.  Intermittent loose stools, last loose stool was this morning.  Denies vaginal discharge.  Denies painful urination.  Rates pain 10/10 .  otc remedies not beneficial

## 2015-10-03 ENCOUNTER — Emergency Department (HOSPITAL_COMMUNITY)
Admission: EM | Admit: 2015-10-03 | Discharge: 2015-10-03 | Disposition: A | Discharge status: Home / Self Care | Payer: BLUE CROSS/BLUE SHIELD | Attending: Emergency Medicine | Admitting: Emergency Medicine

## 2015-10-03 ENCOUNTER — Encounter (HOSPITAL_COMMUNITY): Payer: Self-pay | Admitting: Emergency Medicine

## 2015-10-03 DIAGNOSIS — Z880 Allergy status to penicillin: Secondary | ICD-10-CM | POA: Insufficient documentation

## 2015-10-03 DIAGNOSIS — Z8673 Personal history of transient ischemic attack (TIA), and cerebral infarction without residual deficits: Secondary | ICD-10-CM | POA: Insufficient documentation

## 2015-10-03 DIAGNOSIS — F17210 Nicotine dependence, cigarettes, uncomplicated: Secondary | ICD-10-CM | POA: Insufficient documentation

## 2015-10-03 DIAGNOSIS — Z79899 Other long term (current) drug therapy: Secondary | ICD-10-CM | POA: Insufficient documentation

## 2015-10-03 DIAGNOSIS — G40909 Epilepsy, unspecified, not intractable, without status epilepticus: Secondary | ICD-10-CM | POA: Insufficient documentation

## 2015-10-03 DIAGNOSIS — R569 Unspecified convulsions: Secondary | ICD-10-CM

## 2015-10-03 DIAGNOSIS — Z7951 Long term (current) use of inhaled steroids: Secondary | ICD-10-CM | POA: Insufficient documentation

## 2015-10-03 DIAGNOSIS — J45909 Unspecified asthma, uncomplicated: Secondary | ICD-10-CM | POA: Insufficient documentation

## 2015-10-03 MED ORDER — OMEPRAZOLE 20 MG PO CPDR: 20.0000 mg | DELAYED_RELEASE_CAPSULE | Freq: Every day | ORAL | Status: DC

## 2015-10-03 MED ORDER — LEVETIRACETAM 500 MG PO TABS
500.0000 mg | ORAL_TABLET | Freq: Once | ORAL | Status: AC
  Administered 2015-10-03: 500 mg via ORAL
  Filled 2015-10-03: qty 1

## 2015-10-03 NOTE — Discharge Instructions (Signed)
Seizure, Adult A seizure is abnormal electrical activity in the brain. Seizures usually last from 30 seconds to 2 minutes. There are various types of seizures. Before a seizure, you may have a warning sensation (aura) that a seizure is about to occur. An aura may include the following symptoms:   Fear or anxiety.  Nausea.  Feeling like the room is spinning (vertigo).  Vision changes, such as seeing flashing lights or spots. Common symptoms during a seizure include:  A change in attention or behavior (altered mental status).  Convulsions with rhythmic jerking movements.  Drooling.  Rapid eye movements.  Grunting.  Loss of bladder and bowel control.  Bitter taste in the mouth.  Tongue biting. After a seizure, you may feel confused and sleepy. You may also have an injury resulting from convulsions during the seizure. HOME CARE INSTRUCTIONS   If you are given medicines, take them exactly as prescribed by your health care provider.  Keep all follow-up appointments as directed by your health care provider.  Do not swim or drive or engage in risky activity during which a seizure could cause further injury to you or others until your health care provider says it is OK.  Get adequate rest.  Teach friends and family what to do if you have a seizure. They should:  Lay you on the ground to prevent a fall.  Put a cushion under your head.  Loosen any tight clothing around your neck.  Turn you on your side. If vomiting occurs, this helps keep your airway clear.  Stay with you until you recover.  Know whether or not you need emergency care. SEEK IMMEDIATE MEDICAL CARE IF:  The seizure lasts longer than 5 minutes.  The seizure is severe or you do not wake up immediately after the seizure.  You have an altered mental status after the seizure.  You are having more frequent or worsening seizures. Someone should drive you to the emergency department or call local emergency  services (911 in [place] MAKE SURE YOU:  Understand these instructions.  Will watch your condition.  Will get help right away if you are not doing well or get worse.   This information is not intended to replace advice given to you by your health care provider. Make sure you discuss any questions you have with your health care provider.   Document Released: 08/29/2000 Document Revised: 09/22/2014 Document Reviewed: 04/13/2013 Elsevier Interactive Patient Education 2016 [employer] for Gastroesophageal Reflux Disease, Adult When you have gastroesophageal reflux disease (GERD), the foods you eat and your eating habits are very important. Choosing the right foods can help ease the discomfort of GERD. WHAT GENERAL GUIDELINES DO I NEED TO FOLLOW?  Choose fruits, vegetables, whole grains, low-fat dairy products, and low-fat meat, fish, and poultry.  Limit fats such as oils, salad dressings, butter, nuts, and avocado.  Keep a food diary to identify foods that cause symptoms.  Avoid foods that cause reflux. These may be different for different people.  Eat frequent small meals instead of three large meals each day.  Eat your meals slowly, in a relaxed setting.  Limit fried foods.  Cook foods using methods other than frying.  Avoid drinking alcohol.  Avoid drinking large amounts of liquids with your meals.  Avoid bending over or lying down until 2-3 hours after eating. WHAT FOODS ARE NOT RECOMMENDED? The following are some foods and drinks that may worsen your symptoms: Vegetables Tomatoes. Tomato juice. Tomato and spaghetti sauce.  Chili peppers. Onion and garlic. Horseradish. Fruits Oranges, grapefruit, and lemon (fruit and juice). Meats High-fat meats, fish, and poultry. This includes hot dogs, ribs, ham, sausage, salami, and bacon. Dairy Whole milk and chocolate milk. Sour cream. Cream. Butter. Ice cream. Cream cheese.  Beverages Coffee and tea, with or  without caffeine. Carbonated beverages or energy drinks. Condiments Hot sauce. Barbecue sauce.  Sweets/Desserts Chocolate and cocoa. Donuts. Peppermint and spearmint. Fats and Oils High-fat foods, including [place] fries and potato chips. Other Vinegar. Strong spices, such as black pepper, white pepper, red pepper, cayenne, curry powder, cloves, ginger, and chili powder. The items listed above may not be a complete list of foods and beverages to avoid. Contact your dietitian for more information.   This information is not intended to replace advice given to you by your health care provider. Make sure you discuss any questions you have with your health care provider.   Document Released: 09/01/2005 Document Revised: 09/22/2014 Document Reviewed: 07/06/2013 Elsevier Interactive Patient Education [employer].

## 2015-10-03 NOTE — ED Notes (Signed)
Per EMS, patient was at work.  Employees states patient had a possible seizure.  She was full body twitching. Patient does take Kepra.   Patient did not hit her head or fall.    BP:114/82 CBG:108 100% room air

## 2015-10-03 NOTE — ED Provider Notes (Signed)
CSN: [id number]     Arrival date & time 10/03/15  1719 History   First MD Initiated Contact with Patient 10/03/15 1738     Chief Complaint  Patient presents with  . Seizures   Tina [name] is a 35 y.o. female with a history of epilepsy who is on [place] who presents to the emergency department after possible seizure at work. Patient reports she does not remember the seizure. According to EMS bystanders report patient had a full body seizure lasting a few seconds and then resolved. She did not hit her head or fall. Patient currently denies any new complaints. She reports she missed her Keppra dose today. She reports she was busy and just forgot to take it. She reports she has plenty of this medicine at home. Patient denies headache, numbness, tingling, weakness, new abdominal pain, vomiting, nausea, diarrhea, double vision, neck pain, neck stiffness, rashes or back pain.  (Consider location/radiation/quality/duration/timing/severity/associated sxs/prior Treatment) HPI  Past Medical History  Diagnosis Date  . Epilepsy ([place])   . Asthma   . Stroke ([place])   . Seizures ([place])     last seizure 07-2015   Past Surgical History  Procedure Laterality Date  . Umbilical hernia repair  12/2014   Family History  Problem Relation Age of Onset  . Heart failure Mother   . Cancer Father   . Seizures Father    Social History  Substance Use Topics  . Smoking status: Current Every Day Smoker -- 0.50 packs/day    Types: Cigarettes  . Smokeless tobacco: None  . Alcohol Use: 0.6 oz/week    1 Cans of beer per week     Comment: occ   OB History    Gravida Para Term Preterm AB TAB SAB Ectopic Multiple Living   0 0 0 0 0 0 0 0       Review of Systems  Constitutional: Negative for fever and chills.  HENT: Negative for congestion and sore throat.   Eyes: Negative for visual disturbance.  Respiratory: Negative for cough, shortness of breath and wheezing.   Cardiovascular: Negative for chest  pain.  Gastrointestinal: Negative for nausea, vomiting, abdominal pain and diarrhea.  Genitourinary: Negative for dysuria.  Musculoskeletal: Negative for back pain and neck pain.  Skin: Negative for rash.  Neurological: Positive for seizures. Negative for dizziness, syncope, speech difficulty, weakness, light-headedness, numbness and headaches.      Allergies  Bee venom; Chocolate; Mosquito (diagnostic); Orange fruit; Oregano; Penicillins; Dilantin; Garlic; Tramadol; and Tape  Home Medications   Prior to Admission medications   Medication Sig Start Date End Date Taking? Authorizing Provider  albuterol (PROVENTIL HFA;VENTOLIN HFA) 108 (90 Base) MCG/ACT inhaler Inhale 2 puffs into the lungs every 4 (four) hours as needed for wheezing or shortness of breath. 09/12/15  Yes Encarnacion Slates, NP  ARIPiprazole (ABILIFY) 10 MG tablet Take 1 tablet (10 mg total) by mouth daily. For mood control 09/12/15  Yes Encarnacion Slates, NP  bismuth subsalicylate (PEPTO-BISMOL) 262 MG/15ML suspension Take 30 mLs by mouth every 6 (six) hours as needed. 09/21/15  Yes Leo Grosser, MD  cetirizine (ZYRTEC) 10 MG tablet Take 1 tablet (10 mg total) by mouth daily as needed for allergies. 09/12/15  Yes Encarnacion Slates, NP  Fluticasone Furoate-Vilanterol (BREO ELLIPTA) 200-25 MCG/INH AEPB Inhale 1 puff into the lungs daily after breakfast. For Asthma 09/12/15  Yes Encarnacion Slates, NP  hydrOXYzine (ATARAX/VISTARIL) 25 MG tablet Take 1 tablet (25 mg total) by mouth  every 6 (six) hours as needed for anxiety. 09/12/15  Yes Encarnacion Slates, NP  levETIRAcetam (KEPPRA) 500 MG tablet Take 1 tablet (500 mg total) by mouth 2 (two) times daily. For seizures 09/12/15  Yes Encarnacion Slates, NP  OXcarbazepine (TRILEPTAL) 150 MG tablet Take 1 tablet (150 mg total) by mouth 2 (two) times daily. For mood stabilization 09/12/15  Yes Encarnacion Slates, NP  ranitidine (ZANTAC) 150 MG tablet Take 1 tablet (150 mg total) by mouth 2 (two) times daily. 09/21/15  Yes  Leo Grosser, MD  sertraline (ZOLOFT) 25 MG tablet Take 1 tablet (25 mg total) by mouth daily. For depression Patient taking differently: Take 25 mg by mouth at bedtime. For depression 09/12/15  Yes Encarnacion Slates, NP  traZODone (DESYREL) 50 MG tablet Take 1 tablet (50 mg total) by mouth at bedtime. For sleep 09/12/15  Yes Encarnacion Slates, NP  omeprazole (PRILOSEC) 20 MG capsule Take 1 capsule (20 mg total) by mouth daily. 10/03/15   Waynetta Pean, PA-C   BP 130/64 mmHg  Pulse 78  Temp(Src) 98.6 F (37 C) (Oral)  Resp 18  SpO2 100%  LMP 09/10/2015 Physical Exam  Constitutional: She is oriented to person, place, and time. She appears well-developed and well-nourished. No distress.  Nontoxic appearing.  HENT:  Head: Normocephalic and atraumatic.  Right Ear: External ear normal.  Left Ear: External ear normal.  Mouth/Throat: Oropharynx is clear and moist.  No evidence of any head injury.  Eyes: Conjunctivae and EOM are normal. Pupils are equal, round, and reactive to light. Right eye exhibits no discharge. Left eye exhibits no discharge.  Neck: Normal range of motion. Neck supple. No JVD present. No tracheal deviation present.  Cardiovascular: Normal rate, regular rhythm, normal heart sounds and intact distal pulses.  Exam reveals no gallop and no friction rub.   No murmur heard. Pulmonary/Chest: Effort normal and breath sounds normal. No respiratory distress. She has no wheezes. She has no rales.  Lungs clear to auscultation bilaterally.  Abdominal: Soft. Bowel sounds are normal. There is no tenderness.  Musculoskeletal: Normal range of motion. She exhibits no edema or tenderness.  Patient has 5 out of 5 strength in her bilateral upper and lower extremities. She is able to ambulate with normal gait and without assistance.  Lymphadenopathy:    She has no cervical adenopathy.  Neurological: She is alert and oriented to person, place, and time. No cranial nerve deficit. Coordination normal.   The patient is alert and oriented 3. Cranial nerves are intact. Sensation intact her bilateral upper and lower extremity is. Speech is clear and coherent. She is able to ambulate with normal gait.  Skin: Skin is warm and dry. No rash noted. She is not diaphoretic. No erythema. No pallor.  Psychiatric: She has a normal mood and affect. Her behavior is normal.  Nursing note and vitals reviewed.   ED Course  Procedures (including critical care time) Labs Review Labs Reviewed - No data to display  Imaging Review No results found.    EKG Interpretation None      Filed Vitals:   10/03/15 1739  BP: 130/64  Pulse: 78  Temp: 98.6 F (37 C)  TempSrc: Oral  Resp: 18  SpO2: 100%     MDM   Meds given in ED:  Medications  levETIRAcetam (KEPPRA) tablet 500 mg (not administered)    New Prescriptions   OMEPRAZOLE (PRILOSEC) 20 MG CAPSULE    Take 1 capsule (20 mg  total) by mouth daily.    Final diagnoses:  Seizure [facility])   This is a 35 y.o. female with a history of epilepsy who is on [place] who presents to the emergency department with her brother after possible seizure at work. Patient reports she does not remember the seizure. According to EMS bystanders report patient had a full body seizure lasting a few seconds and then resolved. She did not hit her head or fall. Patient currently denies any new complaints. She reports she missed her Keppra dose today. She reports she was busy and just forgot to take it. She reports she has plenty of this medicine at home. On examination patient is afebrile nontoxic appearing. She has no focal neurological deficits. She does not appear to be postictal. She is able to ambulate with normal gait. She has no new complaints today. Will provide the patient with her Keppra tablet and discharge of this time. Patient is requesting prescription for omeprazole for possible gastric ulcer. Provided this for her and discussed dietary changes that can help with  GERD. I advised the patient to follow-up with their primary care provider and neurologist this week. I advised the patient to return to the emergency department with new or worsening symptoms or new concerns. The patient verbalized understanding and agreement with plan.    This patient was discussed with Dr. Johnney Killian who agrees with assessment and plan.     Waynetta Pean, PA-C 10/03/15 1830  Charlesetta [place], MD 10/03/15 [phone]

## 2015-10-25 ENCOUNTER — Ambulatory Visit (HOSPITAL_COMMUNITY): Payer: Self-pay | Admitting: Psychiatry

## 2015-10-29 ENCOUNTER — Ambulatory Visit (HOSPITAL_COMMUNITY): Payer: Self-pay | Admitting: Clinical

## 2015-10-31 ENCOUNTER — Encounter (HOSPITAL_COMMUNITY): Payer: Self-pay

## 2015-10-31 ENCOUNTER — Emergency Department (HOSPITAL_COMMUNITY): Payer: Self-pay

## 2015-10-31 ENCOUNTER — Observation Stay (HOSPITAL_COMMUNITY)
Admission: EM | Admit: 2015-10-31 | Discharge: 2015-11-01 | Disposition: A | Discharge status: Home / Self Care | Payer: Self-pay | Attending: Student in an Organized Health Care Education/Training Program | Admitting: Student in an Organized Health Care Education/Training Program

## 2015-10-31 DIAGNOSIS — G40901 Epilepsy, unspecified, not intractable, with status epilepticus: Secondary | ICD-10-CM

## 2015-10-31 DIAGNOSIS — R569 Unspecified convulsions: Secondary | ICD-10-CM

## 2015-10-31 DIAGNOSIS — F329 Major depressive disorder, single episode, unspecified: Secondary | ICD-10-CM

## 2015-10-31 DIAGNOSIS — Z79899 Other long term (current) drug therapy: Secondary | ICD-10-CM | POA: Insufficient documentation

## 2015-10-31 DIAGNOSIS — Z8673 Personal history of transient ischemic attack (TIA), and cerebral infarction without residual deficits: Secondary | ICD-10-CM | POA: Insufficient documentation

## 2015-10-31 DIAGNOSIS — IMO0002 Reserved for concepts with insufficient information to code with codable children: Secondary | ICD-10-CM | POA: Diagnosis present

## 2015-10-31 DIAGNOSIS — F1010 Alcohol abuse, uncomplicated: Secondary | ICD-10-CM | POA: Insufficient documentation

## 2015-10-31 DIAGNOSIS — Z9114 Patient's other noncompliance with medication regimen: Secondary | ICD-10-CM | POA: Insufficient documentation

## 2015-10-31 DIAGNOSIS — G40909 Epilepsy, unspecified, not intractable, without status epilepticus: Principal | ICD-10-CM

## 2015-10-31 DIAGNOSIS — F339 Major depressive disorder, recurrent, unspecified: Secondary | ICD-10-CM | POA: Diagnosis present

## 2015-10-31 DIAGNOSIS — Z8709 Personal history of other diseases of the respiratory system: Secondary | ICD-10-CM

## 2015-10-31 DIAGNOSIS — F1210 Cannabis abuse, uncomplicated: Secondary | ICD-10-CM | POA: Diagnosis present

## 2015-10-31 DIAGNOSIS — Y900 Blood alcohol level of less than 20 mg/100 ml: Secondary | ICD-10-CM | POA: Insufficient documentation

## 2015-10-31 DIAGNOSIS — F17210 Nicotine dependence, cigarettes, uncomplicated: Secondary | ICD-10-CM | POA: Insufficient documentation

## 2015-10-31 DIAGNOSIS — F1090 Alcohol use, unspecified, uncomplicated: Secondary | ICD-10-CM | POA: Diagnosis present

## 2015-10-31 DIAGNOSIS — F1290 Cannabis use, unspecified, uncomplicated: Secondary | ICD-10-CM | POA: Insufficient documentation

## 2015-10-31 DIAGNOSIS — J45909 Unspecified asthma, uncomplicated: Secondary | ICD-10-CM | POA: Insufficient documentation

## 2015-10-31 DIAGNOSIS — Z915 Personal history of self-harm: Secondary | ICD-10-CM | POA: Insufficient documentation

## 2015-10-31 DIAGNOSIS — Z880 Allergy status to penicillin: Secondary | ICD-10-CM | POA: Insufficient documentation

## 2015-10-31 LAB — I-STAT BETA HCG BLOOD, ED (MC, WL, AP ONLY)

## 2015-10-31 LAB — RAPID URINE DRUG SCREEN, HOSP PERFORMED
Amphetamines: NOT DETECTED
BARBITURATES: NOT DETECTED
Benzodiazepines: NOT DETECTED
Cocaine: NOT DETECTED
Opiates: NOT DETECTED
Tetrahydrocannabinol: POSITIVE — AB

## 2015-10-31 LAB — CBC WITH DIFFERENTIAL/PLATELET
BASOS PCT: 1 %
Basophils Absolute: 0 10*3/uL (ref 0.0–0.1)
EOS ABS: 0.2 10*3/uL (ref 0.0–0.7)
EOS PCT: 3 %
HCT: 38.7 % (ref 36.0–46.0)
Hemoglobin: 12.7 g/dL (ref 12.0–15.0)
LYMPHS ABS: 1.8 10*3/uL (ref 0.7–4.0)
Lymphocytes Relative: 32 %
MCH: 31.2 pg (ref 26.0–34.0)
MCHC: 32.8 g/dL (ref 30.0–36.0)
MCV: 95.1 fL (ref 78.0–100.0)
MONOS PCT: 9 %
Monocytes Absolute: 0.5 10*3/uL (ref 0.1–1.0)
Neutro Abs: 3.2 10*3/uL (ref 1.7–7.7)
Neutrophils Relative %: 55 %
PLATELETS: 222 10*3/uL (ref 150–400)
RBC: 4.07 MIL/uL (ref 3.87–5.11)
RDW: 12.9 % (ref 11.5–15.5)
WBC: 5.8 10*3/uL (ref 4.0–10.5)

## 2015-10-31 LAB — URINALYSIS, ROUTINE W REFLEX MICROSCOPIC
BILIRUBIN URINE: NEGATIVE
GLUCOSE, UA: NEGATIVE mg/dL
HGB URINE DIPSTICK: NEGATIVE
KETONES UR: NEGATIVE mg/dL
Leukocytes, UA: NEGATIVE
Nitrite: NEGATIVE
PH: 7.5 (ref 5.0–8.0)
Protein, ur: NEGATIVE mg/dL
Specific Gravity, Urine: 1.006 (ref 1.005–1.030)

## 2015-10-31 LAB — COMPREHENSIVE METABOLIC PANEL
ALK PHOS: 35 U/L — AB (ref 38–126)
ALT: 21 U/L (ref 14–54)
AST: 23 U/L (ref 15–41)
Albumin: 4 g/dL (ref 3.5–5.0)
Anion gap: 11 (ref 5–15)
BUN: 8 mg/dL (ref 6–20)
CALCIUM: 8.8 mg/dL — AB (ref 8.9–10.3)
CHLORIDE: 109 mmol/L (ref 101–111)
CO2: 20 mmol/L — ABNORMAL LOW (ref 22–32)
CREATININE: 0.87 mg/dL (ref 0.44–1.00)
Glucose, Bld: 86 mg/dL (ref 65–99)
Potassium: 4.2 mmol/L (ref 3.5–5.1)
Sodium: 140 mmol/L (ref 135–145)
Total Bilirubin: 0.8 mg/dL (ref 0.3–1.2)
Total Protein: 6.5 g/dL (ref 6.5–8.1)

## 2015-10-31 LAB — ETHANOL

## 2015-10-31 LAB — CK: CK TOTAL: 256 U/L — AB (ref 38–234)

## 2015-10-31 LAB — I-STAT CG4 LACTIC ACID, ED: LACTIC ACID, VENOUS: 1.66 mmol/L (ref 0.5–2.0)

## 2015-10-31 LAB — CBG MONITORING, ED: GLUCOSE-CAPILLARY: 77 mg/dL (ref 65–99)

## 2015-10-31 MED ORDER — LORAZEPAM 2 MG/ML IJ SOLN
1.0000 mg | Freq: Once | INTRAMUSCULAR | Status: AC
  Administered 2015-10-31: 2 mg via INTRAVENOUS

## 2015-10-31 MED ORDER — LORAZEPAM 1 MG PO TABS: 1.0000 mg | ORAL_TABLET | Freq: Four times a day (QID) | ORAL | Status: DC | PRN

## 2015-10-31 MED ORDER — SODIUM CHLORIDE 0.9 % IV SOLN: 1000.0000 mg | Freq: Once | INTRAVENOUS | Status: DC

## 2015-10-31 MED ORDER — SODIUM CHLORIDE 0.9 % IV SOLN
INTRAVENOUS | Status: DC
  Administered 2015-10-31: 11:00:00 via INTRAVENOUS

## 2015-10-31 MED ORDER — ACETAMINOPHEN 325 MG PO TABS: 650.0000 mg | ORAL_TABLET | Freq: Four times a day (QID) | ORAL | Status: DC | PRN

## 2015-10-31 MED ORDER — SODIUM CHLORIDE 0.9 % IV SOLN: INTRAVENOUS | Status: DC

## 2015-10-31 MED ORDER — SODIUM CHLORIDE 0.9 % IV SOLN
1000.0000 mg | Freq: Two times a day (BID) | INTRAVENOUS | Status: DC
  Administered 2015-10-31 – 2015-11-01 (×3): 1000 mg via INTRAVENOUS
  Filled 2015-10-31 (×3): qty 10

## 2015-10-31 MED ORDER — VITAMIN B-1 100 MG PO TABS
100.0000 mg | ORAL_TABLET | Freq: Every day | ORAL | Status: DC
  Administered 2015-10-31 – 2015-11-01 (×2): 100 mg via ORAL
  Filled 2015-10-31 (×2): qty 1

## 2015-10-31 MED ORDER — ENOXAPARIN SODIUM 40 MG/0.4ML ~~LOC~~ SOLN
40.0000 mg | SUBCUTANEOUS | Status: DC
  Administered 2015-10-31: 40 mg via SUBCUTANEOUS
  Filled 2015-10-31: qty 0.4

## 2015-10-31 MED ORDER — LORAZEPAM 2 MG/ML IJ SOLN: 1.0000 mg | Freq: Four times a day (QID) | INTRAMUSCULAR | Status: DC | PRN

## 2015-10-31 MED ORDER — THIAMINE HCL 100 MG/ML IJ SOLN
100.0000 mg | Freq: Every day | INTRAMUSCULAR | Status: DC
  Filled 2015-10-31: qty 2

## 2015-10-31 MED ORDER — ADULT MULTIVITAMIN W/MINERALS CH
1.0000 | ORAL_TABLET | Freq: Every day | ORAL | Status: DC
  Administered 2015-10-31 – 2015-11-01 (×2): 1 via ORAL
  Filled 2015-10-31 (×2): qty 1

## 2015-10-31 MED ORDER — FOLIC ACID 1 MG PO TABS
1.0000 mg | ORAL_TABLET | Freq: Every day | ORAL | Status: DC
  Administered 2015-10-31 – 2015-11-01 (×2): 1 mg via ORAL
  Filled 2015-10-31 (×2): qty 1

## 2015-10-31 MED ORDER — ACETAMINOPHEN 650 MG RE SUPP: 650.0000 mg | Freq: Four times a day (QID) | RECTAL | Status: DC | PRN

## 2015-10-31 MED ORDER — LORAZEPAM 2 MG/ML IJ SOLN
INTRAMUSCULAR | Status: AC
  Filled 2015-10-31: qty 1

## 2015-10-31 NOTE — Procedures (Signed)
ELECTROENCEPHALOGRAM REPORT  Date of Study: 10/31/2015  Patient's Name: Tina [name] MRN: [id number] Date of Birth: [birth year]  Referring Provider: Dr. Fenton Malling  Clinical History: This is a 35 year old woman with a history of seizures, with 6 seizures today, received Ativan prior to EEG.   Medications: levETIRAcetam (KEPPRA) 1,000 mg in sodium chloride 0.9 % 100 mL IVPB  Technical Summary: A multichannel digital EEG recording measured by the international 10-20 system with electrodes applied with paste and impedances below 5000 ohms performed in our laboratory with EKG monitoring in a predominantly drowsy and asleep patient.  Hyperventilation and photic stimulation were not performed.  The digital EEG was referentially recorded, reformatted, and digitally filtered in a variety of bipolar and referential montages for optimal display.    Description: The patient is predominantly drowsy and asleep during the recording.  During brief period of wakefulness, there is a symmetric, medium voltage 8 Hz posterior dominant rhythm that attenuates with eye opening.  The record is symmetric.  During drowsiness and sleep, there is an increase in theta slowing of the background.  Vertex waves and symmetric sleep spindles were seen.  Hyperventilation and photic stimulation were not performed.  There were no epileptiform discharges or electrographic seizures seen.    EKG lead showed occasional extrasystolic beats Impression: This predominantly drowsy and asleep EEG is normal.    Clinical Correlation: A normal EEG does not exclude a clinical diagnosis of epilepsy. There were no electrographic seizures in this study. Clinical correlation is advised.   Ellouise Newer, M.D.

## 2015-10-31 NOTE — Consult Note (Signed)
Requesting Physician: Dr.      Luiz Iron for consultation:  HPI:                                                                                                                                         Tina [name] is an 35 y.o. female patient with past medical history of epilepsy, asthma, CVA, MDD, alcohol abuse, THC misuse, previous SI/SA who presents to the hospital after seizures today. Patient was very drowsy when the inpatient team arrived in the ED to assess her. Per RN report, patient was brought to the ED by her fiance because of seizures. She states that the fiance reported that Tina [name] had 6 seizures in a row prior to her arrival in the ED. Once in the ED, the patient had a brief 10 to 15 second seizure that consisted of right sided lean and "clenching." EMR reports that upon intake, staff was told that she had not been taking her home Keppra for about 1 week. Attempted to contact patient's fiance to obtain more information, but the phone listed would not accept incoming calls. On review of EMR, patient was recently discharged (09/11/16) from [facility] for suicide attempt. There are multiple previous psychiatric hospitalizations. While in the ED, a head CT was obtained that showed no acute injury to the brain, but possible supraorbital soft tissue injury. She was given ativan 2 mg IV after having the witnessed seizure described above.    Past Medical History: Past Medical History  Diagnosis Date  . Epilepsy ([place])   . Asthma   . Stroke ([place])   . Seizures ([place])     last seizure 07-2015    Past Surgical History  Procedure Laterality Date  . Umbilical hernia repair  12/2014    Family History: Family History  Problem Relation Age of Onset  . Heart failure Mother   . Cancer Father   . Seizures Father     Social History:   reports that she has been smoking Cigarettes.  She has been smoking about 0.50 packs per day. She does not have any  smokeless tobacco history on file. She reports that she drinks about 0.6 oz of alcohol per week. She reports that she does not use illicit drugs.  Allergies:  Allergies  Allergen Reactions  . Bee Venom Anaphylaxis and Swelling    Swelling of whole body   . Chocolate Anaphylaxis  . Mosquito (Diagnostic) Anaphylaxis and Swelling    Swelling of whole body   . Orange Fruit [Citrus] Swelling and Other (See Comments)    Makes tongue bleed   . Oregano [Origanum Oil] Swelling    Swelling of roof of mouth   . Penicillins Hives, Shortness Of Breath and Other (See Comments)    Has patient had a PCN reaction causing immediate rash, facial/tongue/throat swelling, SOB or lightheadedness with hypotension: Yes Has patient had a  PCN reaction causing severe rash involving mucus membranes or skin necrosis: Yes Has patient had a PCN reaction that required hospitalization Yes Has patient had a PCN reaction occurring within the last 10 years: No If all of the above answers are "NO", then may proceed with Cephalosporin use.   . Dilantin [Phenytoin Sodium Extended] Itching and Swelling    Whole body   . Garlic Itching and Other (See Comments)    Makes eyes burn  . Tramadol Itching and Swelling    Whole body   . Tape Rash     Medications:                                                                                                                         Current facility-administered medications:  .  0.9 %  sodium chloride infusion, , Intravenous, Continuous, Akya Fiorello Fuller Mandril, MD, Last Rate: 100 mL/hr at 10/31/15 1112 .  levETIRAcetam (KEPPRA) 1,000 mg in sodium chloride 0.9 % 100 mL IVPB, 1,000 mg, Intravenous, Q12H, Octavius Shin Fuller Mandril, MD  Current outpatient prescriptions:  .  albuterol (PROVENTIL HFA;VENTOLIN HFA) 108 (90 Base) MCG/ACT inhaler, Inhale 2 puffs into the lungs every 4 (four) hours as needed for wheezing or shortness of breath., Disp: 1 Inhaler, Rfl: 1 .   ARIPiprazole (ABILIFY) 10 MG tablet, Take 1 tablet (10 mg total) by mouth daily. For mood control, Disp: 30 tablet, Rfl: 0 .  bismuth subsalicylate (PEPTO-BISMOL) 262 MG/15ML suspension, Take 30 mLs by mouth every 6 (six) hours as needed., Disp: 360 mL, Rfl: 0 .  cetirizine (ZYRTEC) 10 MG tablet, Take 1 tablet (10 mg total) by mouth daily as needed for allergies., Disp: , Rfl:  .  Fluticasone Furoate-Vilanterol (BREO ELLIPTA) 200-25 MCG/INH AEPB, Inhale 1 puff into the lungs daily after breakfast. For Asthma, Disp: , Rfl:  .  hydrOXYzine (ATARAX/VISTARIL) 25 MG tablet, Take 1 tablet (25 mg total) by mouth every 6 (six) hours as needed for anxiety., Disp: 45 tablet, Rfl: 0 .  levETIRAcetam (KEPPRA) 500 MG tablet, Take 1 tablet (500 mg total) by mouth 2 (two) times daily. For seizures, Disp: 60 tablet, Rfl: 0 .  omeprazole (PRILOSEC) 20 MG capsule, Take 1 capsule (20 mg total) by mouth daily., Disp: 30 capsule, Rfl: 0 .  OXcarbazepine (TRILEPTAL) 150 MG tablet, Take 1 tablet (150 mg total) by mouth 2 (two) times daily. For mood stabilization, Disp: 60 tablet, Rfl: 0 .  ranitidine (ZANTAC) 150 MG tablet, Take 1 tablet (150 mg total) by mouth 2 (two) times daily., Disp: 60 tablet, Rfl: 0 .  sertraline (ZOLOFT) 25 MG tablet, Take 1 tablet (25 mg total) by mouth daily. For depression (Patient taking differently: Take 25 mg by mouth at bedtime. For depression), Disp: 30 tablet, Rfl: 0 .  traZODone (DESYREL) 50 MG tablet, Take 1 tablet (50 mg total) by mouth at bedtime. For sleep, Disp: 30 tablet, Rfl: 0   ROS:  History    unobtainable from patient due to mental status  Neurologic Examination:                                                                                                      Blood pressure 102/78, pulse 63, temperature 98 F (36.7 C), temperature source  Oral, resp. rate 15, height 5\' 6"  (1.676 m), weight 63.504 kg (140 lb), SpO2 100 %.  Limited exam due to drowsiness, poor cooperation.  No gaze deviation, preserved brain stem reflexes. Withdraws to stimulus in all extremities, symm.  2+ DTR.    Lab Results: Basic Metabolic Panel: No results for input(s): NA, K, CL, CO2, GLUCOSE, BUN, CREATININE, CALCIUM, MG, PHOS in the last 168 hours.  Liver Function Tests: No results for input(s): AST, ALT, ALKPHOS, BILITOT, PROT, ALBUMIN in the last 168 hours. No results for input(s): LIPASE, AMYLASE in the last 168 hours. No results for input(s): AMMONIA in the last 168 hours.  CBC: No results for input(s): WBC, NEUTROABS, HGB, HCT, MCV, PLT in the last 168 hours.  Cardiac Enzymes: No results for input(s): CKTOTAL, CKMB, CKMBINDEX, TROPONINI in the last 168 hours.  Lipid Panel: No results for input(s): CHOL, TRIG, HDL, CHOLHDL, VLDL, LDLCALC in the last 168 hours.  CBG:  Recent Labs Lab 10/31/15 1053  GLUCAP 77    Microbiology: Results for orders placed or performed during the hospital encounter of 08/07/15  Wet prep, genital     Status: Abnormal   Collection Time: 08/07/15  3:15 AM  Result Value Ref Range Status   Yeast Wet Prep HPF POC NONE SEEN NONE SEEN Final   Trich, Wet Prep NONE SEEN NONE SEEN Final   Clue Cells Wet Prep HPF POC NONE SEEN NONE SEEN Final   WBC, Wet Prep HPF POC MODERATE (A) NONE SEEN Final    Comment: MODERATE BACTERIA SEEN   Sperm NONE SEEN  Final     Imaging: No results found.  Assessment and plan:   Tina [name] is an 35 y.o. female patient who presented following seizures. Poor medication compliance. Was on keppra 500 mg BID at home. Recommend to increase the dose to keppra 1 gm BID iv until her mental status returns to baseline and able to do PO. EEG didn't show any abnormal discharges or seizures today.  CT head showed no acute intracranial pathology. There was a questionable right  supraorbital soft tissue injury. No underlying fracture. Will be admitted for overnight monitoring.  Please call if any further questions.

## 2015-10-31 NOTE — Progress Notes (Signed)
Telemetry discontinued per Md order.

## 2015-10-31 NOTE — ED Notes (Signed)
Pt brought in by friends for witnessed seizure.  Unknown seizure time.  Pt alert to tactile stimuli and reports she has a hx.

## 2015-10-31 NOTE — ED Notes (Signed)
Pt fiance reports that pt has hx of epilepsy and has been out of her Keppra x1 week and has been taking her son's Keppra until she could get the prescription filled.  Fiance reports that she was on the way to [facility] with pt when she had "six seizures back to back."  Seizures described as full body.

## 2015-10-31 NOTE — H&P (Signed)
Date: 10/31/2015               Patient Name:  Tina [name] MRN: [id number]  DOB: Jan 30, 1981 Age / Sex: 35 y.o., female   PCP: No Pcp Per Patient         Medical Service: Internal Medicine Teaching Service         Attending Physician: Dr. Axel Filler, MD    First Contact: Lovina Reach, MS4 Pager: [phone]  Second Contact: Dr. Charlott Rakes Pager: [phone]       After Hours (After 5p/  First Contact Pager: [phone]  weekends / holidays): Second Contact Pager: [phone]   Chief Complaint: seizure  History of Present Illness: Mr. Alcantara is a 35 year old female with epilepsy, history of asthma, history of stroke, polysubstance abuse [alcohol, THC], depression complicated by prior suicide attempts warranting inpatient psychiatric hospitalizations who presented today with seizure. On her exam, patient was very drowsy, so history was collected from chart review as her fianc was unavailable by phone.  Per the ED, she was brought by her fianc who reported that she noted prodromal frontal headache which then escalated to 6 episodes of seizure-like activity in the car prior to arrival. Though she denied recent illness, she did acknowledge running out of her medication as it was not authorized by physician to the pharmacy. She was her fianc's son's Keppra though the dose was unclear.  She has a history of multiple psychiatric hospitalizations, including 2 in the past year for attempted suicide [07/31/15-08/03/15, 09/10/15-09/12/15]. Medications at her most recent discharge included Keppra 500 mg twice daily, Trileptal 150 mg twice daily, Zoloft 25 mg daily, Abilify 10 mg daily, Atarax 25mg  every 6 hours as needed for anxiety. She also presents emergency department on 10/03/15 with seizure-like activity witnessed by bystanders to resolve after several seconds. She also acknowledged non-adherence to Keppra as well.  In the emergency department, she did have a brief 10-15 second  episode of seizure-like activity which consisted of right-sided leaning and "clenching." She was given Ativan 2 mg IV with resolution of her seizure-like activity. Head CT was also notable for possible supraorbital soft tissue injury but no acute process intracranially.  Meds: Current Facility-Administered Medications  Medication Dose Route Frequency Provider Last Rate Last Dose  . 0.9 %  sodium chloride infusion   Intravenous Continuous Ram Fuller Mandril, MD 100 mL/hr at 10/31/15 1112    . levETIRAcetam (KEPPRA) 1,000 mg in sodium chloride 0.9 % 100 mL IVPB  1,000 mg Intravenous Q12H Ram Fuller Mandril, MD   Stopped at 10/31/15 1132   Current Outpatient Prescriptions  Medication Sig Dispense Refill  . albuterol (PROVENTIL HFA;VENTOLIN HFA) 108 (90 Base) MCG/ACT inhaler Inhale 2 puffs into the lungs every 4 (four) hours as needed for wheezing or shortness of breath. 1 Inhaler 1  . ARIPiprazole (ABILIFY) 10 MG tablet Take 1 tablet (10 mg total) by mouth daily. For mood control 30 tablet 0  . bismuth subsalicylate (PEPTO-BISMOL) 262 MG/15ML suspension Take 30 mLs by mouth every 6 (six) hours as needed. 360 mL 0  . cetirizine (ZYRTEC) 10 MG tablet Take 1 tablet (10 mg total) by mouth daily as needed for allergies.    . Fluticasone Furoate-Vilanterol (BREO ELLIPTA) 200-25 MCG/INH AEPB Inhale 1 puff into the lungs daily after breakfast. For Asthma    . hydrOXYzine (ATARAX/VISTARIL) 25 MG tablet Take 1 tablet (25 mg total) by mouth every 6 (six) hours as needed for anxiety. 45 tablet 0  .  levETIRAcetam (KEPPRA) 500 MG tablet Take 1 tablet (500 mg total) by mouth 2 (two) times daily. For seizures 60 tablet 0  . omeprazole (PRILOSEC) 20 MG capsule Take 1 capsule (20 mg total) by mouth daily. 30 capsule 0  . OXcarbazepine (TRILEPTAL) 150 MG tablet Take 1 tablet (150 mg total) by mouth 2 (two) times daily. For mood stabilization 60 tablet 0  . ranitidine (ZANTAC) 150 MG tablet Take 1  tablet (150 mg total) by mouth 2 (two) times daily. 60 tablet 0  . sertraline (ZOLOFT) 25 MG tablet Take 1 tablet (25 mg total) by mouth daily. For depression (Patient taking differently: Take 25 mg by mouth at bedtime. For depression) 30 tablet 0  . traZODone (DESYREL) 50 MG tablet Take 1 tablet (50 mg total) by mouth at bedtime. For sleep 30 tablet 0    Allergies: Allergies as of 10/31/2015 - Review Complete 10/31/2015  Allergen Reaction Noted  . Bee venom Anaphylaxis and Swelling 07/14/2015  . Chocolate Anaphylaxis 07/14/2015  . Mosquito (diagnostic) Anaphylaxis and Swelling 07/14/2015  . Orange fruit [citrus] Swelling and Other (See Comments) 07/14/2015  . Oregano [origanum oil] Swelling 07/14/2015  . Penicillins Hives, Shortness Of Breath, and Other (See Comments) 07/14/2015  . Dilantin [phenytoin sodium extended] Itching and Swelling 09/11/2015  . Garlic Itching and Other (See Comments) 07/14/2015  . Tramadol Itching and Swelling 09/11/2015  . Tape Rash 07/30/2015   Past Medical History  Diagnosis Date  . Epilepsy ([place])   . Asthma   . Stroke ([place])   . Seizures ([place])     last seizure 07-2015   Past Surgical History  Procedure Laterality Date  . Umbilical hernia repair  12/2014   Family History  Problem Relation Age of Onset  . Heart failure Mother   . Cancer Father   . Seizures Father    Social History   Social History  . Marital Status: Single    Spouse Name: N/A  . Number of Children: N/A  . Years of Education: N/A   Occupational History  . Not on file.   Social History Main Topics  . Smoking status: Current Every Day Smoker -- 0.50 packs/day    Types: Cigarettes  . Smokeless tobacco: Not on file  . Alcohol Use: 0.6 oz/week    1 Cans of beer per week     Comment: occ  . Drug Use: No  . Sexual Activity: Yes    Birth Control/ Protection: None   Other Topics Concern  . Not on file   Social History Narrative   ** Merged History Encounter **         Review of Systems: 10 point review systems was unable to be completed given the patient's mental status.  Physical Exam: Blood pressure 99/71, pulse 60, temperature 98 F (36.7 C), temperature source Oral, resp. rate 16, height 5\' 6"  (1.676 m), weight 140 lb (63.504 kg), SpO2 100 %.  General: Young African-American female, sleeping in bed HEENT: No scleral icterus, pupils with sluggish reaction to light Cardiac: Regular rate and rhythm, no rubs, murmurs or gallops Pulm: clear to auscultation bilaterally, no wheezes, rales, or rhonchi Abd: soft, nontender, nondistended, BS present Ext: warm and well perfused, 2+ pulses radial/dorsalis pedis bilaterally, no pedal edema Neuro: Opens eyes with sternal rub though answers questions with one-word answers before drifting back to sleep; moving all extremities freely   Lab results: Basic Metabolic Panel:  Recent Labs  10/31/15 1132  NA 140  K 4.2  CL 109  CO2 20*  GLUCOSE 86  BUN 8  CREATININE 0.87  CALCIUM 8.8*   Liver Function Tests:  Recent Labs  10/31/15 1132  AST 23  ALT 21  ALKPHOS 35*  BILITOT 0.8  PROT 6.5  ALBUMIN 4.0   CBC:  Recent Labs  10/31/15 1132  WBC 5.8  NEUTROABS 3.2  HGB 12.7  HCT 38.7  MCV 95.1  PLT 222   Cardiac Enzymes:  Recent Labs  10/31/15 1132  CKTOTAL 256*   CBG:  Recent Labs  10/31/15 1053  GLUCAP 77   Urine Drug Screen: Drugs of Abuse     Component Value Date/Time   LABOPIA NONE DETECTED 10/31/2015 1150   LABOPIA NEGATIVE 06/18/2007 1530   COCAINSCRNUR NONE DETECTED 10/31/2015 1150   COCAINSCRNUR NEGATIVE 06/18/2007 1530   LABBENZ NONE DETECTED 10/31/2015 1150   LABBENZ NEGATIVE 06/18/2007 1530   AMPHETMU NONE DETECTED 10/31/2015 1150   AMPHETMU NEGATIVE 06/18/2007 1530   THCU POSITIVE* 10/31/2015 1150   LABBARB NONE DETECTED 10/31/2015 1150    Alcohol Level:  Recent Labs  10/31/15 1132  [place] <5   Urinalysis:  Recent Labs  10/31/15 [place]  LABSPEC 1.006  PHURINE 7.5  GLUCOSEU NEGATIVE  HGBUR NEGATIVE  BILIRUBINUR NEGATIVE  KETONESUR NEGATIVE  PROTEINUR NEGATIVE  NITRITE NEGATIVE  LEUKOCYTESUR NEGATIVE   Misc. Labs: Serum Keppra level pending  Imaging results:  Ct Head Wo Contrast  10/31/2015  CLINICAL DATA:  35 year old female with multiple seizures today, out of epilepsy medication for 1 week. Initial encounter. EXAM: CT HEAD WITHOUT CONTRAST TECHNIQUE: Contiguous axial images were obtained from the base of the skull through the vertex without intravenous contrast. COMPARISON:  08/29/2015 and earlier. FINDINGS: Possible small right supraorbital scalp hematoma (series 7, image 24), otherwise no acute orbit or scalp soft tissue findings. Right frontal bone intact. Visualized paranasal sinuses and mastoids are clear. No acute osseous abnormality identified. Stable cerebral volume. No midline shift, ventriculomegaly, mass effect, evidence of mass lesion, intracranial hemorrhage or evidence of cortically based acute infarction. Gray-white matter differentiation is within normal limits throughout the brain. No suspicious intracranial vascular hyperdensity. IMPRESSION: 1. Stable and negative noncontrast CT appearance of the brain. 2. Questionable right supraorbital soft tissue injury. No underlying fracture. Electronically Signed   By: Genevie Ann M.D.   On: 10/31/2015 12:22    Other results: EKG: Reviewed and compared with 09/11/15 Normal rate, rhythm, axis.   Assessment & Plan by Problem:  Mr. Madar is a 35 year old female with epilepsy, history of asthma, history of stroke, polysubstance abuse [alcohol, THC], major depressive disorder complicated by prior suicide attempts warranting inpatient psychiatric hospitalizations hospitalized for seizure.  Seizure: Likely the setting of nonadherence which is consistent with prior hospitalizations though cannot exclude withdrawal from any substances. EKG without cardiac  abnormalities. No metabolic abnormalities noted on admission lab work. Head CT without any acute intracranial process. History of stroke noted on her chart is questionable as documentation could not be found in the chart corroborating this diagnosis. Certainly, a focal area of necrosis could serve as a nidus for epileptiform activity though focal neurologic deficit inconsistent with history. -Continue seizure and suicide precautions -Continue Keppra 1000 mg IV every 12 hours per neurology recommendations -Start CIWA protocol for possible alcohol withdrawal -Consider consulting case management for medication costs -Keep nothing by mouth -Neurology following, appreciate recommendations  Major depressive disorder: Multiple psychiatric hospitalizations as noted above. Assessment of patient's mood cannot be performed given her  sleepiness. Home medications include Abilify 10 mg daily, Trileptal 150 mg twice daily, Zoloft 25 mg at night, hydroxyzine 25 mg every 6 hours as needed for anxiety. -Holding home medications since she is postictal  Polysubstance abuse: UDS on admission notable for THC, consistent with prior. -Counsel patient against substance abuse  History of asthma: No PFTs on file. -Continue assessing  Dispo: Disposition is deferred at this time, awaiting improvement of current medical problems. Anticipated discharge in 0-1 days.  #FEN:  -Diet: Nothing by mouth  #DVT prophylaxis: Lovenox  #CODE STATUS: FULL CODE -Unable to confirm with patient on admission  The patient does not have a current PCP (No Pcp Per Patient) and does not need an [facility] hospital follow-up appointment after discharge.  The patient does not know have transportation limitations that hinder transportation to clinic appointments.  Signed: Riccardo Dubin, MD 10/31/2015, 1:20 PM

## 2015-10-31 NOTE — Progress Notes (Signed)
MD stated she will progress diet. Pt drowsiness has decreased, Pt alert and oriented. Continue suicide precautions per MD. Charge nurse notified, pt placed on 1:1 list.

## 2015-10-31 NOTE — H&P (Signed)
Date: 10/31/2015               Patient Name:  Tina [name] MRN: [id number]  DOB: 1980-12-14 Age / Sex: 35 y.o., female   PCP: No Pcp Per Patient              Medical Service: Internal Medicine Teaching Service              Attending Physician: Dr. Axel Filler, MD    First Contact: Lovina Reach, MS 4 Pager: [phone]  Second Contact: Dr. Charlott Rakes Pager: [phone]            After Hours (After 5p/  First Contact Pager: [phone]  weekends / holidays): Second Contact Pager: [phone]   Chief Complaint: Seizure  History of Present Illness:  [name] is a 35 year old female with past medical history of epilepsy, asthma, CVA, MDD, alcohol abuse, THC misuse, previous SI/SA  who presents to the hospital after seizures today.  Patient was very drowsy when the inpatient team arrived in the ED to assess her.    Per RN report, patient was brought to the ED by her fiance because of seizures.  She states that the fiance reported that Ms. Larios had 6 seizures in a row prior to her arrival in the ED.  Once in the ED, the patient had a brief 10 to 15 second seizure that consisted of right sided lean and "clenching."  EMR reports that upon intake, staff was told that she had not been taking her home Keppra for about 1 week. Attempted to contact patient's fiance to obtain more information, but the phone listed would not accept incoming calls.    On review of EMR, patient was recently discharged (09/11/16) from [facility] for suicide attempt.  There are multiple previous psychiatric hospitalizations.   While in the ED, a head CT was obtained that showed no acute injury to the brain, but possible supraorbital soft tissue injury. She was given ativan 2 mg IV after having the witnessed seizure described above.   Meds: Current Facility-Administered Medications  Medication Dose Route Frequency Provider Last Rate Last Dose  . 0.9 %  sodium chloride infusion    Intravenous Continuous Ram Fuller Mandril, MD 100 mL/hr at 10/31/15 1112    . levETIRAcetam (KEPPRA) 1,000 mg in sodium chloride 0.9 % 100 mL IVPB  1,000 mg Intravenous Q12H Ram Fuller Mandril, MD   Stopped at 10/31/15 1132   Current Outpatient Prescriptions  Medication Sig Dispense Refill  . albuterol (PROVENTIL HFA;VENTOLIN HFA) 108 (90 Base) MCG/ACT inhaler Inhale 2 puffs into the lungs every 4 (four) hours as needed for wheezing or shortness of breath. 1 Inhaler 1  . ARIPiprazole (ABILIFY) 10 MG tablet Take 1 tablet (10 mg total) by mouth daily. For mood control 30 tablet 0  . bismuth subsalicylate (PEPTO-BISMOL) 262 MG/15ML suspension Take 30 mLs by mouth every 6 (six) hours as needed. 360 mL 0  . cetirizine (ZYRTEC) 10 MG tablet Take 1 tablet (10 mg total) by mouth daily as needed for allergies.    . Fluticasone Furoate-Vilanterol (BREO ELLIPTA) 200-25 MCG/INH AEPB Inhale 1 puff into the lungs daily after breakfast. For Asthma    . hydrOXYzine (ATARAX/VISTARIL) 25 MG tablet Take 1 tablet (25 mg total) by mouth every 6 (six) hours as needed for anxiety. 45 tablet 0  . levETIRAcetam (KEPPRA) 500 MG tablet Take 1 tablet (500 mg total) by mouth 2 (two) times daily.  For seizures 60 tablet 0  . omeprazole (PRILOSEC) 20 MG capsule Take 1 capsule (20 mg total) by mouth daily. 30 capsule 0  . OXcarbazepine (TRILEPTAL) 150 MG tablet Take 1 tablet (150 mg total) by mouth 2 (two) times daily. For mood stabilization 60 tablet 0  . ranitidine (ZANTAC) 150 MG tablet Take 1 tablet (150 mg total) by mouth 2 (two) times daily. 60 tablet 0  . sertraline (ZOLOFT) 25 MG tablet Take 1 tablet (25 mg total) by mouth daily. For depression (Patient taking differently: Take 25 mg by mouth at bedtime. For depression) 30 tablet 0  . traZODone (DESYREL) 50 MG tablet Take 1 tablet (50 mg total) by mouth at bedtime. For sleep 30 tablet 0    Allergies: Allergies as of 10/31/2015 - Review Complete  10/31/2015  Allergen Reaction Noted  . Bee venom Anaphylaxis and Swelling 07/14/2015  . Chocolate Anaphylaxis 07/14/2015  . Mosquito (diagnostic) Anaphylaxis and Swelling 07/14/2015  . Orange fruit [citrus] Swelling and Other (See Comments) 07/14/2015  . Oregano [origanum oil] Swelling 07/14/2015  . Penicillins Hives, Shortness Of Breath, and Other (See Comments) 07/14/2015  . Dilantin [phenytoin sodium extended] Itching and Swelling 09/11/2015  . Garlic Itching and Other (See Comments) 07/14/2015  . Tramadol Itching and Swelling 09/11/2015  . Tape Rash 07/30/2015   Past Medical History  Diagnosis Date  . Epilepsy ([place])   . Asthma   . Stroke ([place])   . Seizures ([place])     last seizure 07-2015   Past Surgical History  Procedure Laterality Date  . Umbilical hernia repair  12/2014   Family History  Problem Relation Age of Onset  . Heart failure Mother   . Cancer Father   . Seizures Father    Social History   Social History  . Marital Status: Single    Spouse Name: N/A  . Number of Children: N/A  . Years of Education: N/A   Occupational History  . Not on file.   Social History Main Topics  . Smoking status: Current Every Day Smoker -- 0.50 packs/day    Types: Cigarettes  . Smokeless tobacco: Not on file  . Alcohol Use: 0.6 oz/week    1 Cans of beer per week     Comment: occ  . Drug Use: No  . Sexual Activity: Yes    Birth Control/ Protection: None   Other Topics Concern  . Not on file   Social History Narrative   ** Merged History Encounter **        Review of Systems: Review of systems not obtained due to patient factors.  Physical Exam: Blood pressure 99/71, pulse 60, temperature 98 F (36.7 C), temperature source Oral, resp. rate 16, height 5\' 6"  (1.676 m), weight 63.504 kg (140 lb), SpO2 100 %.  General Appearance: Appears older than stated age, lying in hospital bed, drowsy, not answering questions Head: Normocephalic, without obvious abnormality,  atraumatic Eyes: Pupils sluggishly reactive to light  Nose: Nares normal, septum midline, mucosa normal Throat: Lips, mucosa, and tongue normal; teeth and gums normal Back: Symmetric, no curvature, ROM normal Lungs: Clear to auscultation bilaterally, respirations unlabored Chest Wall: No tenderness or deformity Heart: Regular rate and rhythm, S1 and S2 normal, no murmur, rub  or gallop Abdomen: Soft, non-tender, bowel sounds active all four quadrants,no masses, no organomegaly Extremities: Extremities normal, atraumatic, no cyanosis or edema. Multiple tattoos Pulses: 2+ and symmetric in all extremities Neuro: Drowsy, follows commands (open your eyes), and will nod  yes or no   Lab results: Basic Metabolic Panel:  Recent Labs  10/31/15 1132  NA 140  K 4.2  CL 109  CO2 20*  GLUCOSE 86  BUN 8  CREATININE 0.87  CALCIUM 8.8*   Liver Function Tests:  Recent Labs  10/31/15 1132  AST 23  ALT 21  ALKPHOS 35*  BILITOT 0.8  PROT 6.5  ALBUMIN 4.0   CBC:  Recent Labs  10/31/15 1132  WBC 5.8  NEUTROABS 3.2  HGB 12.7  HCT 38.7  MCV 95.1  PLT 222   Cardiac Enzymes:  Recent Labs  10/31/15 1132  CKTOTAL 256*   CBG:  Recent Labs  10/31/15 1053  GLUCAP 77   Urine Drug Screen: Drugs of Abuse     Component Value Date/Time   LABOPIA NONE DETECTED 10/31/2015 1150   LABOPIA NEGATIVE 06/18/2007 1530   COCAINSCRNUR NONE DETECTED 10/31/2015 1150   COCAINSCRNUR NEGATIVE 06/18/2007 1530   LABBENZ NONE DETECTED 10/31/2015 1150   LABBENZ NEGATIVE 06/18/2007 1530   AMPHETMU NONE DETECTED 10/31/2015 1150   AMPHETMU NEGATIVE 06/18/2007 1530   THCU POSITIVE* 10/31/2015 1150   LABBARB NONE DETECTED 10/31/2015 1150    Alcohol Level:  Recent Labs  10/31/15 1132  ETH <5   Urinalysis:  Recent Labs  10/31/15 1149  COLORURINE YELLOW  LABSPEC 1.006  PHURINE 7.5  GLUCOSEU NEGATIVE  HGBUR NEGATIVE  BILIRUBINUR NEGATIVE  KETONESUR NEGATIVE  PROTEINUR NEGATIVE    NITRITE NEGATIVE  LEUKOCYTESUR NEGATIVE   Beta hCG: <5.0 Levetiracetam level: in process  EEG: The patient is predominantly drowsy and asleep during the recording. During brief period of wakefulness, there is a symmetric, medium voltage 8 Hz posterior dominant rhythm that attenuates with eye opening. The record is symmetric. During drowsiness and sleep, there is an increase in theta slowing of the background. Vertex waves and symmetric sleep spindles were seen. Hyperventilation and photic stimulation were not performed. There were no epileptiform discharges or electrographic seizures seen. Impression: This predominantly drowsy and asleep EEG is normal.   Imaging results:  Ct Head Wo Contrast  10/31/2015  CLINICAL DATA:  35 year old female with multiple seizures today, out of epilepsy medication for 1 week. Initial encounter. EXAM: CT HEAD WITHOUT CONTRAST TECHNIQUE: Contiguous axial images were obtained from the base of the skull through the vertex without intravenous contrast. COMPARISON:  08/29/2015 and earlier. FINDINGS: Possible small right supraorbital scalp hematoma (series 7, image 24), otherwise no acute orbit or scalp soft tissue findings. Right frontal bone intact. Visualized paranasal sinuses and mastoids are clear. No acute osseous abnormality identified. Stable cerebral volume. No midline shift, ventriculomegaly, mass effect, evidence of mass lesion, intracranial hemorrhage or evidence of cortically based acute infarction. Gray-white matter differentiation is within normal limits throughout the brain. No suspicious intracranial vascular hyperdensity. IMPRESSION: 1. Stable and negative noncontrast CT appearance of the brain. 2. Questionable right supraorbital soft tissue injury. No underlying fracture. Electronically Signed   By: Genevie Ann M.D.   On: 10/31/2015 12:22    Other results: EKG: NSR at 65 bpm. Anteroseptal infarct, age indeterminate  Assessment & Plan by Problem: Active  Problems:   Status epilepticus ([place])   Ms. Viscuso is a 35 year old female with past medical history of epilepsy, asthma, CVA, MDD, alcohol abuse, THC misuse, previous SI/SA who was brought to the hospital for seizure activity by her fiance who was found to have unremarkable non contrast CT hear, normal asleep EEG, no obvious signs of trauma.  #Seizure:  Patient has a history of epilepsy and reportedly has not been taking her Keppra for 1 week.  Per report, patient had 6 seizures prior to arrival to the ED today and 1 seizure while in the ED.  EEG at 1326 did not show continued seizure activity. Differential diagnosis includes medication non compliance, alcohol withdrawal,intracranial process (stroke, tumor, intracranial infection.).  It is also important to consider metabolic derangement and cardiac dysrhythmia.  Medication non compliance appears to be most likely cause of seizures as fiance reported that patient has not been taking her home Keppra for 1 week.  Alcohol withdrawal is also possible as patient has history of alcohol abuse.  Given normal non contrast CT, stroke, tumor and intracranial infection are less likely.  If seizure activity does not decrease after administration of Keppra, could consider further evaluation of intracranial process on MRI brain.  Head CT did mention possible right supraorbital soft tissue injury, however no trauma was noted on exam. CMP was unremarkable, making metabolic derangement unlikely.  EKG did not show cardiac dysrhythmia, but will plan to ask about palpitations or chest pain preceding seizure activity once patient is able to participate in interview.   - Seizure precautions  - Start levetiracetam 1000 mg IV Q12H - Start CIWA protocol  - NPO in setting of recent seizure - Neurology consulted  #Major Depressive Disorder/History of SI and SA: History of multiple psychiatric hospitalizations. Unable to evaluate patient's mood at this time, but will evaluate when  patient is more alert and able to participate in interview. Home medications include aripiprazole 10 mg PO daily, hydroxazine 25 mg Q6H prn anxiety, oxcarbazepine 150 mg PO BID, sertraline 25 mg PO nightly.  - Holding home medications in the setting of somnolence - Placed on suicide precautions given recent admission for suicide attempt and patient unable to discuss events leading up to seizures today  #THC Misuse: Positive THC on UDS upon admission. Plan to obtain [facility] history once patient is more able to participate in interview.   #History of Asthma: No wheezing on exam.  Will continue to monitor during hospital stay.    FENGI/Ppx:  Fluids: NS @ 100 ml/hr  Electrolytes: no need to replace  Nutrition: NPO  GI: None. Takes omeprazole 20 mg PO daily at home   Ppx: Lovenox  Dispo: Admit to Med Surg   This is a Careers information officer Note.  The care of the patient was discussed with Dr. Charlott Rakes  and the assessment and plan was formulated with their assistance.  Please see their note for official documentation of the patient encounter.   Signed: Lovina Reach, Med Student 10/31/2015, 2:08 PM

## 2015-10-31 NOTE — ED Provider Notes (Signed)
CSN: [id number]     Arrival date & time 10/31/15  1034 History   First MD Initiated Contact with Patient 10/31/15 1042     Chief Complaint  Patient presents with  . Seizures     (Consider location/radiation/quality/duration/timing/severity/associated sxs/prior Treatment) HPI Patient has known, epileptic seizure disorder. She was with her fianc in the car. Her fianc reports that she knew that she was getting ready to have a seizure. The patient reported some frontal headache which was typical prodrome. She went on to have 6 seizures back to back. She denies recent illness. The patient however ran out of her [place] because it was supposed to be authorized by a physician through the pharmacy and that reportedly did not get done. For the past week the patient has been taking her fianc's son's Keppra. The fianc is not sure of the concentration. She reports that the patient has been taking 10 mL daily. Past Medical History  Diagnosis Date  . Epilepsy ([place])   . Asthma   . Stroke ([place])   . Seizures ([place])     last seizure 07-2015   Past Surgical History  Procedure Laterality Date  . Umbilical hernia repair  12/2014   Family History  Problem Relation Age of Onset  . Heart failure Mother   . Cancer Father   . Seizures Father    Social History  Substance Use Topics  . Smoking status: Current Every Day Smoker -- 0.50 packs/day    Types: Cigarettes  . Smokeless tobacco: None  . Alcohol Use: 0.6 oz/week    1 Cans of beer per week     Comment: occ   OB History    Gravida Para Term Preterm AB TAB SAB Ectopic Multiple Living   0 0 0 0 0 0 0 0       Review of Systems 10 Systems reviewed and are negative for acute change except as noted in the HPI.    Allergies  Bee venom; Chocolate; Mosquito (diagnostic); Orange fruit; Oregano; Penicillins; Dilantin; Garlic; Tramadol; and Tape  Home Medications   Prior to Admission medications   Medication Sig Start Date End Date Taking?  Authorizing Provider  albuterol (PROVENTIL HFA;VENTOLIN HFA) 108 (90 Base) MCG/ACT inhaler Inhale 2 puffs into the lungs every 4 (four) hours as needed for wheezing or shortness of breath. 09/12/15  Yes Encarnacion Slates, NP  ARIPiprazole (ABILIFY) 10 MG tablet Take 1 tablet (10 mg total) by mouth daily. For mood control 09/12/15  Yes Encarnacion Slates, NP  bismuth subsalicylate (PEPTO-BISMOL) 262 MG/15ML suspension Take 30 mLs by mouth every 6 (six) hours as needed. 09/21/15  Yes Leo Grosser, MD  cetirizine (ZYRTEC) 10 MG tablet Take 1 tablet (10 mg total) by mouth daily as needed for allergies. 09/12/15  Yes Encarnacion Slates, NP  Fluticasone Furoate-Vilanterol (BREO ELLIPTA) 200-25 MCG/INH AEPB Inhale 1 puff into the lungs daily after breakfast. For Asthma 09/12/15  Yes Encarnacion Slates, NP  hydrOXYzine (ATARAX/VISTARIL) 25 MG tablet Take 1 tablet (25 mg total) by mouth every 6 (six) hours as needed for anxiety. 09/12/15  Yes Encarnacion Slates, NP  levETIRAcetam (KEPPRA) 500 MG tablet Take 1 tablet (500 mg total) by mouth 2 (two) times daily. For seizures 09/12/15  Yes Encarnacion Slates, NP  omeprazole (PRILOSEC) 20 MG capsule Take 1 capsule (20 mg total) by mouth daily. 10/03/15  Yes Waynetta Pean, PA-C  OXcarbazepine (TRILEPTAL) 150 MG tablet Take 1 tablet (150 mg total) by mouth  2 (two) times daily. For mood stabilization 09/12/15  Yes Encarnacion Slates, NP  ranitidine (ZANTAC) 150 MG tablet Take 1 tablet (150 mg total) by mouth 2 (two) times daily. 09/21/15  Yes Leo Grosser, MD  sertraline (ZOLOFT) 25 MG tablet Take 1 tablet (25 mg total) by mouth daily. For depression Patient taking differently: Take 25 mg by mouth at bedtime. For depression 09/12/15  Yes Encarnacion Slates, NP  traZODone (DESYREL) 50 MG tablet Take 1 tablet (50 mg total) by mouth at bedtime. For sleep 09/12/15  Yes Encarnacion Slates, NP   BP 99/71 mmHg  Pulse 60  Temp(Src) 98 F (36.7 C) (Oral)  Resp 16  Ht 5\' 6"  (1.676 m)  Wt 140 lb (63.504 kg)  BMI 22.61  kg/m2  SpO2 100% Physical Exam  Constitutional:  Patient is somnolent but awakens to tactile stimulus. She does answer questions as to person and birthdate appropriately.  HENT:  Head: Normocephalic and atraumatic.  Right Ear: External ear normal.  Left Ear: External ear normal.  Mouth/Throat: Oropharynx is clear and moist.  Eyes: EOM are normal. Pupils are equal, round, and reactive to light.  Cardiovascular: Normal rate, regular rhythm, normal heart sounds and intact distal pulses.   Pulmonary/Chest: Effort normal and breath sounds normal.  Abdominal: Soft. Bowel sounds are normal. She exhibits no distension. There is no tenderness.  Musculoskeletal: She exhibits no edema.  No extremity deformities. No peripheral edema.  Neurological:  Patient is somnolent but awakens to light stimulus. He follows commands to grip symmetrically in both hands. She also follows commands for extension of both feet. She is drowsy but answers her name and birthdate correctly.  Skin: Skin is warm and dry.    ED Course  Procedures (including critical care time) CRITICAL CARE Performed by: Charlesetta Shanks   Total critical care time: 30 minutes  Critical care time was exclusive of separately billable procedures and treating other patients.  Critical care was necessary to treat or prevent imminent or life-threatening deterioration.  Critical care was time spent personally by me on the following activities: development of treatment plan with patient and/or surrogate as well as nursing, discussions with consultants, evaluation of patient's response to treatment, examination of patient, obtaining history from patient or surrogate, ordering and performing treatments and interventions, ordering and review of laboratory studies, ordering and review of radiographic studies, pulse oximetry and re-evaluation of patient's condition. Labs Review Labs Reviewed  COMPREHENSIVE METABOLIC PANEL - Abnormal; Notable for  the following:    CO2 20 (*)    Calcium 8.8 (*)    Alkaline Phosphatase 35 (*)    All other components within normal limits  URINE RAPID DRUG SCREEN, HOSP PERFORMED - Abnormal; Notable for the following:    Tetrahydrocannabinol POSITIVE (*)    All other components within normal limits  CK - Abnormal; Notable for the following:    Total CK 256 (*)    All other components within normal limits  CBC WITH DIFFERENTIAL/PLATELET  URINALYSIS, ROUTINE W REFLEX MICROSCOPIC (NOT AT [facility])  ETHANOL  LEVETIRACETAM LEVEL  CBG MONITORING, ED  I-STAT BETA HCG BLOOD, ED ([facility], [facility], [facility] ONLY)  I-STAT CG4 LACTIC ACID, ED    Imaging Review Ct Head Wo Contrast  10/31/2015  CLINICAL DATA:  35 year old female with multiple seizures today, out of epilepsy medication for 1 week. Initial encounter. EXAM: CT HEAD WITHOUT CONTRAST TECHNIQUE: Contiguous axial images were obtained from the base of the skull through the vertex without intravenous  contrast. COMPARISON:  08/29/2015 and earlier. FINDINGS: Possible small right supraorbital scalp hematoma (series 7, image 24), otherwise no acute orbit or scalp soft tissue findings. Right frontal bone intact. Visualized paranasal sinuses and mastoids are clear. No acute osseous abnormality identified. Stable cerebral volume. No midline shift, ventriculomegaly, mass effect, evidence of mass lesion, intracranial hemorrhage or evidence of cortically based acute infarction. Gray-white matter differentiation is within normal limits throughout the brain. No suspicious intracranial vascular hyperdensity. IMPRESSION: 1. Stable and negative noncontrast CT appearance of the brain. 2. Questionable right supraorbital soft tissue injury. No underlying fracture. Electronically Signed   By: Genevie Ann M.D.   On: 10/31/2015 12:22   I have personally reviewed and evaluated these images and lab results as part of my medical decision-making.   EKG Interpretation   Date/Time:  Wednesday October 31 2015 10:44:56 EST Ventricular Rate:  65 PR Interval:    QRS Duration: 86 QT Interval:  391 QTC Calculation: 406 R Axis:   76 Text Interpretation:  Anteroseptal infarct, age indeterminate Confirmed by  Johnney Killian, MD, Jeannie Done [phone]) on 10/31/2015 1:18:19 PM     Consult: Patient's case was reviewed Dr. Silverio Decamp. He has seen the patient in the emergency department.  Consult: Internal medicine teaching service. Admit to Dr. Lalla Brothers MDM   Final diagnoses:  Status epilepticus [facility])   Patient has known epilepsy history. She has had change in frequency of seizure with 6 sequential seizure today. Review of systems does not indicate acute infectious illness. Patient has been started on IV Keppra and EEG monitoring.    Charlesetta Shanks, MD 10/31/15 1320

## 2015-10-31 NOTE — Progress Notes (Signed)
EEG completed; results pending.    

## 2015-10-31 NOTE — Progress Notes (Signed)
Pt arrived at 17:40pm alert with no noted distress. Neuro intact, no complaints of pain or discomfort. Telemetry applied.  Pt oriented to room. Safety measures in place. Family at bedside. Call bell within reach. Will continue to monitor.

## 2015-10-31 NOTE — ED Notes (Signed)
CBG- 77 

## 2015-11-01 DIAGNOSIS — F339 Major depressive disorder, recurrent, unspecified: Secondary | ICD-10-CM

## 2015-11-01 DIAGNOSIS — F1010 Alcohol abuse, uncomplicated: Secondary | ICD-10-CM

## 2015-11-01 DIAGNOSIS — F1210 Cannabis abuse, uncomplicated: Secondary | ICD-10-CM

## 2015-11-01 MED ORDER — OMEPRAZOLE 20 MG PO CPDR: 20.0000 mg | DELAYED_RELEASE_CAPSULE | Freq: Every day | ORAL | Status: DC

## 2015-11-01 MED ORDER — LEVETIRACETAM 500 MG PO TABS: 1000.0000 mg | ORAL_TABLET | Freq: Two times a day (BID) | ORAL | Status: DC

## 2015-11-01 MED ORDER — LEVETIRACETAM 1000 MG PO TABS: 1000.0000 mg | ORAL_TABLET | Freq: Two times a day (BID) | ORAL | Status: DC

## 2015-11-01 MED ORDER — SERTRALINE HCL 50 MG PO TABS: 25.0000 mg | ORAL_TABLET | Freq: Every day | ORAL | Status: DC

## 2015-11-01 MED ORDER — SERTRALINE HCL 25 MG PO TABS: 25.0000 mg | ORAL_TABLET | Freq: Every day | ORAL | Status: DC

## 2015-11-01 NOTE — Discharge Instructions (Signed)
You were hospitalized at [facility] for seizure.  You were restarted on a higher dose of Keppra during your stay.  Your new dose is 1000mg  two times per day.   --It is important to take your Keppra every day to avoid seizures in the future.   --It is also important that you stop drinking alcohol and using marijuana for the next 6 months as these can both make a seizure more likely to happen.   --Please follow-up in our clinic next week. --Please follow-up with Neurology in 2-4 weeks for follow-up of your Epilepsy and further medication management --Please follow-up with your Psychiatrist within the next 1-2 weeks for further medication recommendations.  For now, please continue Zoloft. -- Do not drive, operate heavy machinery, perform activities from heights, swim or participate in water activites for the at least 6 months or until your medical provider/neurologist says you can. ______________________________________________ Epilepsy People with epilepsy have times when they shake and jerk uncontrollably (seizures). This happens when there is a sudden change in brain function. Epilepsy may have many possible causes. Anything that disturbs the normal pattern of brain cell activity can lead to seizures. HOME CARE   Follow your doctor's instructions about driving and safety during normal activities.  Get enough sleep.  Only take medicine as told by your doctor.  Avoid things that you know can cause you to have seizures (triggers).  Write down when your seizures happen and what you remember about each seizure. Write down anything you think may have caused the seizure to happen.  Tell the people you live and work with that you have seizures. Make sure they know how to help you. They should:  Cushion your head and body.  Turn you on your side.  Not restrain you.  Not place anything inside your mouth.  Call for local emergency medical help if there is any question about what has  happened.  Keep all follow-up visits with your doctor. This is very important. GET HELP IF:  You get an infection or start to feel sick. You may have more seizures when you are sick.  You are having seizures more often.  Your seizure pattern is changing. GET HELP RIGHT AWAY IF:   A seizure does not stop after a few seconds or minutes.  A seizure causes you to have trouble breathing.  A seizure gives you a very bad headache.  A seizure makes you unable to speak or use a part of your body.   This information is not intended to replace advice given to you by your health care provider. Make sure you discuss any questions you have with your health care provider.   Document Released: 06/29/2009 Document Revised: 06/22/2013 Document Reviewed: 04/13/2013 Elsevier Interactive Patient Education [employer].

## 2015-11-01 NOTE — Care Management Note (Signed)
Case Management Note  Patient Details  Name: Tina [name] MRN: [id number] Date of Birth: [birth year]  Subjective/Objective:                    Action/Plan: Patient being discharged home today with self care. Patient provided with [place] letter to assist her in obtaining her medications, with a list of pharmacies that accept the [facility] letter. Bedside RN updated.   Expected Discharge Date:                  Expected Discharge Plan:  Home/Self Care  In-House Referral:     Discharge planning Services  CM Consult  Post Acute Care Choice:    Choice offered to:     DME Arranged:    DME Agency:     HH Arranged:    HH Agency:     Status of Service:  In process, will continue to follow  Medicare Important Message Given:    Date Medicare IM Given:    Medicare IM give by:    Date Additional Medicare IM Given:    Additional Medicare Important Message give by:     If discussed at [place] of Stay Meetings, dates discussed:    Additional Comments:  Pollie Friar, RN 11/01/2015, 2:30 PM

## 2015-11-01 NOTE — Progress Notes (Signed)
Subjective: No further seizures. On Keppra 1 gram BID.  Working with [employer] to get orange card so she can afford Keppra.  Objective: Current vital signs: BP 93/54 mmHg  Pulse 68  Temp(Src) 98 F (36.7 C) (Oral)  Resp 18  Ht 5\' 6"  (1.676 m)  Wt 63.504 kg (140 lb)  BMI 22.61 kg/m2  SpO2 100% Vital signs in last 24 hours: Temp:  [98 F (36.7 C)-98.8 F (37.1 C)] 98 F (36.7 C) (02/16 0646) Pulse Rate:  [60-78] 68 (02/16 0646) Resp:  [13-21] 18 (02/16 0646) BP: (93-114)/(53-78) 93/54 mmHg (02/16 0646) SpO2:  [98 %-100 %] 100 % (02/16 0646) Weight:  [63.504 kg (140 lb)] 63.504 kg (140 lb) (02/15 1047)  Intake/Output from previous day: 02/15 0701 - 02/16 0700 In: -  Out: 2 [Urine:2] Intake/Output this shift:   Nutritional status: Diet regular Room service appropriate?: Yes; Fluid consistency:: Thin  Neurologic Exam: General: NAD Mental Status: Alert, oriented, thought content appropriate.  Speech fluent without evidence of aphasia.  Able to follow 3 step commands without difficulty. Cranial Nerves: II:  Visual fields grossly normal, pupils equal, round, reactive to light and accommodation III,IV, VI: ptosis not present, extra-ocular motions intact bilaterally V,VII: smile symmetric, facial light touch sensation normal bilaterally VIII: hearing normal bilaterally IX,X: uvula rises symmetrically XI: bilateral shoulder shrug XII: midline tongue extension without atrophy or fasciculations  Motor: Right : Upper extremity   5/5    Left:     Upper extremity   5/5  Lower extremity   5/5     Lower extremity   5/5 Tone and bulk:normal tone throughout; no atrophy noted Sensory: Pinprick and light touch intact throughout, bilaterally Deep Tendon Reflexes:  Right: Upper Extremity   Left: Upper extremity   biceps (C-5 to C-6) 2/4   biceps (C-5 to C-6) 2/4 tricep (C7) 2/4    triceps (C7) 2/4 Brachioradialis (C6) 2/4  Brachioradialis (C6) 2/4  Lower Extremity Lower Extremity   quadriceps (L-2 to L-4) 2/4   quadriceps (L-2 to L-4) 2/4 Achilles (S1) 2/4   Achilles (S1) 2/4  Plantars: Right: downgoing   Left: downgoing Cerebellar: normal finger-to-nose,  normal heel-to-shin test     Lab Results: Basic Metabolic Panel:  Recent Labs Lab 10/31/15 1132  NA 140  K 4.2  CL 109  CO2 20*  GLUCOSE 86  BUN 8  CREATININE 0.87  CALCIUM 8.8*    Liver Function Tests:  Recent Labs Lab 10/31/15 1132  AST 23  ALT 21  ALKPHOS 35*  BILITOT 0.8  PROT 6.5  ALBUMIN 4.0   No results for input(s): LIPASE, AMYLASE in the last 168 hours. No results for input(s): AMMONIA in the last 168 hours.  CBC:  Recent Labs Lab 10/31/15 1132  WBC 5.8  NEUTROABS 3.2  HGB 12.7  HCT 38.7  MCV 95.1  PLT 222    Cardiac Enzymes:  Recent Labs Lab 10/31/15 1132  CKTOTAL 256*    Lipid Panel: No results for input(s): CHOL, TRIG, HDL, CHOLHDL, VLDL, LDLCALC in the last 168 hours.  CBG:  Recent Labs Lab 10/31/15 1053  GLUCAP 77    Microbiology: Results for orders placed or performed during the hospital encounter of 08/07/15  Wet prep, genital     Status: Abnormal   Collection Time: 08/07/15  3:15 AM  Result Value Ref Range Status   Yeast Wet Prep HPF POC NONE SEEN NONE SEEN Final   Trich, Wet Prep NONE SEEN NONE SEEN Final  Clue Cells Wet Prep HPF POC NONE SEEN NONE SEEN Final   WBC, Wet Prep HPF POC MODERATE (A) NONE SEEN Final    Comment: MODERATE BACTERIA SEEN   Sperm NONE SEEN  Final    Coagulation Studies: No results for input(s): LABPROT, INR in the last 72 hours.  Imaging: Ct Head Wo Contrast  10/31/2015  CLINICAL DATA:  35 year old female with multiple seizures today, out of epilepsy medication for 1 week. Initial encounter. EXAM: CT HEAD WITHOUT CONTRAST TECHNIQUE: Contiguous axial images were obtained from the base of the skull through the vertex without intravenous contrast. COMPARISON:  08/29/2015 and earlier. FINDINGS: Possible small  right supraorbital scalp hematoma (series 7, image 24), otherwise no acute orbit or scalp soft tissue findings. Right frontal bone intact. Visualized paranasal sinuses and mastoids are clear. No acute osseous abnormality identified. Stable cerebral volume. No midline shift, ventriculomegaly, mass effect, evidence of mass lesion, intracranial hemorrhage or evidence of cortically based acute infarction. Gray-white matter differentiation is within normal limits throughout the brain. No suspicious intracranial vascular hyperdensity. IMPRESSION: 1. Stable and negative noncontrast CT appearance of the brain. 2. Questionable right supraorbital soft tissue injury. No underlying fracture. Electronically Signed   By: Genevie Ann M.D.   On: 10/31/2015 12:22    Medications:  Scheduled: . enoxaparin (LOVENOX) injection  40 mg Subcutaneous Q24H  . folic acid  1 mg Oral Daily  . levETIRAcetam  1,000 mg Intravenous Q12H  . multivitamin with minerals  1 tablet Oral Daily  . thiamine  100 mg Oral Daily   Or  . thiamine  100 mg Intravenous Daily    Assessment/Plan:  Break through seizure in setting of running out of her Keppra and taking her friends child liquid Keppra. No on 1 gram Keppra and doing well. Will need to have follow up with PCP or neurologist as out patient. CSW working on getting patient orange card. Expressed the importance of taking her medication and not running out. No driving, operating heavy machinery, perform activities at heights, swimming or participation in water activities until release by outpatient physician.  This has been discussed with patient.    Neurology S/O  Etta Quill PA-C [facility] [phone]  11/01/2015, 9:29 AM

## 2015-11-01 NOTE — Care Management Note (Signed)
Case Management Note  Patient Details  Name: Tina [name] MRN: [id number] Date of Birth: 1981-01-25  Subjective/Objective:                    Action/Plan: Patient was admitted with seizure due to medication non-compliance.  Patient is currently listed as self-pay. Will follow for discharge needs.  Expected Discharge Date:                  Expected Discharge Plan:     In-House Referral:     Discharge planning Services     Post Acute Care Choice:    Choice offered to:     DME Arranged:    DME Agency:     HH Arranged:    HH Agency:     Status of Service:  In process, will continue to follow  Medicare Important Message Given:    Date Medicare IM Given:    Medicare IM give by:    Date Additional Medicare IM Given:    Additional Medicare Important Message give by:     If discussed at [place] of Stay Meetings, dates discussed:    Additional CommentsRolm Baptise, RN 11/01/2015, 9:04 AM [phone]

## 2015-11-01 NOTE — Progress Notes (Signed)
Subjective: Tina [name] was seen and examined this AM.  She reports hx of uncontrolled epilepsy, even when she was taking medications regularly.  She has been out of Keppra since moving from [facility] and losing health insurance.  She reports increased fatigue in past few weeks.  No further seizures since yesterday.  This AM, she is feeling better, hungry.  Objective: Vital signs in last 24 hours: Filed Vitals:   10/31/15 2201 11/01/15 0245 11/01/15 0646 11/01/15 1000  BP: 108/67 101/53 93/54 113/73  Pulse: 78 70 68 70  Temp: 98.8 F (37.1 C) 98.8 F (37.1 C) 98 F (36.7 C) 98.2 F (36.8 C)  TempSrc: Oral Oral Oral Oral  Resp: 18 18 18 18   Height:      Weight:      SpO2:  100% 100% 100%   Weight change:   Intake/Output Summary (Last 24 hours) at 11/01/15 1020 Last data filed at 11/01/15 1007  Gross per 24 hour  Intake    120 ml  Output      2 ml  Net    118 ml   General: resting in bed in NAD HEENT: [place]/AT Cardiac: RRR, no rubs, murmurs or gallops Pulm: clear to auscultation bilaterally, moving normal volumes of air Abd: soft, nontender, nondistended, BS present Ext: warm and well perfused, no pedal edema Neuro: alert and oriented X3, responding appropriately, moving extremities spontaneously Psych:  Appropriate mood, affect, thought content and behavior  Lab Results: Basic Metabolic Panel:  Recent Labs Lab 10/31/15 1132  NA 140  K 4.2  CL 109  CO2 20*  GLUCOSE 86  BUN 8  CREATININE 0.87  CALCIUM 8.8*   Liver Function Tests:  Recent Labs Lab 10/31/15 1132  AST 23  ALT 21  ALKPHOS 35*  BILITOT 0.8  PROT 6.5  ALBUMIN 4.0   CBC:  Recent Labs Lab 10/31/15 1132  WBC 5.8  NEUTROABS 3.2  HGB 12.7  HCT 38.7  MCV 95.1  PLT 222   Cardiac Enzymes:  Recent Labs Lab 10/31/15 1132  CKTOTAL 256*   CBG:  Recent Labs Lab 10/31/15 1053  GLUCAP 77    Medications: I have reviewed the patient's current medications. Scheduled Meds: . enoxaparin  (LOVENOX) injection  40 mg Subcutaneous Q24H  . folic acid  1 mg Oral Daily  . levETIRAcetam  1,000 mg Intravenous Q12H  . multivitamin with minerals  1 tablet Oral Daily  . sertraline  25 mg Oral QHS  . thiamine  100 mg Oral Daily   Or  . thiamine  100 mg Intravenous Daily   Continuous Infusions: . sodium chloride 100 mL/hr at 10/31/15 1112   PRN Meds:.acetaminophen **OR** acetaminophen, LORazepam **OR** LORazepam   Assessment/Plan: 35 year old woman with PMH of epilepsy since childhood, MDD w/recent suicide attempt, EtOH and THC abuse here with seizures due to running out of Keppra.  Epileptic seizure:  Patient reports poorly controlled Epilepsy and says the seizures "come when they want to come."  Non-compliant with Keppra since moving from [facility] last month and losing her health insurance.  Now on Keppra 1g BID and doing well; no further seizures. - continue Keppra 1g BID at discharge - appreciate care manager helping with med assistance  - no driving, operating heavy machinery, swimming/water activities x 6 months or until cleared by neurology - follow-up in [facility] next week - follow-up with neurology in 2-4 weeks  MDD:  She was recently (12/26-12/28/17) hospitalized for suicide attempt (by cop).  She reported that she was intoxicated and in argument with her sister at the time and later denied SI.  She has appropriate mood and affect this AM.  Previously on Zoloft, Abilify, Trazodone, Trileptal.  However, she has been taking Trileptal and Zoloft intermittently and has completely run out of the other medications.  She has had hospitalizations for  - She agrees to restart Zoloft and will follow-up with her psychiatrist regarding further therapies. - continue suicide precautions during admission  THC/EtOH abuse:  Negative EtOH this admission and she says she "drinks socially."  UDS + for THC.  She has no signs of EtOH w/d that would have precipitated the seizures.  We explained the EtOH and  THC can lower the seizure threshold.   - advised cessation from above substances  Diet:  Regular VTE ppx:  [place] Lovenox Code status:  Full  Dispo: Ms. Carawan is a medically stable for discharge home once we are able to get her access to medications.  She will follow-up in [facility] next week and with her psychiatrist after discharge.  The patient does not have a current PCP (No Pcp Per Patient) and does need an [facility] hospital follow-up appointment after discharge.  The patient does not have transportation limitations that hinder transportation to clinic appointments.  .Services Needed at time of discharge: Y = Yes, Blank = No PT:   OT:   RN:   Equipment:   Other:     LOS: 1 day   Francesca Oman, DO 11/01/2015, 10:20 AM

## 2015-11-01 NOTE — Care Management Note (Signed)
Case Management Note  Patient Details  Name: Tina [name] MRN: [id number] Date of Birth: 1981-01-25  Subjective/Objective:                    Action/Plan: Received a call from Internal Medicine MD asking for CM to assist with patients medications at discharge. Patient without insurance and PCP listed. CM met with the patient to go over the plans at discharge. Patient is going to be followed by the Internal Medicine clinic at discharge. CM will assist the patient with a [facility] letter at discharge. CM continuing to follow.   Expected Discharge Date:                  Expected Discharge Plan:  Home/Self Care  In-House Referral:     Discharge planning Services  CM Consult  Post Acute Care Choice:    Choice offered to:     DME Arranged:    DME Agency:     HH Arranged:    HH Agency:     Status of Service:  In process, will continue to follow  Medicare Important Message Given:    Date Medicare IM Given:    Medicare IM give by:    Date Additional Medicare IM Given:    Additional Medicare Important Message give by:     If discussed at [place] of Stay Meetings, dates discussed:    Additional Comments:  Pollie Friar, RN 11/01/2015, 11:40 AM

## 2015-11-01 NOTE — Progress Notes (Signed)
Discharge orders received, Pt for discharge home today.. IV d/c'd. D/c instructions and RX given with verbalized understanding. Family at bedside to assist patient with discharge. Staff bought pt downstairs via wheelchair. 11/01/15 1623

## 2015-11-01 NOTE — Discharge Summary (Signed)
Patient Name:  Tina [name]  MRN: [id number]  PCP: No PCP Per Patient  DOB:  March 28, 1981       Date of Admission:  10/31/2015  Date of Discharge:  11/01/2015      Attending Physician: Dr. Axel Filler, MD         DISCHARGE DIAGNOSES: 1.   Epilepsy ([place]) 2.   Major depressive disorder, recurrent ([place]) 3.   Alcohol use disorder ([place]) 4.   Cannabis use disorder, mild, abuse   DISPOSITION AND FOLLOW-UP: Tina [name] is to follow-up with the listed providers as detailed below, at patient's visiting, please address following issues:  1) Assess compliance with Keppra 1g BID. 2) Assess compliance with Zoloft and consider increasing to 50mg  dose (she has remained on 25mg  dosing for some reason). 3) Confirm that she has follow-up arranged with Neurology and Psychiatry (Dr. Sabra Heck). 4) Has she been approved for Medicaid or obtained [employer]?   Follow-up Information    Follow up with [facility]. Go on 11/08/2015.   Why:  You have an appointment scheduled for Thursday Nov 08, 2015 at 1:15 pm to follow up this hospitalization and establish a new primary care provider    Contact information:   [address] [place] [id number]      Follow up with [facility].   Specialty:  Neurology   Why:  for hospital follow-up; someone will call you   Contact information:   [place] [phone]      Schedule an appointment as soon as possible for a visit with Nicholaus Bloom, MD.   Specialty:  Psychiatry   Why:  for psychiatric medicine managment   Contact information:   [place] [address] [phone]      Discharge Instructions    Ambulatory referral to Neurology    Complete by:  As directed   An appointment is requested in approximately: 2-4 weeks for seizure follow-up.     Call MD for:  difficulty  breathing, headache or visual disturbances    Complete by:  As directed      Call MD for:  extreme fatigue    Complete by:  As directed      Call MD for:  hives    Complete by:  As directed      Call MD for:  persistant dizziness or light-headedness    Complete by:  As directed      Call MD for:  persistant nausea and vomiting    Complete by:  As directed      Call MD for:  redness, tenderness, or signs of infection (pain, swelling, redness, odor or green/yellow discharge around incision site)    Complete by:  As directed      Call MD for:  severe uncontrolled pain    Complete by:  As directed      Call MD for:  temperature >100.4    Complete by:  As directed      Diet - low sodium heart healthy    Complete by:  As directed      Driving Restrictions    Complete by:  As directed   No driving for at least 6 weeks or until cleared by neurology.     Increase activity slowly    Complete by:  As directed      Other Restrictions  Complete by:  As directed   No operating heavy machinery, activities from heights, swimming/water activities for at least 6 weeks or until cleared by neurology.            DISCHARGE MEDICATIONS:   Medication List    TAKE these medications        albuterol 108 (90 Base) MCG/ACT inhaler  Commonly known as:  PROVENTIL HFA;VENTOLIN HFA  Inhale 2 puffs into the lungs every 4 (four) hours as needed for wheezing or shortness of breath.     ARIPiprazole 10 MG tablet  Commonly known as:  ABILIFY  Take 1 tablet (10 mg total) by mouth daily. For mood control     bismuth subsalicylate 99991111 [id number] suspension  Commonly known as:  PEPTO-BISMOL  Take 30 mLs by mouth every 6 (six) hours as needed.     cetirizine 10 MG tablet  Commonly known as:  ZYRTEC  Take 1 tablet (10 mg total) by mouth daily as needed for allergies.     fluticasone furoate-vilanterol 200-25 MCG/INH Aepb  Commonly known as:  BREO ELLIPTA  Inhale 1 puff into the lungs daily after  breakfast. For Asthma     hydrOXYzine 25 MG tablet  Commonly known as:  ATARAX/VISTARIL  Take 1 tablet (25 mg total) by mouth every 6 (six) hours as needed for anxiety.     levETIRAcetam 1000 MG tablet  Commonly known as:  KEPPRA  Take 1 tablet (1,000 mg total) by mouth 2 (two) times daily. For seizures     omeprazole 20 MG capsule  Commonly known as:  PRILOSEC  Take 1 capsule (20 mg total) by mouth daily.     OXcarbazepine 150 MG tablet  Commonly known as:  TRILEPTAL  Take 1 tablet (150 mg total) by mouth 2 (two) times daily. For mood stabilization     ranitidine 150 MG tablet  Commonly known as:  ZANTAC  Take 1 tablet (150 mg total) by mouth 2 (two) times daily.     sertraline 25 MG tablet  Commonly known as:  ZOLOFT  Take 1 tablet (25 mg total) by mouth daily. For depression     traZODone 50 MG tablet  Commonly known as:  DESYREL  Take 1 tablet (50 mg total) by mouth at bedtime. For sleep         CONSULTS:   Neurology   PROCEDURES PERFORMED:  Ct Head Wo Contrast  10/31/2015  CLINICAL DATA:  35 year old female with multiple seizures today, out of epilepsy medication for 1 week. Initial encounter. EXAM: CT HEAD WITHOUT CONTRAST TECHNIQUE: Contiguous axial images were obtained from the base of the skull through the vertex without intravenous contrast. COMPARISON:  08/29/2015 and earlier. FINDINGS: Possible small right supraorbital scalp hematoma (series 7, image 24), otherwise no acute orbit or scalp soft tissue findings. Right frontal bone intact. Visualized paranasal sinuses and mastoids are clear. No acute osseous abnormality identified. Stable cerebral volume. No midline shift, ventriculomegaly, mass effect, evidence of mass lesion, intracranial hemorrhage or evidence of cortically based acute infarction. Gray-white matter differentiation is within normal limits throughout the brain. No suspicious intracranial vascular hyperdensity. IMPRESSION: 1. Stable and negative  noncontrast CT appearance of the brain. 2. Questionable right supraorbital soft tissue injury. No underlying fracture. Electronically Signed   By: Genevie Ann M.D.   On: 10/31/2015 12:22     10/31/15 EEG:   Impression: This predominantly drowsy and asleep EEG is normal.  Clinical Correlation: A normal EEG does not exclude a  clinical diagnosis of epilepsy. There were no electrographic seizures in this study. Clinical correlation is advised. Ellouise Newer, M.D.  ADMISSION DATA: History of Present Illness: Mr. Wideman is a 35 year old female with epilepsy, history of asthma, history of stroke, polysubstance abuse [alcohol, THC], depression complicated by prior suicide attempts warranting inpatient psychiatric hospitalizations who presented today with seizure. On her exam, patient was very drowsy, so history was collected from chart review as her fianc was unavailable by phone.  Per the ED, she was brought by her fianc who reported that she noted prodromal frontal headache which then escalated to 6 episodes of seizure-like activity in the car prior to arrival. Though she denied recent illness, she did acknowledge running out of her medication as it was not authorized by physician to the pharmacy. She was her fianc's son's Keppra though the dose was unclear.  She has a history of multiple psychiatric hospitalizations, including 2 in the past year for attempted suicide [07/31/15-08/03/15, 09/10/15-09/12/15]. Medications at her most recent discharge included Keppra 500 mg twice daily, Trileptal 150 mg twice daily, Zoloft 25 mg daily, Abilify 10 mg daily, Atarax 25mg  every 6 hours as needed for anxiety. She also presents emergency department on 10/03/15 with seizure-like activity witnessed by bystanders to resolve after several seconds. She also acknowledged non-adherence to Keppra as well.  In the emergency department, she did have a brief 10-15 second episode of seizure-like activity which consisted of  right-sided leaning and "clenching." She was given Ativan 2 mg IV with resolution of her seizure-like activity. Head CT was also notable for possible supraorbital soft tissue injury but no acute process intracranially.  Physical Exam: Blood pressure 99/71, pulse 60, temperature 98 F (36.7 C), temperature source Oral, resp. rate 16, height 5\' 6"  (1.676 m), weight 140 lb (63.504 kg), SpO2 100 %.  General: Young African-American female, sleeping in bed HEENT: No scleral icterus, pupils with sluggish reaction to light Cardiac: Regular rate and rhythm, no rubs, murmurs or gallops Pulm: clear to auscultation bilaterally, no wheezes, rales, or rhonchi Abd: soft, nontender, nondistended, BS present Ext: warm and well perfused, 2+ pulses radial/dorsalis pedis bilaterally, no pedal edema Neuro: Opens eyes with sternal rub though answers questions with one-word answers before drifting back to sleep; moving all extremities freely  Labs: Basic Metabolic Panel:  Recent Labs (last 2 labs)      Recent Labs  10/31/15 1132  NA 140  K 4.2  CL 109  CO2 20*  GLUCOSE 86  BUN 8  CREATININE 0.87  CALCIUM 8.8*     Liver Function Tests:  Recent Labs (last 2 labs)      Recent Labs  10/31/15 1132  AST 23  ALT 21  ALKPHOS 35*  BILITOT 0.8  PROT 6.5  ALBUMIN 4.0     CBC:  Recent Labs (last 2 labs)      Recent Labs  10/31/15 1132  WBC 5.8  NEUTROABS 3.2  HGB 12.7  HCT 38.7  MCV 95.1  PLT 222     Cardiac Enzymes:  Recent Labs (last 2 labs)      Recent Labs  10/31/15 1132  CKTOTAL 256*     CBG:  Recent Labs (last 2 labs)      Recent Labs  10/31/15 1053  GLUCAP 77         Alcohol Level:  Recent Labs (last 2 labs)      Recent Labs  10/31/15 [place] <5     Urinalysis:  Recent  Labs (last 2 labs)      Recent Labs  10/31/15 [place] 1.006  PHURINE 7.5  GLUCOSEU  NEGATIVE  HGBUR NEGATIVE  BILIRUBINUR NEGATIVE  KETONESUR NEGATIVE  PROTEINUR NEGATIVE  NITRITE NEGATIVE  LEUKOCYTESUR NEGATIVE         10/31/15 UDS:  THC +  HOSPITAL COURSE:  Epileptic seizure:  The patient has a history of epilepsy since age 34. She was admitted to the hospital for seizures in the setting of medication noncompliance.  Prior to admission, she was not taking her home Keppra 500 mg PO BID for about 1 week. She reports that she ran out of Keppra due to lack of insurance to pay for medications.  She reportedly had 6 seizures prior to her arrival and 1 seizure witnessed in the ED at [facility].  Metabolic panel was within normal limits.  EEG showed no seizure activity.  CT head without contrast showed no acute intracranial process.  She was started on Keppra 1000 mg IV BID while admitted.  She was alert and oriented on the second day of admission and feeling well.  We arranged medication assistance [facility] letter) for her so she could get her medications.  We also discussed the importance of abstinence from alcohol and marijuana as these substances lower her seizure threshold.  She was also informed that she should not drive, operate heavy machinery, participate in activities at elevated heights or swim/participate in water activites for at least 6 months or until she has been released by neurology.  Upon discharge, she will continue Keppra 1000 mg PO BID.  She has an appointment with [facility] next week.  Follow-up will be arranged with Neurology for further management of her Epilepsy.  Major Depressive Disorder: She has a history of inpatient psychiatric hospitalizations and was recently hospitalized (09/10/15-09/12/15) for suicide attempt (by cop). Home medications include aripiprazole 10 mg PO daily, hydroxazine 25 mg Q6H prn anxiety, oxcarbazepine 150 mg PO BID, sertraline 25 mg PO nightly.  During her admission, home medications were held as  she was somnolent/postictal and the team was unaware of how regularly she took her home medications.  Her mood, affect and behavior were appropriate throughout her admission.  She reports taking sertraline and oxcarbazepine intermittently.  She has been out of other medications.  Upon discharge, she agrees to restart home sertraline 25 mg PO nightly.  She plans to go to scheduled follow up appointment with psychiatrist,  Dr. Sabra Heck on 12/12/15 for further management and to discuss restarting other psychiatric medications.    Polysubstance Abuse (ethanol and marijuana): UDS positive for THC on admission.  Blood alcohol level was <5 on this admission, however has been elevated on previous occasions.  She reports that she drinks socially.  We discussed the importance of abstinence from alcohol and marijuana as these substances lower her seizure threshold.   DISCHARGE DATA: Vital Signs: BP 113/73 mmHg  Pulse 70  Temp(Src) 98.2 F (36.8 C) (Oral)  Resp 18  Ht 5\' 6"  (1.676 m)  Wt 140 lb (63.504 kg)  BMI 22.61 kg/m2  SpO2 100%  Services Ordered on Discharge: Y = Yes; Blank = No PT:   OT:   RN:   Equipment:   Other:      Time Spent on Discharge: 35 min   Signed: Duwaine Maxin DO PGY3, Internal Medicine Resident 11/01/2015, 2:59 PM

## 2015-11-01 NOTE — Progress Notes (Signed)
Subjective: Tina [name] reports that she is feeling well this morning.  She notes that she has been feeling very tired over the last few days before she started having seizures yesterday.  Her last seizure was about 1 month ago while she was at work.  She recently moved from [place] and states that she has been having seizures "they come when they want to", but is unable to quantify regularity or identify triggers.  She reports that she has been taking trileptal and Zoloft for her MDD intermittently, maybe a couple of times weekly.  Reports that she stopped Abilify and hydroxyzine due to cost.    Objective: Vital signs in last 24 hours: Filed Vitals:   10/31/15 1748 10/31/15 2201 11/01/15 0245 11/01/15 0646  BP: 108/64 108/67 101/53 93/54  Pulse: 74 78 70 68  Temp: 98.7 F (37.1 C) 98.8 F (37.1 C) 98.8 F (37.1 C) 98 F (36.7 C)  TempSrc: Oral Oral Oral Oral  Resp: 18 18 18 18   Height:      Weight:      SpO2: 100%  100% 100%   Weight change:   Intake/Output Summary (Last 24 hours) at 11/01/15 0957 Last data filed at 11/01/15 [id number]  Gross per 24 hour  Intake      0 ml  Output      2 ml  Net     -2 ml   General Appearance: Young woman, resting comfortably in hospital bed, alert, cooperative, no distress Head: Normocephalic, without obvious abnormality, atraumatic Eyes: conjunctiva/corneas clear, EOM's intact Nose: Nares normal, septum midline, mucosa normal, no drainage Neck: symmetrical, trachea midline Back: Symmetric, no curvature Lungs: Clear to auscultation bilaterally, respirations unlabored Chest Wall: No tenderness or deformity Heart: Regular rate and rhythm, S1 and S2 normal, no murmur, rub or gallop Abdomen: Soft, non-tender, bowel sounds active all four quadrants,no masses, no organomegaly Extremities: Extremities normal, atraumatic, no cyanosis, no edema, multiple tattoos Pulses: 2+ and symmetric in all extremities Neuro: alert and oriented, no obvious  focal neurological deficit noted  Lab Results: Basic Metabolic Panel:  Recent Labs  10/31/15 1132  NA 140  K 4.2  CL 109  CO2 20*  GLUCOSE 86  BUN 8  CREATININE 0.87  CALCIUM 8.8*   Liver Function Tests:  Recent Labs  10/31/15 1132  AST 23  ALT 21  ALKPHOS 35*  BILITOT 0.8  PROT 6.5  ALBUMIN 4.0   CBC:  Recent Labs  10/31/15 1132  WBC 5.8  NEUTROABS 3.2  HGB 12.7  HCT 38.7  MCV 95.1  PLT 222   Cardiac Enzymes:  Recent Labs  10/31/15 1132  CKTOTAL 256*   CBG:  Recent Labs  10/31/15 1053  GLUCAP 77   Urine Drug Screen: Drugs of Abuse     Component Value Date/Time   LABOPIA NONE DETECTED 10/31/2015 1150   LABOPIA NEGATIVE 06/18/2007 1530   COCAINSCRNUR NONE DETECTED 10/31/2015 1150   COCAINSCRNUR NEGATIVE 06/18/2007 1530   LABBENZ NONE DETECTED 10/31/2015 1150   LABBENZ NEGATIVE 06/18/2007 1530   AMPHETMU NONE DETECTED 10/31/2015 1150   AMPHETMU NEGATIVE 06/18/2007 1530   THCU POSITIVE* 10/31/2015 1150   LABBARB NONE DETECTED 10/31/2015 1150    Alcohol Level:  Recent Labs  10/31/15 [place] <5   Urinalysis:  Recent Labs  10/31/15 1149  COLORURINE YELLOW  LABSPEC 1.006  PHURINE 7.5  GLUCOSEU NEGATIVE  HGBUR NEGATIVE  BILIRUBINUR NEGATIVE  KETONESUR NEGATIVE  PROTEINUR NEGATIVE  NITRITE NEGATIVE  LEUKOCYTESUR  NEGATIVE   Misc. Labs:   Levetiracetam level: pending Hep C antibody: pending HIV antibody: pending  Studies/Results: Ct Head Wo Contrast  10/31/2015  CLINICAL DATA:  35 year old female with multiple seizures today, out of epilepsy medication for 1 week. Initial encounter. EXAM: CT HEAD WITHOUT CONTRAST TECHNIQUE: Contiguous axial images were obtained from the base of the skull through the vertex without intravenous contrast. COMPARISON:  08/29/2015 and earlier. FINDINGS: Possible small right supraorbital scalp hematoma (series 7, image 24), otherwise no acute orbit or scalp soft tissue findings. Right frontal bone  intact. Visualized paranasal sinuses and mastoids are clear. No acute osseous abnormality identified. Stable cerebral volume. No midline shift, ventriculomegaly, mass effect, evidence of mass lesion, intracranial hemorrhage or evidence of cortically based acute infarction. Gray-white matter differentiation is within normal limits throughout the brain. No suspicious intracranial vascular hyperdensity. IMPRESSION: 1. Stable and negative noncontrast CT appearance of the brain. 2. Questionable right supraorbital soft tissue injury. No underlying fracture. Electronically Signed   By: Genevie Ann M.D.   On: 10/31/2015 12:22   Medications: I have reviewed the patient's current medications. Scheduled Meds: . enoxaparin (LOVENOX) injection  40 mg Subcutaneous Q24H  . folic acid  1 mg Oral Daily  . levETIRAcetam  1,000 mg Intravenous Q12H  . multivitamin with minerals  1 tablet Oral Daily  . thiamine  100 mg Oral Daily   Or  . thiamine  100 mg Intravenous Daily   Continuous Infusions: . sodium chloride 100 mL/hr at 10/31/15 1112   PRN Meds:.acetaminophen **OR** acetaminophen, LORazepam **OR** LORazepam Assessment/Plan: Active Problems:   Status epilepticus [facility])  Mr. Freid is a 35 year old woman with epilepsy, history of asthma, history of CVA, alcohol abuse, THC use, MDD with previous suicide attempts and psychiatric hospitalizations who was admitted for seizures.  #Seizure: Diagnosed with epilepsy at age 82.  Yesterday's seizure activity is most likely due to medication non compliance as patient has been out of her keppra for the last week.  It is also possible that her THC and alcohol use have lowered her seizure threshold.  Less concerned for alcohol withdrawal today as patient appears alert, oriented, calm, not diaphoretic, and not tremulous.  Intracranial process is unlikely given normal CT head and absence of seizure in the setting of keppra loading.  Spoke with Case Manager and she is already  working on getting [name] assistance for getting her medications. Discussed the importance of abstinence from alcohol and THC for at least 6 months as this can lower her seizure threshold.  Also counseled against driving for the next 6 months. Patient reports that her fiance is able to provide transportation to and from work and appeared agreeable to driving restrictions.  - Continue seizure precautions and CIWA - Continue levetiracetam 1000 mg IV Q12H - Neurology signed off today. Spoke with Etta Quill, PA regarding PO home dose of levetiracetam.  He said since she is on 1000 mg in hospital to send out on same PO.  - Case management consulted re medication assistance - Patient will need follow up as an outpatient for improved control of seizures  #Major Depressive Disorder/History of SI and SA: History of multiple psychiatric hospitalizations.  Recently discharged from inpatient psychiatric hospitalization for attempted suicide by cop.  Noncompliant with home medications which  include aripiprazole 10 mg PO daily, hydroxazine 25 mg Q6H prn anxiety, oxcarbazepine 150 mg PO BID, sertraline 25 mg PO nightly.  Patient reports taking sertraline and oxcarbazepine intermittently.  Discussed  restarting sertraline upon discharge and following up with her psychiatrist regarding restarting other medications and she agreed.  - Start sertraline 25 mg PO nightly  - Patient to follow up with psychiatrist.  Already has an appointment on March 29.   #History of Asthma: No PFTs in EMR, however patient has recently moved from [facility].  No wheezing on exam.  Patient to establish care with a primary care physician.     Dispo: Planning for home today.    This is a Careers information officer Note.  The care of the patient was discussed with Dr. Redmond Pulling and the assessment and plan formulated with their assistance.  Please see their attached note for official documentation of the daily encounter.   LOS: 1 day   Lovina Reach,  Med Student 11/01/2015, 9:57 AM

## 2015-11-02 LAB — LEVETIRACETAM LEVEL: Levetiracetam Lvl: 12.8 ug/mL (ref 10.0–40.0)

## 2015-11-02 LAB — HEPATITIS C ANTIBODY

## 2015-11-02 LAB — HIV ANTIBODY (ROUTINE TESTING W REFLEX): HIV Screen 4th Generation wRfx: NONREACTIVE

## 2015-11-07 ENCOUNTER — Telehealth: Payer: Self-pay | Admitting: General Practice

## 2015-11-07 NOTE — Telephone Encounter (Signed)
APPT REMINDER CALL, LMTCB IF SHE NEEDS TO CANCEL °

## 2015-11-08 ENCOUNTER — Ambulatory Visit (INDEPENDENT_AMBULATORY_CARE_PROVIDER_SITE_OTHER): Payer: PRIVATE HEALTH INSURANCE | Admitting: Internal Medicine

## 2015-11-08 VITALS — BP 109/71 | HR 69 | Temp 98.4°F | Ht 64.0 in | Wt 138.5 lb

## 2015-11-08 DIAGNOSIS — F339 Major depressive disorder, recurrent, unspecified: Secondary | ICD-10-CM

## 2015-11-08 DIAGNOSIS — R1013 Epigastric pain: Secondary | ICD-10-CM

## 2015-11-08 DIAGNOSIS — J301 Allergic rhinitis due to pollen: Secondary | ICD-10-CM

## 2015-11-08 DIAGNOSIS — J309 Allergic rhinitis, unspecified: Secondary | ICD-10-CM | POA: Diagnosis not present

## 2015-11-08 DIAGNOSIS — G40301 Generalized idiopathic epilepsy and epileptic syndromes, not intractable, with status epilepticus: Secondary | ICD-10-CM

## 2015-11-08 DIAGNOSIS — G40909 Epilepsy, unspecified, not intractable, without status epilepticus: Secondary | ICD-10-CM

## 2015-11-08 DIAGNOSIS — F331 Major depressive disorder, recurrent, moderate: Secondary | ICD-10-CM

## 2015-11-08 DIAGNOSIS — K2970 Gastritis, unspecified, without bleeding: Secondary | ICD-10-CM

## 2015-11-08 MED ORDER — SERTRALINE HCL 50 MG PO TABS: 50.0000 mg | ORAL_TABLET | Freq: Every day | ORAL | Status: DC

## 2015-11-08 MED ORDER — OMEPRAZOLE 40 MG PO CPDR: 40.0000 mg | DELAYED_RELEASE_CAPSULE | Freq: Every day | ORAL | Status: DC

## 2015-11-08 MED ORDER — CETIRIZINE HCL 10 MG PO TABS: 10.0000 mg | ORAL_TABLET | Freq: Every day | ORAL | Status: DC | PRN

## 2015-11-08 NOTE — Patient Instructions (Signed)
1. Please make a follow up appointment in 6-8 weeks, after you follow up with psychiatry.   2. Please take all medications as previously prescribed with the following changes:  Increase Zoloft to 50 mg daily.   Increase Omeprazole to 40 mg daily.   I will call you if there are abnormalities with your labs, otherwise we will discuss them at your follow up.   Do not miss any doses of you Keppra.   3. If you have worsening of your symptoms or new symptoms arise, please call the clinic [phone]), or go to the ER immediately if symptoms are severe.

## 2015-11-08 NOTE — Progress Notes (Signed)
Subjective:   Patient ID: Tina [name] female   DOB: 1981-06-11 35 y.o.   MRN: [id number]  HPI: [name] is a 35 y.o. female w/ PMHx of seizures, asthma, and h/o CVA, presents to the clinic today for a hospital follow-up visit. This is the first time she has been seen in our clinic. She was recently discharged after having seizure activity at home as well as in the ED. Her dose of Keppra was increased to 1000 mg bid and since that time she has not had any further seizures.   Patient also has a significant psych history with previous admissions to behavioral health for suicidal ideations. She has been taking Abilify, Zoloft, and Trileptal for her mood, but says she still has significant symptoms of depression based on her description. No suicidal ideations.   She also mentions that she has been having dark stools for quite some time. She admits to epigastric pain as well. Since she has been having epigastric pain she has been taking Pepto Bismol, but feels she has even had dark stools prior to taking this.   Patient states she drinks rarely, does not smoke, or use recreational drugs. Accompanied by her significant other today.   Past Medical History  Diagnosis Date  . Epilepsy ([place])   . Asthma   . Stroke ([place])   . Seizures ([place])     last seizure 07-2015    Family History  Problem Relation Age of Onset  . Heart failure Mother   . Cancer Father   . Seizures Father     Current Outpatient Prescriptions  Medication Sig Dispense Refill  . albuterol (PROVENTIL HFA;VENTOLIN HFA) 108 (90 Base) MCG/ACT inhaler Inhale 2 puffs into the lungs every 4 (four) hours as needed for wheezing or shortness of breath. 1 Inhaler 1  . ARIPiprazole (ABILIFY) 10 MG tablet Take 1 tablet (10 mg total) by mouth daily. For mood control 30 tablet 0  . bismuth subsalicylate (PEPTO-BISMOL) 262 MG/15ML suspension Take 30 mLs by mouth every 6 (six) hours as needed. 360 mL 0  .  cetirizine (ZYRTEC) 10 MG tablet Take 1 tablet (10 mg total) by mouth daily as needed for allergies. 30 tablet 1  . Fluticasone Furoate-Vilanterol (BREO ELLIPTA) 200-25 MCG/INH AEPB Inhale 1 puff into the lungs daily after breakfast. For Asthma    . hydrOXYzine (ATARAX/VISTARIL) 25 MG tablet Take 1 tablet (25 mg total) by mouth every 6 (six) hours as needed for anxiety. 45 tablet 0  . levETIRAcetam (KEPPRA) 1000 MG tablet Take 1 tablet (1,000 mg total) by mouth 2 (two) times daily. For seizures 60 tablet 1  . omeprazole (PRILOSEC) 40 MG capsule Take 1 capsule (40 mg total) by mouth daily. 30 capsule 5  . OXcarbazepine (TRILEPTAL) 150 MG tablet Take 1 tablet (150 mg total) by mouth 2 (two) times daily. For mood stabilization 60 tablet 0  . ranitidine (ZANTAC) 150 MG tablet Take 1 tablet (150 mg total) by mouth 2 (two) times daily. 60 tablet 0  . sertraline (ZOLOFT) 50 MG tablet Take 1 tablet (50 mg total) by mouth at bedtime. For depression 30 tablet 5   No current facility-administered medications for this visit.    Review of Systems: General: Positive for fatigue and decreased appetite. Denies fever, chills, diaphoresis.  Respiratory: Denies SOB, DOE, cough, and wheezing.   Cardiovascular: Denies chest pain and palpitations.  Gastrointestinal: Positive for abdominal pain. Denies nausea, vomiting, and diarrhea.  Genitourinary: Denies dysuria, increased  frequency, and flank pain. Endocrine: Denies hot or cold intolerance, polyuria, and polydipsia. Musculoskeletal: Denies myalgias, back pain, joint swelling, arthralgias and gait problem.  Skin: Denies pallor, rash and wounds.  Neurological: Denies dizziness, seizures, syncope, weakness, lightheadedness, numbness and headaches.  Psychiatric/Behavioral: Positive for mood changes and sleep disturbances.   Objective:   Physical Exam: Filed Vitals:   11/08/15 1323  BP: 109/71  Pulse: 69  Temp: 98.4 F (36.9 C)  TempSrc: Oral  Height: 5'  4" (1.626 m)  Weight: 138 lb 8 oz (62.823 kg)  SpO2: 100%    General: Pleasant AA female, alert, cooperative, NAD. HEENT: PERRL, EOMI. Moist mucus membranes Neck: Full range of motion without pain, supple, no lymphadenopathy or carotid bruits Lungs: Clear to ascultation bilaterally, normal work of respiration, no wheezes, rales, rhonchi Heart: RRR, no murmurs, gallops, or rubs Abdomen: Soft, mild epigastric tenderness, non-distended, BS + Extremities: No cyanosis, clubbing, or edema Neurologic: Alert & oriented X3, cranial nerves II-XII intact, strength grossly intact, sensation intact to light touch   Assessment & Plan:   Please see problem based assessment and plan.

## 2015-11-09 LAB — CBC WITH DIFFERENTIAL/PLATELET
BASOS ABS: 0 10*3/uL (ref 0.0–0.2)
Basos: 0 %
EOS (ABSOLUTE): 0.1 10*3/uL (ref 0.0–0.4)
Eos: 2 %
HEMOGLOBIN: 12 g/dL (ref 11.1–15.9)
Hematocrit: 35.8 % (ref 34.0–46.6)
IMMATURE GRANS (ABS): 0 10*3/uL (ref 0.0–0.1)
Immature Granulocytes: 0 %
LYMPHS: 32 %
Lymphocytes Absolute: 1.7 10*3/uL (ref 0.7–3.1)
MCH: 32.3 pg (ref 26.6–33.0)
MCHC: 33.5 g/dL (ref 31.5–35.7)
MCV: 97 fL (ref 79–97)
MONOCYTES: 6 %
Monocytes Absolute: 0.3 10*3/uL (ref 0.1–0.9)
Neutrophils Absolute: 3.2 10*3/uL (ref 1.4–7.0)
Neutrophils: 60 %
Platelets: 289 10*3/uL (ref 150–379)
RBC: 3.71 x10E6/uL — AB (ref 3.77–5.28)
RDW: 12.9 % (ref 12.3–15.4)
WBC: 5.4 10*3/uL (ref 3.4–10.8)

## 2015-11-09 LAB — FERRITIN: FERRITIN: 26 ng/mL (ref 15–150)

## 2015-11-09 NOTE — Assessment & Plan Note (Signed)
No recent seizures since increased dose of Keppra. Now taking 1000 mg bid.  -Continue Keppra -Referral placed for neurology

## 2015-11-09 NOTE — Assessment & Plan Note (Signed)
Refilled Zyrtec

## 2015-11-09 NOTE — Assessment & Plan Note (Signed)
Suspect patient has a mild gastritis based on her ongoing epigastric discomfort. Also mentions dark stools. Suspect this is most likely related to her use of daily pepto bismol. Checked CBC, Hb is normal, do not suspect she is actually bleeding. Discussed that Pepto can cause dark stools.  -Increase Omeprazole to 40 mg daily.

## 2015-11-09 NOTE — Assessment & Plan Note (Signed)
Patient with extensive psych history, has previous admissions for suicidal ideations to behavioral health. Taking Abilify and Zoloft 25 mg daily. Also takes Trileptal as a mood stabilizer. Feels her depression is still a significant issue for her. Denies suicidal ideations. Seems to be in good spirits, somewhat flat affect on exam.  -Increase Zoloft to 50 mg daily -Has follow up appointment with Psychiatrist on 12/11/15

## 2015-11-10 ENCOUNTER — Encounter (HOSPITAL_COMMUNITY): Payer: Self-pay | Admitting: Emergency Medicine

## 2015-11-10 ENCOUNTER — Emergency Department (HOSPITAL_COMMUNITY)
Admission: EM | Admit: 2015-11-10 | Discharge: 2015-11-10 | Disposition: A | Discharge status: Home / Self Care | Payer: PRIVATE HEALTH INSURANCE | Attending: Emergency Medicine | Admitting: Emergency Medicine

## 2015-11-10 DIAGNOSIS — Z880 Allergy status to penicillin: Secondary | ICD-10-CM | POA: Insufficient documentation

## 2015-11-10 DIAGNOSIS — Z9114 Patient's other noncompliance with medication regimen: Secondary | ICD-10-CM | POA: Insufficient documentation

## 2015-11-10 DIAGNOSIS — Z8673 Personal history of transient ischemic attack (TIA), and cerebral infarction without residual deficits: Secondary | ICD-10-CM | POA: Insufficient documentation

## 2015-11-10 DIAGNOSIS — Z79899 Other long term (current) drug therapy: Secondary | ICD-10-CM | POA: Insufficient documentation

## 2015-11-10 DIAGNOSIS — R569 Unspecified convulsions: Secondary | ICD-10-CM | POA: Insufficient documentation

## 2015-11-10 DIAGNOSIS — F17210 Nicotine dependence, cigarettes, uncomplicated: Secondary | ICD-10-CM | POA: Insufficient documentation

## 2015-11-10 DIAGNOSIS — J45909 Unspecified asthma, uncomplicated: Secondary | ICD-10-CM | POA: Insufficient documentation

## 2015-11-10 DIAGNOSIS — Z7951 Long term (current) use of inhaled steroids: Secondary | ICD-10-CM | POA: Insufficient documentation

## 2015-11-10 MED ORDER — SODIUM CHLORIDE 0.9 % IV SOLN
1000.0000 mg | Freq: Once | INTRAVENOUS | Status: AC
  Administered 2015-11-10: 1000 mg via INTRAVENOUS
  Filled 2015-11-10: qty 10

## 2015-11-10 MED ORDER — LEVETIRACETAM 1000 MG PO TABS: 1000.0000 mg | ORAL_TABLET | Freq: Two times a day (BID) | ORAL | Status: DC

## 2015-11-10 NOTE — ED Notes (Signed)
Patient refused to sign discharge form.  Patient left department with discharge paperwork.

## 2015-11-10 NOTE — ED Provider Notes (Signed)
CSN: [id number]     Arrival date & time 11/10/15  [id number] History   First MD Initiated Contact with Patient 11/10/15 0444    Level V caveat for post ictal state.   Chief Complaint  Patient presents with  . Seizures     (Consider location/radiation/quality/duration/timing/severity/associated sxs/prior Treatment) Patient is a 35 y.o. female presenting with seizures. The history is provided by the EMS personnel and the patient. The history is limited by the condition of the patient (Post ictal).  Seizures She has a known seizure disorder and apparently had a seizure while out with friends. She is supposed to be taking Keppra, but she admits to missing some doses. She is somewhat drowsy and difficult to get a history from at the current time.  Past Medical History  Diagnosis Date  . Epilepsy ([place])   . Asthma   . Stroke ([place])   . Seizures ([place])     last seizure 07-2015   Past Surgical History  Procedure Laterality Date  . Umbilical hernia repair  12/2014   Family History  Problem Relation Age of Onset  . Heart failure Mother   . Cancer Father   . Seizures Father    Social History  Substance Use Topics  . Smoking status: Current Every Day Smoker -- 0.50 packs/day    Types: Cigarettes  . Smokeless tobacco: None  . Alcohol Use: 0.6 oz/week    1 Cans of beer per week     Comment: occ   OB History    Gravida Para Term Preterm AB TAB SAB Ectopic Multiple Living   0 0 0 0 0 0 0 0       Review of Systems  Unable to perform ROS: Mental status change  Neurological: Positive for seizures.      Allergies  Bee venom; Chocolate; Mosquito (diagnostic); Orange fruit; Oregano; Penicillins; Dilantin; Garlic; Tramadol; and Tape  Home Medications   Prior to Admission medications   Medication Sig Start Date End Date Taking? Authorizing Provider  albuterol (PROVENTIL HFA;VENTOLIN HFA) 108 (90 Base) MCG/ACT inhaler Inhale 2 puffs into the lungs every 4 (four) hours as needed for  wheezing or shortness of breath. 09/12/15   Encarnacion Slates, NP  ARIPiprazole (ABILIFY) 10 MG tablet Take 1 tablet (10 mg total) by mouth daily. For mood control 09/12/15   Encarnacion Slates, NP  bismuth subsalicylate (PEPTO-BISMOL) 262 MG/15ML suspension Take 30 mLs by mouth every 6 (six) hours as needed. 09/21/15   Leo Grosser, MD  cetirizine (ZYRTEC) 10 MG tablet Take 1 tablet (10 mg total) by mouth daily as needed for allergies. 11/08/15   Corky Sox, MD  Fluticasone Furoate-Vilanterol (BREO ELLIPTA) 200-25 MCG/INH AEPB Inhale 1 puff into the lungs daily after breakfast. For Asthma 09/12/15   Encarnacion Slates, NP  hydrOXYzine (ATARAX/VISTARIL) 25 MG tablet Take 1 tablet (25 mg total) by mouth every 6 (six) hours as needed for anxiety. 09/12/15   Encarnacion Slates, NP  levETIRAcetam (KEPPRA) 1000 MG tablet Take 1 tablet (1,000 mg total) by mouth 2 (two) times daily. For seizures 11/01/15   Francesca Oman, DO  omeprazole (PRILOSEC) 40 MG capsule Take 1 capsule (40 mg total) by mouth daily. 11/08/15   Corky Sox, MD  OXcarbazepine (TRILEPTAL) 150 MG tablet Take 1 tablet (150 mg total) by mouth 2 (two) times daily. For mood stabilization 09/12/15   Encarnacion Slates, NP  ranitidine (ZANTAC) 150 MG tablet Take 1 tablet (150 mg total)  by mouth 2 (two) times daily. 09/21/15   Leo Grosser, MD  sertraline (ZOLOFT) 50 MG tablet Take 1 tablet (50 mg total) by mouth at bedtime. For depression 11/08/15   Corky Sox, MD   BP 115/85 mmHg  Pulse 70  Temp(Src) 98.2 F (36.8 C) (Oral)  Resp 16  SpO2 100%  LMP 11/03/2015 Physical Exam  Nursing note and vitals reviewed.  35 year old female, resting comfortably and in no acute distress. Vital signs are normal. Oxygen saturation is 100%, which is normal. Head is normocephalic and atraumatic. PERRLA, EOMI. Oropharynx is clear. Neck is nontender and supple without adenopathy or JVD. Back is nontender and there is no CVA tenderness. Lungs are clear without rales, wheezes, or  rhonchi. Chest is nontender. Heart has regular rate and rhythm without murmur. Abdomen is soft, flat, nontender without masses or hepatosplenomegaly and peristalsis is normoactive. Extremities have no cyanosis or edema, full range of motion is present. Skin is warm and dry without rash. Neurologic: She is drowsy but arousable to voice. She is oriented to person but not place, cranial nerves are intact, there are no motor or sensory deficits.  ED Course  Procedures (including critical care time)  MDM   Final diagnoses:  Seizure ([place])  Noncompliance with medication regimen    Seizure with postictal state. Old records are reviewed confirming prior ED visits for seizures secondary to medication noncompliance. Unfortunately, drug levels of levetiracetam are not available from the ED. She will be given a loading dose and be observed until she is past the postictal state.   Patient is reevaluated and is now awake and alert. She is discharged with a new prescription for her levetiracetam and referred back to her primary care provider for follow-up. Importance of medication compliance was stressed.  Delora Fuel, MD [id number]

## 2015-11-10 NOTE — ED Notes (Signed)
Attempted to wake up pt, pt still very hard to arouse. Post ictal. Pt unable to keep eyes open without repeated stimulation

## 2015-11-10 NOTE — ED Notes (Signed)
Pt in EMS, pt had seizure on way home from being out with friends. Pt has hx. Given 2.5 Midazolam. Pt post ictal

## 2015-11-10 NOTE — Discharge Instructions (Signed)
You must take your medication every day, or you will have more seizures!  Epilepsy Epilepsy is a disorder in which a person has repeated seizures over time. A seizure is a release of abnormal electrical activity in the brain. Seizures can cause a change in attention, behavior, or the ability to remain awake and alert (altered mental status). Seizures often involve uncontrollable shaking (convulsions).  Most people with epilepsy lead normal lives. However, people with epilepsy are at an increased risk of falls, accidents, and injuries. Therefore, it is important to begin treatment right away. CAUSES  Epilepsy has many possible causes. Anything that disturbs the normal pattern of brain cell activity can lead to seizures. This may include:   Head injury.  Birth trauma.  High fever as a child.  Stroke.  Bleeding into or around the brain.  Certain drugs.  Prolonged low oxygen, such as what occurs after CPR efforts.  Abnormal brain development.  Certain illnesses, such as meningitis, encephalitis (brain infection), malaria, and other infections.  An imbalance of nerve signaling chemicals (neurotransmitters).  SIGNS AND SYMPTOMS  The symptoms of a seizure can vary greatly from one person to another. Right before a seizure, you may have a warning (aura) that a seizure is about to occur. An aura may include the following symptoms:  Fear or anxiety.  Nausea.  Feeling like the room is spinning (vertigo).  Vision changes, such as seeing flashing lights or spots. Common symptoms during a seizure include:  Abnormal sensations, such as an abnormal smell or a bitter taste in the mouth.   Sudden, general body stiffness.   Convulsions that involve rhythmic jerking of the face, arm, or leg on one or both sides.   Sudden change in consciousness.   Appearing to be awake but not responding.   Appearing to be asleep but cannot be awakened.   Grimacing, chewing, lip smacking,  drooling, tongue biting, or loss of bowel or bladder control. After a seizure, you may feel sleepy for a while. DIAGNOSIS  Your health care provider will ask about your symptoms and take a medical history. Descriptions from any witnesses to your seizures will be very helpful in the diagnosis. A physical exam, including a detailed neurological exam, is necessary. Various tests may be done, such as:   An electroencephalogram (EEG). This is a painless test of your brain waves. In this test, a diagram is created of your brain waves. These diagrams can be interpreted by a specialist.  An MRI of the brain.   A CT scan of the brain.   A spinal tap (lumbar puncture, LP).  Blood tests to check for signs of infection or abnormal blood chemistry. TREATMENT  There is no cure for epilepsy, but it is generally treatable. Once epilepsy is diagnosed, it is important to begin treatment as soon as possible. For most people with epilepsy, seizures can be controlled with medicines. The following may also be used:  A pacemaker for the brain (vagus nerve stimulator) can be used for people with seizures that are not well controlled by medicine.  Surgery on the brain. For some people, epilepsy eventually goes away. HOME CARE INSTRUCTIONS   Follow your health care provider's recommendations on driving and safety in normal activities.  Get enough rest. Lack of sleep can cause seizures.  Only take over-the-counter or prescription medicines as directed by your health care provider. Take any prescribed medicine exactly as directed.  Avoid any known triggers of your seizures.  Keep a seizure diary.  Record what you recall about any seizure, especially any possible trigger.   Make sure the people you live and work with know that you are prone to seizures. They should receive instructions on how to help you. In general, a witness to a seizure should:   Cushion your head and body.   Turn you on your side.    Avoid unnecessarily restraining you.   Not place anything inside your mouth.   Call for emergency medical help if there is any question about what has occurred.   Follow up with your health care provider as directed. You may need regular blood tests to monitor the levels of your medicine.  SEEK MEDICAL CARE IF:   You develop signs of infection or other illness. This might increase the risk of a seizure.   You seem to be having more frequent seizures.   Your seizure pattern is changing.  SEEK IMMEDIATE MEDICAL CARE IF:   You have a seizure that does not stop after a few moments.   You have a seizure that causes any difficulty in breathing.   You have a seizure that results in a very severe headache.   You have a seizure that leaves you with the inability to speak or use a part of your body.    This information is not intended to replace advice given to you by your health care provider. Make sure you discuss any questions you have with your health care provider.   Document Released: 09/01/2005 Document Revised: 06/22/2013 Document Reviewed: 04/13/2013 Elsevier Interactive Patient Education [employer].

## 2015-11-12 NOTE — Progress Notes (Signed)
Internal Medicine Clinic Attending  Case discussed with Dr. Jones at the time of the visit.  We reviewed the resident's history and exam and pertinent patient test results.  I agree with the assessment, diagnosis, and plan of care documented in the resident's note.  

## 2015-11-21 NOTE — Addendum Note (Signed)
Addended byCorky Sox on: 11/21/2015 11:31 AM   Modules accepted: Level of Service

## 2015-12-03 ENCOUNTER — Encounter (HOSPITAL_COMMUNITY): Payer: Self-pay | Admitting: Emergency Medicine

## 2015-12-03 ENCOUNTER — Emergency Department (HOSPITAL_COMMUNITY)
Admission: EM | Admit: 2015-12-03 | Discharge: 2015-12-03 | Disposition: A | Discharge status: Home / Self Care | Payer: PRIVATE HEALTH INSURANCE | Attending: Emergency Medicine | Admitting: Emergency Medicine

## 2015-12-03 DIAGNOSIS — R1110 Vomiting, unspecified: Secondary | ICD-10-CM | POA: Diagnosis not present

## 2015-12-03 DIAGNOSIS — J45909 Unspecified asthma, uncomplicated: Secondary | ICD-10-CM | POA: Diagnosis not present

## 2015-12-03 DIAGNOSIS — F17210 Nicotine dependence, cigarettes, uncomplicated: Secondary | ICD-10-CM | POA: Insufficient documentation

## 2015-12-03 DIAGNOSIS — R197 Diarrhea, unspecified: Secondary | ICD-10-CM | POA: Diagnosis not present

## 2015-12-03 DIAGNOSIS — R109 Unspecified abdominal pain: Secondary | ICD-10-CM | POA: Diagnosis not present

## 2015-12-03 LAB — COMPREHENSIVE METABOLIC PANEL
ALBUMIN: 4.2 g/dL (ref 3.5–5.0)
ALK PHOS: 41 U/L (ref 38–126)
ALT: 18 U/L (ref 14–54)
ANION GAP: 11 (ref 5–15)
AST: 23 U/L (ref 15–41)
BILIRUBIN TOTAL: 0.5 mg/dL (ref 0.3–1.2)
BUN: 13 mg/dL (ref 6–20)
CALCIUM: 8.9 mg/dL (ref 8.9–10.3)
CO2: 21 mmol/L — ABNORMAL LOW (ref 22–32)
CREATININE: 0.83 mg/dL (ref 0.44–1.00)
Chloride: 107 mmol/L (ref 101–111)
GLUCOSE: 90 mg/dL (ref 65–99)
Potassium: 3.7 mmol/L (ref 3.5–5.1)
Sodium: 139 mmol/L (ref 135–145)
Total Protein: 7.4 g/dL (ref 6.5–8.1)

## 2015-12-03 LAB — CBC
HCT: 39.1 % (ref 36.0–46.0)
Hemoglobin: 13.1 g/dL (ref 12.0–15.0)
MCH: 31.3 pg (ref 26.0–34.0)
MCHC: 33.5 g/dL (ref 30.0–36.0)
MCV: 93.5 fL (ref 78.0–100.0)
PLATELETS: 238 10*3/uL (ref 150–400)
RBC: 4.18 MIL/uL (ref 3.87–5.11)
RDW: 12.1 % (ref 11.5–15.5)
WBC: 3.5 10*3/uL — AB (ref 4.0–10.5)

## 2015-12-03 LAB — I-STAT BETA HCG BLOOD, ED (MC, WL, AP ONLY): I-stat hCG, quantitative: <5 m[IU]/mL (ref <5)

## 2015-12-03 LAB — LIPASE, BLOOD: Lipase: 25 U/L (ref 11–51)

## 2015-12-03 NOTE — ED Notes (Signed)
Called for room x3 °

## 2015-12-03 NOTE — ED Notes (Signed)
Pt called for room x2 

## 2015-12-03 NOTE — ED Notes (Signed)
Pt c/o N/V/D x 3 days with abd pain

## 2015-12-07 ENCOUNTER — Ambulatory Visit (INDEPENDENT_AMBULATORY_CARE_PROVIDER_SITE_OTHER): Payer: PRIVATE HEALTH INSURANCE | Admitting: Neurology

## 2015-12-07 ENCOUNTER — Encounter: Payer: Self-pay | Admitting: Neurology

## 2015-12-07 VITALS — BP 105/74 | HR 54 | Ht 64.0 in | Wt 130.2 lb

## 2015-12-07 DIAGNOSIS — Z9119 Patient's noncompliance with other medical treatment and regimen: Secondary | ICD-10-CM

## 2015-12-07 DIAGNOSIS — G40309 Generalized idiopathic epilepsy and epileptic syndromes, not intractable, without status epilepticus: Secondary | ICD-10-CM | POA: Diagnosis not present

## 2015-12-07 DIAGNOSIS — Z91199 Patient's noncompliance with other medical treatment and regimen due to unspecified reason: Secondary | ICD-10-CM | POA: Insufficient documentation

## 2015-12-07 DIAGNOSIS — F445 Conversion disorder with seizures or convulsions: Secondary | ICD-10-CM | POA: Diagnosis not present
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
## 2015-12-07 NOTE — Progress Notes (Signed)
Faxed completed form to [facility] to schedule pt for in-home 72-hour EEG. Fax: [phone]. Received confirmation.   

## 2015-12-07 NOTE — Patient Instructions (Signed)
Remember to drink plenty of fluid, eat healthy meals and do not skip any meals. Try to eat protein with a every meal and eat a healthy snack such as fruit or nuts in between meals. Try to keep a regular sleep-wake schedule and try to exercise daily, particularly in the form of walking, 20-30 minutes a day, if you can.   As far as your medications are concerned, I would like to suggest: Keppra 1000mg  twice a day Trileptal 150mg  twice a day  As far as diagnostic testing: MRI of the brain, 3-day EEG  I would like to see you back in 3 months, sooner if we need to. Please call us with any interim questions, concerns, problems, updates or refill requests.   Please also call us for any test results so we can go over those with you on the phone.  My clinical assistant and will answer any of your questions and relay your messages to me and also relay most of my messages to you.   Our phone number is [phone]. We also have an after hours call service for urgent matters and there is a physician on-call for urgent questions. For any emergencies you know to call 911 or go to the nearest emergency room

## 2015-12-10 ENCOUNTER — Telehealth: Payer: Self-pay | Admitting: *Deleted

## 2015-12-10 ENCOUNTER — Encounter: Payer: Self-pay | Admitting: *Deleted

## 2015-12-10 LAB — LEVETIRACETAM LEVEL: LEVETIRACETAM: 29.9 ug/mL (ref 10.0–40.0)

## 2015-12-10 NOTE — Progress Notes (Signed)
Received physician status notification from [facility] that they received referral and they will contact us once pt is scheduled for 72hr AMG EEG.   

## 2015-12-10 NOTE — Telephone Encounter (Signed)
Patient notes and imaging report on [facility] desk.

## 2015-12-10 NOTE — Telephone Encounter (Signed)
-----   Message from Melvenia Beam, MD sent at 12/10/2015  8:05 AM EDT ----- Keppra level looks good thanks

## 2015-12-10 NOTE — Telephone Encounter (Signed)
Release fax to Dr Joseph Berkshire requesting records on 12/07/15.

## 2015-12-10 NOTE — Telephone Encounter (Signed)
Called and spoke to pt about keppra level looks good per Dr Jaynee Eagles. Pt verbalized understanding.

## 2015-12-11 NOTE — Progress Notes (Signed)
Received physician status notification from [facility]. Pt scheduled 12/17/15-12/20/15 for 72-hr AMB EEG.

## 2015-12-12 ENCOUNTER — Ambulatory Visit (HOSPITAL_COMMUNITY): Payer: Self-pay | Admitting: Clinical

## 2015-12-20 ENCOUNTER — Ambulatory Visit (INDEPENDENT_AMBULATORY_CARE_PROVIDER_SITE_OTHER): Payer: PRIVATE HEALTH INSURANCE | Admitting: Internal Medicine

## 2015-12-20 ENCOUNTER — Encounter: Payer: Self-pay | Admitting: Internal Medicine

## 2015-12-20 VITALS — BP 109/66 | HR 75 | Temp 98.0°F | Ht 64.0 in | Wt 136.3 lb

## 2015-12-20 DIAGNOSIS — R1310 Dysphagia, unspecified: Secondary | ICD-10-CM | POA: Diagnosis not present

## 2015-12-20 DIAGNOSIS — F331 Major depressive disorder, recurrent, moderate: Secondary | ICD-10-CM

## 2015-12-20 DIAGNOSIS — R1084 Generalized abdominal pain: Secondary | ICD-10-CM | POA: Diagnosis not present

## 2015-12-20 DIAGNOSIS — G40909 Epilepsy, unspecified, not intractable, without status epilepticus: Secondary | ICD-10-CM | POA: Diagnosis not present

## 2015-12-20 DIAGNOSIS — K2970 Gastritis, unspecified, without bleeding: Secondary | ICD-10-CM

## 2015-12-20 DIAGNOSIS — J309 Allergic rhinitis, unspecified: Secondary | ICD-10-CM

## 2015-12-20 DIAGNOSIS — G40301 Generalized idiopathic epilepsy and epileptic syndromes, not intractable, with status epilepticus: Secondary | ICD-10-CM

## 2015-12-20 DIAGNOSIS — R634 Abnormal weight loss: Secondary | ICD-10-CM | POA: Diagnosis not present

## 2015-12-20 DIAGNOSIS — J301 Allergic rhinitis due to pollen: Secondary | ICD-10-CM

## 2015-12-20 MED ORDER — FLUTICASONE FUROATE-VILANTEROL 200-25 MCG/INH IN AEPB: 1.0000 | INHALATION_SPRAY | Freq: Every day | RESPIRATORY_TRACT | Status: DC

## 2015-12-20 MED ORDER — ALBUTEROL SULFATE HFA 108 (90 BASE) MCG/ACT IN AERS: 2.0000 | INHALATION_SPRAY | RESPIRATORY_TRACT | Status: DC | PRN

## 2015-12-20 NOTE — Progress Notes (Signed)
Subjective:   Patient ID: Katisha Duddy female   DOB: [birth year] 35 y.o.   MRN: [id number]  HPI: Ms.Keeli Deana Leavy is a 35 y.o. woman with PMHx detailed below first seen by our clinic for seizure on 2/23 after a hospitalization, now here for follow up. She does not recall any recurrent seizures while she is taking Keppra twice daily and Trileptal daily. However she continues to have occasional headaches and thinks she wakes up feeling very sleepy with blue looking lips and nail beds. She is established with outpatient neurology and just completed a 3 day continuous EEG for further workup.  See problem based assessment and plan below for additional details.  Past Medical History  Diagnosis Date  . Epilepsy ([place])   . Asthma   . Stroke ([place])   . Seizures ([place])     last seizure 07-2015  . Stomach ulcer   . Fibroid tumor   . Anxiety   . Depression   . Migraine    Current Outpatient Prescriptions  Medication Sig Dispense Refill  . albuterol (PROVENTIL HFA;VENTOLIN HFA) 108 (90 Base) MCG/ACT inhaler Inhale 2 puffs into the lungs every 4 (four) hours as needed for wheezing or shortness of breath. 1 Inhaler 2  . ARIPiprazole (ABILIFY) 10 MG tablet Take 1 tablet (10 mg total) by mouth daily. For mood control 30 tablet 0  . bismuth subsalicylate (PEPTO-BISMOL) 262 MG/15ML suspension Take 30 mLs by mouth every 6 (six) hours as needed. 360 mL 0  . cetirizine (ZYRTEC) 10 MG tablet Take 1 tablet (10 mg total) by mouth daily as needed for allergies. 30 tablet 1  . fluticasone furoate-vilanterol (BREO ELLIPTA) 200-25 MCG/INH AEPB Inhale 1 puff into the lungs daily after breakfast. For Asthma 28 each 2  . hydrOXYzine (ATARAX/VISTARIL) 25 MG tablet Take 1 tablet (25 mg total) by mouth every 6 (six) hours as needed for anxiety. 45 tablet 0  . levETIRAcetam (KEPPRA) 1000 MG tablet Take 1 tablet (1,000 mg total) by mouth 2 (two) times daily. For seizures 60 tablet 1  . omeprazole  (PRILOSEC) 40 MG capsule Take 1 capsule (40 mg total) by mouth daily. 30 capsule 5  . OXcarbazepine (TRILEPTAL) 150 MG tablet Take 1 tablet (150 mg total) by mouth 2 (two) times daily. For mood stabilization 60 tablet 0  . sertraline (ZOLOFT) 50 MG tablet Take 1 tablet (50 mg total) by mouth at bedtime. For depression 30 tablet 5   No current facility-administered medications for this visit.   Family History  Problem Relation Age of Onset  . Heart failure Mother   . Cancer Father   . Seizures Father    Social History   Social History  . Marital Status: Single    Spouse Name: N/A  . Number of Children: 0  . Years of Education: 11   Occupational History  . [employer]    Social History Main Topics  . Smoking status: Current Every Day Smoker -- 0.50 packs/day    Types: Cigarettes  . Smokeless tobacco: None     Comment: BLACK MILD  . Alcohol Use: 0.6 oz/week    1 Cans of beer per week     Comment: occ  . Drug Use: No  . Sexual Activity: Yes    Birth Control/ Protection: None   Other Topics Concern  . None   Social History Narrative   Lives with [facility]   Caffeine use:  daily   ** Merged History Encounter **  Review of Systems: Review of Systems  Constitutional: Positive for weight loss.  Eyes: Negative for blurred vision.  Respiratory: Negative for shortness of breath.   Cardiovascular: Negative for chest pain.  Gastrointestinal: Positive for nausea, vomiting and abdominal pain. Negative for diarrhea, constipation and blood in stool.  Musculoskeletal: Negative for falls.  Skin: Negative for rash.  Neurological: Positive for seizures and headaches.    Objective:  Physical Exam: Filed Vitals:   12/20/15 1548  BP: 109/66  Pulse: 75  Temp: 98 F (36.7 C)  TempSrc: Oral  Height: 5\' 4"  (1.626 m)  Weight: 136 lb 4.8 oz (61.825 kg)  SpO2: 100%   GENERAL- alert, co-operative, NAD HEENT- Atraumatic, oral mucosa appears moist CARDIAC- RRR, no  murmurs, rubs or gallops. RESP- CTAB, no wheezes or crackles. ABDOMEN- Tenderness to palpation over upper quadrants, normoactive bowel sounds, small scars consistent with past laparoscopy NEURO- No obvious Cr N abnormality, strength upper and lower extremities- 5/5, Sensation intact globally EXTREMITIES- symmetric, no pedal edema SKIN- Warm, dry, No rash or lesion PSYCH- Normal mood and affect, appropriate thought content and speech  Assessment & Plan:

## 2015-12-20 NOTE — Patient Instructions (Signed)
Today we refilled your medications and discussed several medical problems. Your stomach pain and vomiting is unlikely to be related to acid reflux so I recommend discontinuing Zantac at the least. You should follow up with your Neurologist and at Behavioral health later this month.  Please call your insurance to discuss coverage as I think the MOST appropriate plan would be to get a swallowing study and/or endoscopy to evaluate your pain and vomiting. However If only endoscopy is covered we could proceed with that as the plan. Alternatively you can get these procedures done directly but I want to minimize the financial burden of ongoing medical examinations.  Please follow up with our clinic within the next 6-8 weeks or so as we will have a lot more information to work with by that time.

## 2015-12-21 ENCOUNTER — Other Ambulatory Visit: Payer: Self-pay

## 2015-12-21 NOTE — Assessment & Plan Note (Signed)
Assessment: Patient seen with extensive history and symptom review at Neurology visit on 3/24. Seizures apparently controlled on Keppra 1000mg  BID based on previous EEG and subsequent ambulatory EEG has just been completed. There is concern for an overlap of the patient's significant psychiatric history and pseudoseizure presentations previously versus epilepsy causing true seizure events. The patient also reported a different pattern of taking trileptal compared to the prescription but this was not review with her during the encounter today.  Plan: Continued follow up with outpatient neurology Refill antiepileptics Keppra, trileptal Control of underlying mood disorder- continue zoloft 50mg  qhs

## 2015-12-21 NOTE — Assessment & Plan Note (Addendum)
Assessment: She feels her mood is irritable but not frankly depressed at this time. She has been off of abilify for a while but continues taking zoloft. She has an appointment with behavioral health in about 2 weeks. PHQ2 1 today down from PHQ9 of 19 at previous appointmetn suggesting actually a bit better. Depression may also be contributing to her decreased appetite and weight loss.  Plan: Continue just zoloft 50mg  until F/U at behavioral health this month Continue depression screening assessments for subsequent encounter

## 2015-12-21 NOTE — Assessment & Plan Note (Signed)
Symptoms are mild. She has fairly minimal rhinitis, eye irritation, or sinus congestion. She is taking zyrtec daily but also has start using albuterol inhaler about weekly again due to intermittent SOB but it is not really clear these are precipitated by an inflammatory process. Her SOB may be more linked to anxiety since SOB without significant congestion and/or cough from an allergic process is very irregular.  Refilled albuterol inhaler prescription

## 2015-12-21 NOTE — Assessment & Plan Note (Addendum)
Assessment: She has been suffering with odynophagia, abdominal pain when eating, and sometimes vomiting during or shortly after meals. She reports taking omeprazole consistently without a major change in her symptoms. She has had an about 8 pound unintentional weight loss since last year that she attributes to lack of appetite and abdominal pain. She underwent barium swallowing study last year while being evaluated for a hernia (hiatal?) that was surgically repaired. From her symptoms and history concern for a gastritis/esophagitis or other esophageal motility disorder. With a history of previous surgery there is a possibility of adhesion or stricture as well.  Plan: Continue omeprazole daily Discontinue Zantac BEST workup would be swallow study and if appropriate endoscopy for thorough workup of her symptoms, but I think endoscopy will be essential in any case given her constellation of symptoms. Because of limited insurance access she will call about coverage prior to final decision.

## 2015-12-24 NOTE — Progress Notes (Signed)
Received physician status notification from [facility] that the study is complete and has been loaded onto their server. They will send notification within 48 hours to let us know report generated and ready to view. Dated: 12/21/15.

## 2015-12-24 NOTE — Progress Notes (Signed)
Case discussed with Dr. Rice at the time of the visit.  We reviewed the resident's history and exam and pertinent patient test results.  I agree with the assessment, diagnosis, and plan of care documented in the resident's note. 

## 2015-12-26 ENCOUNTER — Telehealth: Payer: Self-pay | Admitting: *Deleted

## 2015-12-26 NOTE — Telephone Encounter (Signed)
RETURNED CALL TO PATIENT. STATES THAT HER INSURANCE WILL COVER HER HAVING  A" SCOPE" DONE.  WILL SEND MESSAGE TO DR. RICE.

## 2015-12-28 ENCOUNTER — Other Ambulatory Visit: Payer: Self-pay | Admitting: Internal Medicine

## 2015-12-28 DIAGNOSIS — K2970 Gastritis, unspecified, without bleeding: Secondary | ICD-10-CM

## 2015-12-31 ENCOUNTER — Other Ambulatory Visit: Payer: Self-pay

## 2015-12-31 ENCOUNTER — Telehealth: Payer: Self-pay | Admitting: Neurology

## 2015-12-31 MED ORDER — LEVETIRACETAM 1000 MG PO TABS: ORAL_TABLET | ORAL | Status: DC

## 2015-12-31 NOTE — Telephone Encounter (Signed)
I have called patient for abnormal EEG, 72 hour ambulatory EEG recorded from March third to November 19 2015  There was reported to you electrode graphic seizure event one lasting 20 seconds, the second one lasted about 40 seconds,   patient reported she had one seizure 2 days ago December 29 2015, she woke up was confused, was told by her friend who had witnessed the episode that she had a seizure,  She is now taking Keppra 1000 mg twice a day, Trileptal 150 mg twice a day, I have advised her increase Keppra to 1000 mg in the morning, 2000 mg at night, a new prescription was called in,

## 2016-01-01 NOTE — Telephone Encounter (Signed)
Thank you :)

## 2016-01-03 ENCOUNTER — Telehealth (HOSPITAL_COMMUNITY): Payer: Self-pay

## 2016-01-03 ENCOUNTER — Ambulatory Visit (INDEPENDENT_AMBULATORY_CARE_PROVIDER_SITE_OTHER): Payer: PRIVATE HEALTH INSURANCE | Admitting: Clinical

## 2016-01-03 ENCOUNTER — Encounter (HOSPITAL_COMMUNITY): Payer: Self-pay | Admitting: *Deleted

## 2016-01-03 ENCOUNTER — Encounter (INDEPENDENT_AMBULATORY_CARE_PROVIDER_SITE_OTHER): Payer: Self-pay

## 2016-01-03 ENCOUNTER — Encounter (HOSPITAL_COMMUNITY): Payer: Self-pay | Admitting: Clinical

## 2016-01-03 ENCOUNTER — Emergency Department (HOSPITAL_COMMUNITY)
Admission: EM | Admit: 2016-01-03 | Discharge: 2016-01-03 | Disposition: A | Discharge status: Home / Self Care | Payer: PRIVATE HEALTH INSURANCE | Attending: Emergency Medicine | Admitting: Emergency Medicine

## 2016-01-03 DIAGNOSIS — Z8719 Personal history of other diseases of the digestive system: Secondary | ICD-10-CM | POA: Insufficient documentation

## 2016-01-03 DIAGNOSIS — Z86018 Personal history of other benign neoplasm: Secondary | ICD-10-CM | POA: Insufficient documentation

## 2016-01-03 DIAGNOSIS — F1310 Sedative, hypnotic or anxiolytic abuse, uncomplicated: Secondary | ICD-10-CM | POA: Insufficient documentation

## 2016-01-03 DIAGNOSIS — Z7951 Long term (current) use of inhaled steroids: Secondary | ICD-10-CM | POA: Insufficient documentation

## 2016-01-03 DIAGNOSIS — F909 Attention-deficit hyperactivity disorder, unspecified type: Secondary | ICD-10-CM

## 2016-01-03 DIAGNOSIS — F329 Major depressive disorder, single episode, unspecified: Secondary | ICD-10-CM | POA: Insufficient documentation

## 2016-01-03 DIAGNOSIS — Z79899 Other long term (current) drug therapy: Secondary | ICD-10-CM | POA: Insufficient documentation

## 2016-01-03 DIAGNOSIS — Z8679 Personal history of other diseases of the circulatory system: Secondary | ICD-10-CM | POA: Insufficient documentation

## 2016-01-03 DIAGNOSIS — Z880 Allergy status to penicillin: Secondary | ICD-10-CM | POA: Insufficient documentation

## 2016-01-03 DIAGNOSIS — G40909 Epilepsy, unspecified, not intractable, without status epilepticus: Secondary | ICD-10-CM

## 2016-01-03 DIAGNOSIS — F4310 Post-traumatic stress disorder, unspecified: Secondary | ICD-10-CM

## 2016-01-03 DIAGNOSIS — J45909 Unspecified asthma, uncomplicated: Secondary | ICD-10-CM | POA: Insufficient documentation

## 2016-01-03 DIAGNOSIS — Z8673 Personal history of transient ischemic attack (TIA), and cerebral infarction without residual deficits: Secondary | ICD-10-CM | POA: Insufficient documentation

## 2016-01-03 DIAGNOSIS — F331 Major depressive disorder, recurrent, moderate: Secondary | ICD-10-CM

## 2016-01-03 DIAGNOSIS — F17210 Nicotine dependence, cigarettes, uncomplicated: Secondary | ICD-10-CM | POA: Insufficient documentation

## 2016-01-03 DIAGNOSIS — F419 Anxiety disorder, unspecified: Secondary | ICD-10-CM | POA: Insufficient documentation

## 2016-01-03 LAB — RAPID URINE DRUG SCREEN, HOSP PERFORMED
Amphetamines: NOT DETECTED
BARBITURATES: POSITIVE — AB
Benzodiazepines: NOT DETECTED
COCAINE: NOT DETECTED
Opiates: NOT DETECTED
TETRAHYDROCANNABINOL: NOT DETECTED

## 2016-01-03 LAB — I-STAT CHEM 8, ED
BUN: 12 mg/dL (ref 6–20)
CALCIUM ION: 1.12 mmol/L (ref 1.12–1.23)
CREATININE: 0.8 mg/dL (ref 0.44–1.00)
Chloride: 106 mmol/L (ref 101–111)
GLUCOSE: 75 mg/dL (ref 65–99)
HEMATOCRIT: 44 % (ref 36.0–46.0)
HEMOGLOBIN: 15 g/dL (ref 12.0–15.0)
Potassium: 3.7 mmol/L (ref 3.5–5.1)
Sodium: 142 mmol/L (ref 135–145)
TCO2: 23 mmol/L (ref 0–100)

## 2016-01-03 LAB — ETHANOL: Alcohol, Ethyl (B): <5 mg/dL (ref <5)

## 2016-01-03 MED ORDER — LORAZEPAM 2 MG/ML IJ SOLN
INTRAMUSCULAR | Status: AC
  Filled 2016-01-03: qty 1

## 2016-01-03 MED ORDER — OXCARBAZEPINE 300 MG PO TABS
300.0000 mg | ORAL_TABLET | Freq: Once | ORAL | Status: AC
  Administered 2016-01-03: 300 mg via ORAL
  Filled 2016-01-03: qty 1

## 2016-01-03 NOTE — ED Provider Notes (Signed)
CSN: [id number]     Arrival date & time 01/03/16  1701 History   First MD Initiated Contact with Patient 01/03/16 1719     Chief Complaint  Patient presents with  . Seizures     (Consider location/radiation/quality/duration/timing/severity/associated sxs/prior Treatment) Patient is a 35 y.o. female presenting with seizures. The history is provided by the patient.  Seizures Seizure activity on arrival: no   Seizure type:  Grand mal Preceding symptoms: hyperventilation and panic   Initial focality:  None Episode characteristics: generalized shaking   Episode characteristics: no incontinence   Postictal symptoms: memory loss   Return to baseline: yes   Severity:  Moderate Duration:  12 hours Timing:  Clustered Number of seizures this episode:  10 Progression:  Resolved Context: stress   Context comment:  History of seizures and pseudoseizures Recent head injury:  No recent head injuries PTA treatment:  None History of seizures: yes     Past Medical History  Diagnosis Date  . Epilepsy ([place])   . Asthma   . Stroke ([place])   . Seizures ([place])     last seizure 07-2015  . Stomach ulcer   . Fibroid tumor   . Anxiety   . Depression   . Migraine    Past Surgical History  Procedure Laterality Date  . Umbilical hernia repair  12/2014   Family History  Problem Relation Age of Onset  . Heart failure Mother   . Cancer Father   . Seizures Father   . Drug abuse Sister   . Alcohol abuse Sister   . Alcohol abuse Sister   . Drug abuse Sister    Social History  Substance Use Topics  . Smoking status: Current Every Day Smoker -- 0.25 packs/day for 0 years    Types: Cigars  . Smokeless tobacco: None     Comment: BLACK MILD  . Alcohol Use: 0.6 oz/week    1 Cans of beer per week     Comment: occ   OB History    Gravida Para Term Preterm AB TAB SAB Ectopic Multiple Living   0 0 0 0 0 0 0 0       Review of Systems  Neurological: Positive for seizures.  All other systems  reviewed and are negative.     Allergies  Bee venom; Chocolate; Mosquito (diagnostic); Orange fruit; Oregano; Penicillins; Dilantin; Garlic; Tramadol; and Tape  Home Medications   Prior to Admission medications   Medication Sig Start Date End Date Taking? Authorizing Provider  albuterol (PROVENTIL HFA;VENTOLIN HFA) 108 (90 Base) MCG/ACT inhaler Inhale 2 puffs into the lungs every 4 (four) hours as needed for wheezing or shortness of breath. 12/20/15  Yes [name], MD  fluticasone furoate-vilanterol (BREO ELLIPTA) 200-25 MCG/INH AEPB Inhale 1 puff into the lungs daily after breakfast. For Asthma 12/20/15  Yes [name], MD  levETIRAcetam (KEPPRA) 1000 MG tablet One po qam and 2 tab po qhs 12/31/15  Yes Marcial Pacas, MD  omeprazole (PRILOSEC) 40 MG capsule Take 1 capsule (40 mg total) by mouth daily. 11/08/15  Yes Corky Sox, MD  OXcarbazepine (TRILEPTAL) 150 MG tablet Take 1 tablet (150 mg total) by mouth 2 (two) times daily. For mood stabilization 09/12/15  Yes Encarnacion Slates, NP  sertraline (ZOLOFT) 50 MG tablet Take 1 tablet (50 mg total) by mouth at bedtime. For depression 11/08/15  Yes Corky Sox, MD  ARIPiprazole (ABILIFY) 10 MG tablet Take 1 tablet (10 mg total) by  mouth daily. For mood control Patient not taking: Reported on 01/03/2016 09/12/15   Encarnacion Slates, NP  bismuth subsalicylate (PEPTO-BISMOL) 262 MG/15ML suspension Take 30 mLs by mouth every 6 (six) hours as needed. 09/21/15   Leo Grosser, MD  cetirizine (ZYRTEC) 10 MG tablet Take 1 tablet (10 mg total) by mouth daily as needed for allergies. 11/08/15   Corky Sox, MD  hydrOXYzine (ATARAX/VISTARIL) 25 MG tablet Take 1 tablet (25 mg total) by mouth every 6 (six) hours as needed for anxiety. Patient not taking: Reported on 01/03/2016 09/12/15   Encarnacion Slates, NP   BP 114/75 mmHg  Pulse 68  Temp(Src) 98.5 F (36.9 C) (Oral)  Resp 20  SpO2 100%  LMP 12/11/2015 Physical Exam  Constitutional: She is oriented to  person, place, and time. She appears well-developed and well-nourished. No distress.  HENT:  Head: Normocephalic.  Eyes: Conjunctivae are normal.  Neck: Neck supple. No tracheal deviation present.  Cardiovascular: Normal rate, regular rhythm and normal heart sounds.   Pulmonary/Chest: Effort normal and breath sounds normal. No respiratory distress.  Abdominal: Soft. She exhibits no distension.  Neurological: She is alert and oriented to person, place, and time. She has normal strength. No cranial nerve deficit or sensory deficit. Coordination normal. GCS eye subscore is 4. GCS verbal subscore is 5. GCS motor subscore is 6.  Skin: Skin is warm and dry.  Psychiatric: She has a normal mood and affect.    ED Course  Procedures (including critical care time) Labs Review Labs Reviewed  URINE RAPID DRUG SCREEN, HOSP PERFORMED - Abnormal; Notable for the following:    Barbiturates POSITIVE (*)    All other components within normal limits  ETHANOL  I-STAT CHEM 8, ED    Imaging Review No results found. I have personally reviewed and evaluated these images and lab results as part of my medical decision-making.   EKG Interpretation None      MDM   Final diagnoses:  Seizure disorder [facility])   35 y.o. female presents with recurrent shaking episodes. Took her keppra today but not her trileptal. Had an episode in the ED witnessed by nursing but when I came to bedside Pt not-post-ictal, easily arousable, can tell me about her medication intake. I suspect with increased emotional stress this could represent pseudoseizures, given home dose of trileptal and no recurrence. Plan to follow up with PCP as needed and return precautions discussed for worsening or new concerning symptoms.     Leo Grosser, MD 01/04/16 [id number]

## 2016-01-03 NOTE — Telephone Encounter (Signed)
Met with patient with Tina [name], therapist to discuss patient's status with missed initial evaluation with Dr. Doyne Keel on 10/25/15 after was discharged from inpatient on 09/10/15.  Patient reported needing a refill of Abilify as is currently out and would like to reschedule missed initial evaluation.  Spoke with Agustina Caroli, NP from inpatient who agreed to call in a one time refill of patient's Abilify 10 mg for 30 days to [facility] on [address].  Assisted patient with rescheduling first available initial evaluation with Dr. Salem Senate set for 01/09/16 and informed patient of need to keep this appointment for any further refills of medication as patient stated understanding and agreement with plan.  Patient to call back if any problem with called in order.

## 2016-01-03 NOTE — ED Notes (Signed)
Bed: WA04 Expected date:  Expected time:  Means of arrival:  Comments: PCP: Hgb 6.4 - coming via car

## 2016-01-03 NOTE — Telephone Encounter (Signed)
Patient here today to see therapist Tharon Aquas, she is going to set up an appointment with Psychiatrist today, she needs a refill on her medication - Abilify - that she received when she left the hospital in Dec. Please review and advise. Patient call back number is [phone]

## 2016-01-03 NOTE — Discharge Instructions (Signed)
Epilepsy °People with epilepsy have times when they shake and jerk uncontrollably (seizures). This happens when there is a sudden change in brain function. Epilepsy may have many possible causes. Anything that disturbs the normal pattern of brain cell activity can lead to seizures. °HOME CARE  °· Follow your doctor's instructions about driving and safety during normal activities. °· Get enough sleep. °· Only take medicine as told by your doctor. °· Avoid things that you know can cause you to have seizures (triggers). °· Write down when your seizures happen and what you remember about each seizure. Write down anything you think may have caused the seizure to happen. °· Tell the people you live and work with that you have seizures. Make sure they know how to help you. They should: °¨ Cushion your head and body. °¨ Turn you on your side. °¨ Not restrain you. °¨ Not place anything inside your mouth. °¨ Call for local emergency medical help if there is any question about what has happened. °· Keep all follow-up visits with your doctor. This is very important. °GET HELP IF: °· You get an infection or start to feel sick. You may have more seizures when you are sick. °· You are having seizures more often. °· Your seizure pattern is changing. °GET HELP RIGHT AWAY IF:  °· A seizure does not stop after a few seconds or minutes. °· A seizure causes you to have trouble breathing. °· A seizure gives you a very bad headache. °· A seizure makes you unable to speak or use a part of your body. °  °This information is not intended to replace advice given to you by your health care provider. Make sure you discuss any questions you have with your health care provider. °  °Document Released: 06/29/2009 Document Revised: 06/22/2013 Document Reviewed: 04/13/2013 °Elsevier Interactive Patient Education ©2016 [employer] ° °

## 2016-01-03 NOTE — ED Notes (Signed)
Per EMS - patient had 3 witnessed seizures for approximately 2 minutes each while at home.  Patient seized on bed and rolled on to floor.  Bystander at patient's home report patient has been stressed out today, but otherwise well.  Patient has hx of seizure disorder and takes Keppra for control.  Bystander thinks patient takes her meds on a regular basis.  Patient was post-tictal on scene.  No trauma noted to patient, no incontinence.  Vitals WNL.

## 2016-01-07 ENCOUNTER — Ambulatory Visit
Admission: RE | Admit: 2016-01-07 | Discharge: 2016-01-07 | Disposition: A | Discharge status: Home / Self Care | Payer: PRIVATE HEALTH INSURANCE | Source: Ambulatory Visit | Attending: Neurology | Admitting: Neurology

## 2016-01-07 DIAGNOSIS — G40309 Generalized idiopathic epilepsy and epileptic syndromes, not intractable, without status epilepticus: Secondary | ICD-10-CM | POA: Diagnosis not present

## 2016-01-07 MED ORDER — GADOBENATE DIMEGLUMINE 529 MG/ML IV SOLN
13.0000 mL | Freq: Once | INTRAVENOUS | Status: AC | PRN
  Administered 2016-01-07: 13 mL via INTRAVENOUS

## 2016-01-09 ENCOUNTER — Encounter: Payer: Self-pay | Admitting: Gastroenterology

## 2016-01-09 ENCOUNTER — Ambulatory Visit (HOSPITAL_COMMUNITY): Payer: Self-pay | Admitting: Psychiatry

## 2016-01-09 ENCOUNTER — Ambulatory Visit (INDEPENDENT_AMBULATORY_CARE_PROVIDER_SITE_OTHER): Payer: PRIVATE HEALTH INSURANCE | Admitting: Gastroenterology

## 2016-01-09 ENCOUNTER — Other Ambulatory Visit: Payer: Self-pay | Admitting: Internal Medicine

## 2016-01-09 VITALS — BP 104/72 | HR 64 | Ht 64.0 in | Wt 123.8 lb

## 2016-01-09 DIAGNOSIS — R1013 Epigastric pain: Secondary | ICD-10-CM | POA: Diagnosis not present

## 2016-01-09 DIAGNOSIS — R634 Abnormal weight loss: Secondary | ICD-10-CM

## 2016-01-09 DIAGNOSIS — R112 Nausea with vomiting, unspecified: Secondary | ICD-10-CM

## 2016-01-09 NOTE — Progress Notes (Signed)
Tina [name]    [id number]    1981-01-02  Primary Care Physician:Chris Cassie Freer, MD  Referring Physician: [name], MD [place], [place] [address]  Chief complaint: loss of appetite  HPI: 8 yr F with h/o ?seizure disorder here for new patient evaluation with complaints of loss of appetite and weight loss, per patient she lost a significant amount of weight and has gone down 163-->120 lb since February. She complains of having nausea associated with vomiting 1-2 times a day, mostly brings up mucus. Denies vomiting any undigested food. She is having bowel movements once in every 2-3 days, reports them to be loose. She also complains of difficulty swallowing occasionally. She is currently on Keppra and Trileptal for seizures. EEG  was essentially normal. She also had CT head and brain MRI which were normal. Patient reported having antireflux surgery in [place] 6 months ago, called the practice that she mentioned and they do not have any information about the patient Denies any substance abuse or ETOH.    Outpatient Encounter Prescriptions as of 01/09/2016  Medication Sig  . albuterol (PROVENTIL HFA;VENTOLIN HFA) 108 (90 Base) MCG/ACT inhaler Inhale 2 puffs into the lungs every 4 (four) hours as needed for wheezing or shortness of breath.  . ARIPiprazole (ABILIFY) 10 MG tablet Take 1 tablet (10 mg total) by mouth daily. For mood control (Patient not taking: Reported on 01/03/2016)  . bismuth subsalicylate (PEPTO-BISMOL) 262 MG/15ML suspension Take 30 mLs by mouth every 6 (six) hours as needed.  . cetirizine (ZYRTEC) 10 MG tablet Take 1 tablet (10 mg total) by mouth daily as needed for allergies.  . fluticasone furoate-vilanterol (BREO ELLIPTA) 200-25 MCG/INH AEPB Inhale 1 puff into the lungs daily after breakfast. For Asthma  . hydrOXYzine (ATARAX/VISTARIL) 25 MG tablet Take 1 tablet (25 mg total) by mouth every 6 (six) hours as  needed for anxiety. (Patient not taking: Reported on 01/03/2016)  . levETIRAcetam (KEPPRA) 1000 MG tablet One po qam and 2 tab po qhs  . omeprazole (PRILOSEC) 40 MG capsule Take 1 capsule (40 mg total) by mouth daily.  . OXcarbazepine (TRILEPTAL) 150 MG tablet Take 1 tablet (150 mg total) by mouth 2 (two) times daily. For mood stabilization  . sertraline (ZOLOFT) 50 MG tablet Take 1 tablet (50 mg total) by mouth at bedtime. For depression   No facility-administered encounter medications on file as of 01/09/2016.    Allergies as of 01/09/2016 - Review Complete 01/03/2016  Allergen Reaction Noted  . Bee venom Anaphylaxis and Swelling 07/14/2015  . Chocolate Anaphylaxis 07/14/2015  . Mosquito (diagnostic) Anaphylaxis and Swelling 07/14/2015  . Orange fruit [citrus] Swelling and Other (See Comments) 07/14/2015  . Oregano [origanum oil] Swelling 07/14/2015  . Penicillins Hives, Shortness Of Breath, and Other (See Comments) 07/14/2015  . Dilantin [phenytoin sodium extended] Itching and Swelling 09/11/2015  . Garlic Itching and Other (See Comments) 07/14/2015  . Tramadol Itching and Swelling 09/11/2015  . Tape Rash 07/30/2015    Past Medical History  Diagnosis Date  . Epilepsy ([place])   . Asthma   . Stroke ([place])   . Seizures ([place])     last seizure 07-2015  . Stomach ulcer   . Fibroid tumor   . Anxiety   . Depression   . Migraine     Past Surgical History  Procedure Laterality Date  . Umbilical hernia repair  12/2014  Family History  Problem Relation Age of Onset  . Heart failure Mother   . Cancer Father   . Seizures Father   . Drug abuse Sister   . Alcohol abuse Sister   . Alcohol abuse Sister   . Drug abuse Sister     Social History   Social History  . Marital Status: Single    Spouse Name: N/A  . Number of Children: 0  . Years of Education: 11   Occupational History  . [employer]    Social History Main Topics  . Smoking status: Current Every Day Smoker -- 0.25  packs/day for 0 years    Types: Cigars  . Smokeless tobacco: Not on file     Comment: BLACK MILD  . Alcohol Use: 0.6 oz/week    1 Cans of beer per week     Comment: occ  . Drug Use: No  . Sexual Activity: Yes    Birth Control/ Protection: None   Other Topics Concern  . Not on file   Social History Narrative   Lives with [facility]   Caffeine use:  daily   ** Merged History Encounter **          Review of systems: Review of Systems  Constitutional: Negative for fever and chills.  HENT: Negative.   Eyes: Negative for blurred vision.  Respiratory: Negative for cough, shortness of breath and wheezing.   Cardiovascular: Negative for chest pain and palpitations.  Gastrointestinal: as per HPI Genitourinary: Negative for dysuria, urgency, frequency and hematuria.  Musculoskeletal: Negative for myalgias, back pain and joint pain.  Skin: Negative for itching and rash.  Neurological: Negative for dizziness, tremors, focal weakness, seizures and loss of consciousness.  Endo/Heme/Allergies: Negative for environmental allergies.  Psychiatric/Behavioral: Negative for depression, suicidal ideas and hallucinations.  All other systems reviewed and are negative.   Physical Exam: Filed Vitals:   01/09/16 1443  BP: 104/72  Pulse: 64   Gen:      No acute distress HEENT:  EOMI, sclera anicteric Neck:     No masses; no thyromegaly Lungs:    Clear to auscultation bilaterally; normal respiratory effort CV:         Regular rate and rhythm; no murmurs Abd:      + bowel sounds; soft, non-tender; no palpable masses, no distension Ext:    No edema; adequate peripheral perfusion Skin:      Warm and dry; no rash Neuro: alert and oriented x 3 Psych: normal mood and affect  Data Reviewed:  Reviewed chart in epic   Assessment and Plan/Recommendations:  35 year old female here with complaints of loss of appetite and weight loss . On review of her weight in epic she weighed 140 pounds  in February the highest it's been and is currently down to 123 pounds.  We'll try to obtain records of her prior antireflux surgery, no information available at this time Obtain upper GI series to evaluate possible dysphagia Abdominal ultrasound to evaluate for possible gallbladder disease Continue omeprazole daily  K. Denzil Magnuson , MD [phone] Mon-Fri 8a-5p [phone] after 5p, weekends, holidays

## 2016-01-09 NOTE — Patient Instructions (Signed)
You have been scheduled for an abdominal ultrasound at [facility] check in at admitting office  (1st floor of hospital) on 02/10/2016 at 8:45am. Please arrive 15 minutes prior to your appointment for registration. Make certain not to have anything to eat or drink 6 hours prior to your appointment. Should you need to reschedule your appointment, please contact radiology at [phone]. This test typically takes about 30 minutes to perform.  You have been scheduled for an Upper GI Series at [facility]  02/10/2016. Your appointment is on 5/28/2017at 8:45am . Please arrive 15 minutes prior to your test for registration. Make sure not to eat or drink anything after midnight on the night before your test. If you need to reschedule, please call radiology at [phone]. ________________________________________________________________ An upper GI series uses x rays to help diagnose problems of the upper GI tract, which includes the esophagus, stomach, and duodenum. The duodenum is the first part of the small intestine. An upper GI series is conducted by a radiology technologist or a radiologist-a doctor who specializes in x-ray imaging-at a hospital or outpatient center. While sitting or standing in front of an x-ray machine, the patient drinks barium liquid, which is often white and has a chalky consistency and taste. The barium liquid coats the lining of the upper GI tract and makes signs of disease show up more clearly on x rays. X-ray video, called fluoroscopy, is used to view the barium liquid moving through the esophagus, stomach, and duodenum. Additional x rays and fluoroscopy are performed while the patient lies on an x-ray table. To fully coat the upper GI tract with barium liquid, the technologist or radiologist may press on the abdomen or ask the patient to change position. Patients hold still in various positions, allowing the technologist or radiologist to take x rays of the upper GI  tract at different angles. If a technologist conducts the upper GI series, a radiologist will later examine the images to look for problems.  This test typically takes about 1 hour to complete. __________________________________________________________________  Based on imaging studies results we may schedule you for an upper endoscopy

## 2016-01-09 NOTE — Telephone Encounter (Signed)
Refill request was denied as rx has already been filled by other means by another office.Despina Hidden Cassady4/26/20178:46 AM

## 2016-01-10 ENCOUNTER — Telehealth: Payer: Self-pay | Admitting: *Deleted

## 2016-01-10 ENCOUNTER — Ambulatory Visit (HOSPITAL_COMMUNITY): Payer: Self-pay | Admitting: Psychiatry

## 2016-01-10 NOTE — Telephone Encounter (Signed)
Called pt about normal MRI brain results per Dr Jaynee Eagles note. Pt verbalized understanding. Advised her to keep f/u for 6/21 at 300pm, check in 230pm.
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
## 2016-01-10 NOTE — Telephone Encounter (Signed)
-----   Message from Melvenia Beam, MD sent at 01/09/2016  9:03 PM EDT ----- MRI of the brain normal thanks

## 2016-01-10 NOTE — Progress Notes (Deleted)
   THERAPIST PROGRESS NOTE  Session Time: ***  Participation Level: {BHH PARTICIPATION LEVEL:22264}  Behavioral Response: {Appearance:22683}{BHH LEVEL OF CONSCIOUSNESS:22305}{BHH MOOD:22306}  Type of Therapy: {CHL AMB BH Type of Therapy:21022741}  Treatment Goals addressed: {CHL AMB BH Treatment Goals Addressed:21022754}  Interventions: {CHL AMB BH Type of Intervention:21022753}  Summary: Tina [name] is [name] 35 y.o. female who presents with ***.   Suicidal/Homicidal: {BHH YES OR NO:22294}{yes/no/with/without intent/plan:22693}  Therapist Response: ***  Plan: Return again in *** weeks.  Diagnosis: Axis I: {psych axis 1:31909}    Axis II: {psych axis 2:31910}    Tina Mcdill A, LCSW 01/10/2016

## 2016-01-11 ENCOUNTER — Ambulatory Visit (HOSPITAL_COMMUNITY)
Admission: RE | Admit: 2016-01-11 | Discharge: 2016-01-11 | Disposition: A | Discharge status: Home / Self Care | Payer: Self-pay | Source: Ambulatory Visit | Attending: Gastroenterology | Admitting: Gastroenterology

## 2016-01-11 ENCOUNTER — Other Ambulatory Visit: Payer: Self-pay | Admitting: Gastroenterology

## 2016-01-11 ENCOUNTER — Other Ambulatory Visit (HOSPITAL_COMMUNITY): Payer: Self-pay

## 2016-01-11 DIAGNOSIS — R112 Nausea with vomiting, unspecified: Secondary | ICD-10-CM | POA: Insufficient documentation

## 2016-01-11 DIAGNOSIS — R1013 Epigastric pain: Secondary | ICD-10-CM

## 2016-01-11 DIAGNOSIS — R634 Abnormal weight loss: Secondary | ICD-10-CM

## 2016-01-11 DIAGNOSIS — R932 Abnormal findings on diagnostic imaging of liver and biliary tract: Secondary | ICD-10-CM | POA: Insufficient documentation

## 2016-01-13 ENCOUNTER — Emergency Department (HOSPITAL_COMMUNITY)
Admission: EM | Admit: 2016-01-13 | Discharge: 2016-01-14 | Disposition: A | Discharge status: Home / Self Care | Payer: Self-pay | Attending: Emergency Medicine | Admitting: Emergency Medicine

## 2016-01-13 ENCOUNTER — Encounter (HOSPITAL_COMMUNITY): Payer: Self-pay | Admitting: Emergency Medicine

## 2016-01-13 DIAGNOSIS — R112 Nausea with vomiting, unspecified: Secondary | ICD-10-CM

## 2016-01-13 DIAGNOSIS — R197 Diarrhea, unspecified: Secondary | ICD-10-CM

## 2016-01-13 DIAGNOSIS — G43909 Migraine, unspecified, not intractable, without status migrainosus: Secondary | ICD-10-CM | POA: Insufficient documentation

## 2016-01-13 DIAGNOSIS — Z86018 Personal history of other benign neoplasm: Secondary | ICD-10-CM | POA: Insufficient documentation

## 2016-01-13 DIAGNOSIS — Z880 Allergy status to penicillin: Secondary | ICD-10-CM | POA: Insufficient documentation

## 2016-01-13 DIAGNOSIS — G40909 Epilepsy, unspecified, not intractable, without status epilepticus: Secondary | ICD-10-CM | POA: Insufficient documentation

## 2016-01-13 DIAGNOSIS — Z79899 Other long term (current) drug therapy: Secondary | ICD-10-CM | POA: Insufficient documentation

## 2016-01-13 DIAGNOSIS — Z7951 Long term (current) use of inhaled steroids: Secondary | ICD-10-CM | POA: Insufficient documentation

## 2016-01-13 DIAGNOSIS — J45909 Unspecified asthma, uncomplicated: Secondary | ICD-10-CM | POA: Insufficient documentation

## 2016-01-13 DIAGNOSIS — F419 Anxiety disorder, unspecified: Secondary | ICD-10-CM | POA: Insufficient documentation

## 2016-01-13 DIAGNOSIS — Z8673 Personal history of transient ischemic attack (TIA), and cerebral infarction without residual deficits: Secondary | ICD-10-CM | POA: Insufficient documentation

## 2016-01-13 DIAGNOSIS — K2970 Gastritis, unspecified, without bleeding: Secondary | ICD-10-CM | POA: Insufficient documentation

## 2016-01-13 DIAGNOSIS — F329 Major depressive disorder, single episode, unspecified: Secondary | ICD-10-CM | POA: Insufficient documentation

## 2016-01-13 DIAGNOSIS — F17210 Nicotine dependence, cigarettes, uncomplicated: Secondary | ICD-10-CM | POA: Insufficient documentation

## 2016-01-13 DIAGNOSIS — R1084 Generalized abdominal pain: Secondary | ICD-10-CM

## 2016-01-13 DIAGNOSIS — Z3202 Encounter for pregnancy test, result negative: Secondary | ICD-10-CM | POA: Insufficient documentation

## 2016-01-13 LAB — CBG MONITORING, ED: GLUCOSE-CAPILLARY: 84 mg/dL (ref 65–99)

## 2016-01-13 NOTE — ED Notes (Signed)
Requested patient to urinate. 

## 2016-01-13 NOTE — ED Notes (Signed)
Patient was at work started feeling real weak. Patient has not eating anything today. Patient went in back to lay down at her job. Patient didnt feel better so called EMS.

## 2016-01-13 NOTE — ED Notes (Signed)
Patient is also complaining of chest pain and a headache that she rates at a 9. Patient has not taken any medication for the pain.

## 2016-01-14 ENCOUNTER — Emergency Department (HOSPITAL_COMMUNITY): Payer: Self-pay

## 2016-01-14 LAB — COMPREHENSIVE METABOLIC PANEL
ALBUMIN: 5.2 g/dL — AB (ref 3.5–5.0)
ALT: 15 U/L (ref 14–54)
ANION GAP: 14 (ref 5–15)
AST: 18 U/L (ref 15–41)
Alkaline Phosphatase: 42 U/L (ref 38–126)
BUN: 13 mg/dL (ref 6–20)
CHLORIDE: 104 mmol/L (ref 101–111)
CO2: 23 mmol/L (ref 22–32)
Calcium: 10 mg/dL (ref 8.9–10.3)
Creatinine, Ser: 0.82 mg/dL (ref 0.44–1.00)
GFR calc Af Amer: >60 mL/min (ref >60)
GFR calc non Af Amer: >60 mL/min (ref >60)
GLUCOSE: 87 mg/dL (ref 65–99)
POTASSIUM: 3.4 mmol/L — AB (ref 3.5–5.1)
SODIUM: 141 mmol/L (ref 135–145)
TOTAL PROTEIN: 8.6 g/dL — AB (ref 6.5–8.1)
Total Bilirubin: 1.2 mg/dL (ref 0.3–1.2)

## 2016-01-14 LAB — URINALYSIS, ROUTINE W REFLEX MICROSCOPIC
Glucose, UA: NEGATIVE mg/dL
Hgb urine dipstick: NEGATIVE
Ketones, ur: >80 mg/dL — AB
Leukocytes, UA: NEGATIVE
NITRITE: NEGATIVE
Protein, ur: 30 mg/dL — AB
Specific Gravity, Urine: 1.046 — ABNORMAL HIGH (ref 1.005–1.030)
pH: 6 (ref 5.0–8.0)

## 2016-01-14 LAB — I-STAT CHEM 8, ED
BUN: 12 mg/dL (ref 6–20)
CREATININE: 0.7 mg/dL (ref 0.44–1.00)
Calcium, Ion: 1.17 mmol/L (ref 1.12–1.23)
Chloride: 102 mmol/L (ref 101–111)
Glucose, Bld: 81 mg/dL (ref 65–99)
HEMATOCRIT: 47 % — AB (ref 36.0–46.0)
HEMOGLOBIN: 16 g/dL — AB (ref 12.0–15.0)
POTASSIUM: 3.3 mmol/L — AB (ref 3.5–5.1)
SODIUM: 141 mmol/L (ref 135–145)
TCO2: 22 mmol/L (ref 0–100)

## 2016-01-14 LAB — URINE MICROSCOPIC-ADD ON

## 2016-01-14 LAB — TYPE AND SCREEN
ABO/RH(D): AB POS
ANTIBODY SCREEN: NEGATIVE

## 2016-01-14 LAB — ABO/RH: ABO/RH(D): AB POS

## 2016-01-14 LAB — CBC
HCT: 42 % (ref 36.0–46.0)
HEMOGLOBIN: 14.8 g/dL (ref 12.0–15.0)
MCH: 32 pg (ref 26.0–34.0)
MCHC: 35.2 g/dL (ref 30.0–36.0)
MCV: 90.7 fL (ref 78.0–100.0)
Platelets: 261 10*3/uL (ref 150–400)
RBC: 4.63 MIL/uL (ref 3.87–5.11)
RDW: 12.1 % (ref 11.5–15.5)
WBC: 6.6 10*3/uL (ref 4.0–10.5)

## 2016-01-14 LAB — POC OCCULT BLOOD, ED: FECAL OCCULT BLD: NEGATIVE

## 2016-01-14 LAB — LIPASE, BLOOD: Lipase: 31 U/L (ref 11–51)

## 2016-01-14 LAB — CBG MONITORING, ED: GLUCOSE-CAPILLARY: 84 mg/dL (ref 65–99)

## 2016-01-14 LAB — POC URINE PREG, ED: PREG TEST UR: NEGATIVE

## 2016-01-14 MED ORDER — OXYCODONE-ACETAMINOPHEN 5-325 MG PO TABS
1.0000 | ORAL_TABLET | Freq: Once | ORAL | Status: AC
  Administered 2016-01-14: 1 via ORAL
  Filled 2016-01-14: qty 1

## 2016-01-14 MED ORDER — PANTOPRAZOLE SODIUM 20 MG PO TBEC: 20.0000 mg | DELAYED_RELEASE_TABLET | Freq: Every day | ORAL | Status: DC

## 2016-01-14 MED ORDER — ONDANSETRON HCL 4 MG/2ML IJ SOLN
4.0000 mg | Freq: Once | INTRAMUSCULAR | Status: AC
  Administered 2016-01-14: 4 mg via INTRAVENOUS
  Filled 2016-01-14: qty 2

## 2016-01-14 MED ORDER — MORPHINE SULFATE (PF) 4 MG/ML IV SOLN
4.0000 mg | Freq: Once | INTRAVENOUS | Status: AC
  Administered 2016-01-14: 4 mg via INTRAVENOUS
  Filled 2016-01-14: qty 1

## 2016-01-14 MED ORDER — SODIUM CHLORIDE 0.9 % IV BOLUS (SEPSIS)
500.0000 mL | Freq: Once | INTRAVENOUS | Status: AC
  Administered 2016-01-14: 500 mL via INTRAVENOUS

## 2016-01-14 MED ORDER — PANTOPRAZOLE SODIUM 40 MG IV SOLR
80.0000 mg | Freq: Once | INTRAVENOUS | Status: AC
  Administered 2016-01-14: 80 mg via INTRAVENOUS
  Filled 2016-01-14: qty 80

## 2016-01-14 MED ORDER — ONDANSETRON 4 MG PO TBDP: 4.0000 mg | ORAL_TABLET | Freq: Three times a day (TID) | ORAL | Status: DC | PRN

## 2016-01-14 MED ORDER — HYDROCODONE-ACETAMINOPHEN 5-325 MG PO TABS: 1.0000 | ORAL_TABLET | Freq: Three times a day (TID) | ORAL | Status: DC | PRN

## 2016-01-14 MED ORDER — POTASSIUM CHLORIDE CRYS ER 20 MEQ PO TBCR
20.0000 meq | EXTENDED_RELEASE_TABLET | Freq: Once | ORAL | Status: AC
  Administered 2016-01-14: 20 meq via ORAL
  Filled 2016-01-14: qty 1

## 2016-01-14 NOTE — Discharge Instructions (Signed)
Abdominal Pain, Adult °Many things can cause abdominal pain. Usually, abdominal pain is not caused by a disease and will improve without treatment. It can often be observed and treated at home. Your health care provider will do a physical exam and possibly order blood tests and X-rays to help determine the seriousness of your pain. However, in many cases, more time must pass before a clear cause of the pain can be found. Before that point, your health care provider may not know if you need more testing or further treatment. °HOME CARE INSTRUCTIONS °Monitor your abdominal pain for any changes. The following actions may help to alleviate any discomfort you are experiencing: °· Only take over-the-counter or prescription medicines as directed by your health care provider. °· Do not take laxatives unless directed to do so by your health care provider. °· Try a clear liquid diet (broth, tea, or water) as directed by your health care provider. Slowly move to a bland diet as tolerated. °SEEK MEDICAL CARE IF: °· You have unexplained abdominal pain. °· You have abdominal pain associated with nausea or diarrhea. °· You have pain when you urinate or have a bowel movement. °· You experience abdominal pain that wakes you in the night. °· You have abdominal pain that is worsened or improved by eating food. °· You have abdominal pain that is worsened with eating fatty foods. °· You have a fever. °SEEK IMMEDIATE MEDICAL CARE IF: °· Your pain does not go away within 2 hours. °· You keep throwing up (vomiting). °· Your pain is felt only in portions of the abdomen, such as the right side or the left lower portion of the abdomen. °· You pass bloody or black tarry stools. °MAKE SURE YOU: °· Understand these instructions. °· Will watch your condition. °· Will get help right away if you are not doing well or get worse. °  °This information is not intended to replace advice given to you by your health care provider. Make sure you discuss  any questions you have with your health care provider. °  °Document Released: 06/11/2005 Document Revised: 05/23/2015 Document Reviewed: 05/11/2013 °Elsevier Interactive Patient Education ©2016 [employer] ° °Gastritis, Adult °Gastritis is soreness and swelling (inflammation) of the lining of the stomach. Gastritis can develop as a sudden onset (acute) or long-term (chronic) condition. If gastritis is not treated, it can lead to stomach bleeding and ulcers. °CAUSES  °Gastritis occurs when the stomach lining is weak or damaged. Digestive juices from the stomach then inflame the weakened stomach lining. The stomach lining may be weak or damaged due to viral or bacterial infections. One common bacterial infection is the Helicobacter pylori infection. Gastritis can also result from excessive alcohol consumption, taking certain medicines, or having too much acid in the stomach.  °SYMPTOMS  °In some cases, there are no symptoms. When symptoms are present, they may include: °· Pain or a burning sensation in the upper abdomen. °· Nausea. °· Vomiting. °· An uncomfortable feeling of fullness after eating. °DIAGNOSIS  °Your caregiver may suspect you have gastritis based on your symptoms and a physical exam. To determine the cause of your gastritis, your caregiver may perform the following: °· Blood or stool tests to check for the H pylori bacterium. °· Gastroscopy. A thin, flexible tube (endoscope) is passed down the esophagus and into the stomach. The endoscope has a light and camera on the end. Your caregiver uses the endoscope to view the inside of the stomach. °· Taking a tissue sample (  biopsy) from the stomach to examine under a microscope. TREATMENT  Depending on the cause of your gastritis, medicines may be prescribed. If you have a bacterial infection, such as an H pylori infection, antibiotics may be given. If your gastritis is caused by too much acid in the stomach, H2 blockers or antacids may be given. Your  caregiver may recommend that you stop taking aspirin, ibuprofen, or other nonsteroidal anti-inflammatory drugs (NSAIDs). HOME CARE INSTRUCTIONS  Only take over-the-counter or prescription medicines as directed by your caregiver.  If you were given antibiotic medicines, take them as directed. Finish them even if you start to feel better.  Drink enough fluids to keep your urine clear or pale yellow.  Avoid foods and drinks that make your symptoms worse, such as:  Caffeine or alcoholic drinks.  Chocolate.  Peppermint or mint flavorings.  Garlic and onions.  Spicy foods.  Citrus fruits, such as oranges, lemons, or limes.  Tomato-based foods such as sauce, chili, salsa, and pizza.  Fried and fatty foods.  Eat small, frequent meals instead of large meals. SEEK IMMEDIATE MEDICAL CARE IF:   You have black or dark red stools.  You vomit blood or material that looks like coffee grounds.  You are unable to keep fluids down.  Your abdominal pain gets worse.  You have a fever.  You do not feel better after 1 week.  You have any other questions or concerns. MAKE SURE YOU:  Understand these instructions.  Will watch your condition.  Will get help right away if you are not doing well or get worse.   This information is not intended to replace advice given to you by your health care provider. Make sure you discuss any questions you have with your health care provider.   Document Released: 08/26/2001 Document Revised: 03/02/2012 Document Reviewed: 10/15/2011 Elsevier Interactive Patient Education 2016 [employer]  Nausea and Vomiting Nausea is a sick feeling that often comes before throwing up (vomiting). Vomiting is a reflex where stomach contents come out of your mouth. Vomiting can cause severe loss of body fluids (dehydration). Children and elderly adults can become dehydrated quickly, especially if they also have diarrhea. Nausea and vomiting are symptoms of a condition  or disease. It is important to find the cause of your symptoms. CAUSES   Direct irritation of the stomach lining. This irritation can result from increased acid production (gastroesophageal reflux disease), infection, food poisoning, taking certain medicines (such as nonsteroidal anti-inflammatory drugs), alcohol use, or tobacco use.  Signals from the brain.These signals could be caused by a headache, heat exposure, an inner ear disturbance, increased pressure in the brain from injury, infection, a tumor, or a concussion, pain, emotional stimulus, or metabolic problems.  An obstruction in the gastrointestinal tract (bowel obstruction).  Illnesses such as diabetes, hepatitis, gallbladder problems, appendicitis, kidney problems, cancer, sepsis, atypical symptoms of a heart attack, or eating disorders.  Medical treatments such as chemotherapy and radiation.  Receiving medicine that makes you sleep (general anesthetic) during surgery. DIAGNOSIS Your caregiver may ask for tests to be done if the problems do not improve after a few days. Tests may also be done if symptoms are severe or if the reason for the nausea and vomiting is not clear. Tests may include:  Urine tests.  Blood tests.  Stool tests.  Cultures (to look for evidence of infection).  X-rays or other imaging studies. Test results can help your caregiver make decisions about treatment or the need for additional tests.  TREATMENT You need to stay well hydrated. Drink frequently but in small amounts.You may wish to drink water, sports drinks, clear broth, or eat frozen ice pops or gelatin dessert to help stay hydrated.When you eat, eating slowly may help prevent nausea.There are also some antinausea medicines that may help prevent nausea. HOME CARE INSTRUCTIONS   Take all medicine as directed by your caregiver.  If you do not have an appetite, do not force yourself to eat. However, you must continue to drink fluids.  If you  have an appetite, eat a normal diet unless your caregiver tells you differently.  Eat a variety of complex carbohydrates (rice, wheat, potatoes, bread), lean meats, yogurt, fruits, and vegetables.  Avoid high-fat foods because they are more difficult to digest.  Drink enough water and fluids to keep your urine clear or pale yellow.  If you are dehydrated, ask your caregiver for specific rehydration instructions. Signs of dehydration may include:  Severe thirst.  Dry lips and mouth.  Dizziness.  Dark urine.  Decreasing urine frequency and amount.  Confusion.  Rapid breathing or pulse. SEEK IMMEDIATE MEDICAL CARE IF:   You have blood or brown flecks (like coffee grounds) in your vomit.  You have black or bloody stools.  You have a severe headache or stiff neck.  You are confused.  You have severe abdominal pain.  You have chest pain or trouble breathing.  You do not urinate at least once every 8 hours.  You develop cold or clammy skin.  You continue to vomit for longer than 24 to 48 hours.  You have a fever. MAKE SURE YOU:   Understand these instructions.  Will watch your condition.  Will get help right away if you are not doing well or get worse.   This information is not intended to replace advice given to you by your health care provider. Make sure you discuss any questions you have with your health care provider.   Document Released: 09/01/2005 Document Revised: 11/24/2011 Document Reviewed: 01/29/2011 Elsevier Interactive Patient Education 2016 [employer]  Nausea, Adult Nausea is the feeling that you have an upset stomach or have to vomit. Nausea by itself is not likely a serious concern, but it may be an early sign of more serious medical problems. As nausea gets worse, it can lead to vomiting. If vomiting develops, there is the risk of dehydration.  CAUSES   Viral infections.  Food poisoning.  Medicines.  Pregnancy.  Motion  sickness.  Migraine headaches.  Emotional distress.  Severe pain from any source.  Alcohol intoxication. HOME CARE INSTRUCTIONS  Get plenty of rest.  Ask your caregiver about specific rehydration instructions.  Eat small amounts of food and sip liquids more often.  Take all medicines as told by your caregiver. SEEK MEDICAL CARE IF:  You have not improved after 2 days, or you get worse.  You have a headache. SEEK IMMEDIATE MEDICAL CARE IF:   You have a fever.  You faint.  You keep vomiting or have blood in your vomit.  You are extremely weak or dehydrated.  You have dark or bloody stools.  You have severe chest or abdominal pain. MAKE SURE YOU:  Understand these instructions.  Will watch your condition.  Will get help right away if you are not doing well or get worse.   This information is not intended to replace advice given to you by your health care provider. Make sure you discuss any questions you have with your health  care provider.   Document Released: 10/09/2004 Document Revised: 09/22/2014 Document Reviewed: 05/14/2011 Elsevier Interactive Patient Education 2016 [employer]  Peptic Ulcer A peptic ulcer is a painful sore in the lining of your esophagus, stomach, or in the first part of your small intestine. The main causes of an ulcer can be:  An infection.  Using certain pain medicines too often or too much.  Smoking. HOME CARE  Avoid smoking, alcohol, and caffeine.  Avoid foods that bother you.  Only take medicine as told by your doctor. Do not take any medicines your doctor has not approved.  Keep all doctor visits as told. GET HELP IF:  You do not get better in 7 days after starting treatment.  You keep having an upset stomach (indigestion) or heartburn. GET HELP RIGHT AWAY IF:  You have sudden, sharp, or lasting belly (abdominal) pain.  You have bloody, black, or tarry poop (stool).  You throw up (vomit) blood or your throw up  looks like coffee grounds.  You get light-headed, weak, or feel like you will pass out (faint).  You get sweaty or feel sticky and cold to the touch (clammy). MAKE SURE YOU:   Understand these instructions.  Will watch your condition.  Will get help right away if you are not doing well or get worse.   This information is not intended to replace advice given to you by your health care provider. Make sure you discuss any questions you have with your health care provider.   Document Released: 11/26/2009 Document Revised: 09/22/2014 Document Reviewed: 03/31/2012 Elsevier Interactive Patient Education 2016 [employer]   Peptic Ulcer A peptic ulcer is a sore in the lining of your esophagus (esophageal ulcer), stomach (gastric ulcer), or in the first part of your small intestine (duodenal ulcer). The ulcer causes erosion into the deeper tissue. CAUSES  Normally, the lining of the stomach and the small intestine protects itself from the acid that digests food. The protective lining can be damaged by:  An infection caused by a bacterium called Helicobacter pylori (H. pylori).  Regular use of nonsteroidal anti-inflammatory drugs (NSAIDs), such as ibuprofen or aspirin.  Smoking tobacco. Other risk factors include being older than 67, drinking alcohol excessively, and having a family history of ulcer disease.  SYMPTOMS   Burning pain or gnawing in the area between the chest and the belly button.  Heartburn.  Nausea and vomiting.  Bloating. The pain can be worse on an empty stomach and at night. If the ulcer results in bleeding, it can cause:  Black, tarry stools.  Vomiting of bright red blood.  Vomiting of coffee-ground-looking materials. DIAGNOSIS  A diagnosis is usually made based upon your history and an exam. Other tests and procedures may be performed to find the cause of the ulcer. Finding a cause will help determine the best treatment. Tests and procedures may  include:  Blood tests, stool tests, or breath tests to check for the bacterium H. pylori.  An upper gastrointestinal (GI) series of the esophagus, stomach, and small intestine.  An endoscopy to examine the esophagus, stomach, and small intestine.  A biopsy. TREATMENT  Treatment may include:  Eliminating the cause of the ulcer, such as smoking, NSAIDs, or alcohol.  Medicines to reduce the amount of acid in your digestive tract.  Antibiotic medicines if the ulcer is caused by the H. pylori bacterium.  An upper endoscopy to treat a bleeding ulcer.  Surgery if the bleeding is severe or if the ulcer  created a hole somewhere in the digestive system. HOME CARE INSTRUCTIONS   Avoid tobacco, alcohol, and caffeine. Smoking can increase the acid in the stomach, and continued smoking will impair the healing of ulcers.  Avoid foods and drinks that seem to cause discomfort or aggravate your ulcer.  Only take medicines as directed by your caregiver. Do not substitute over-the-counter medicines for prescription medicines without talking to your caregiver.  Keep any follow-up appointments and tests as directed. SEEK MEDICAL CARE IF:   Your do not improve within 7 days of starting treatment.  You have ongoing indigestion or heartburn. SEEK IMMEDIATE MEDICAL CARE IF:   You have sudden, sharp, or persistent abdominal pain.  You have bloody or dark black, tarry stools.  You vomit blood or vomit that looks like coffee grounds.  You become light-headed, weak, or feel faint.  You become sweaty or clammy. MAKE SURE YOU:   Understand these instructions.  Will watch your condition.  Will get help right away if you are not doing well or get worse.   This information is not intended to replace advice given to you by your health care provider. Make sure you discuss any questions you have with your health care provider.   Document Released: 08/29/2000 Document Revised: 09/22/2014 Document  Reviewed: 03/31/2012 Elsevier Interactive Patient Education [employer].

## 2016-01-14 NOTE — ED Notes (Signed)
Pt reports understanding of discharge information. No questions at time of discharge 

## 2016-01-14 NOTE — ED Provider Notes (Signed)
CSN: [id number]     Arrival date & time 01/13/16  2334 History   First MD Initiated Contact with Patient 01/14/16 0004     Chief Complaint  Patient presents with  . Fatigue  . Headache  . Chest Pain     (Consider location/radiation/quality/duration/timing/severity/associated sxs/prior Treatment) HPI   Pt is a 35 y/o female presents to the ER with multiple complaints including 3 days of general malaise with weakness, intermittent abdominal pain with nausea, vomiting 2x today with "streaks of blood", diarrhea "constantly" x 5 day, stools reported to be dark in color, HA.  She also reports hx of seizures.  States she had a seizure yesterday when she was at her sisters house.  Although she was with family, she is unable to answer any questions about seizure, including, type of seizure activity, duration, associated sx.  She could not answer what position she was in before it happened, or if she fall over, or soiled herself.  Only history provided is that she woke up "soaking wet."  She also reports multiple syncopal episodes.  She states she called an ambulance, but passed out before they got there.  She cannot provide any other details about her syncopal episodes either.  She denies known head injury, denies HA, neck pain, injuries on body.   Most concerning complaint today is her abdominal pain located in the epigastrum, with radiation to LUQ, which is constant, described as burning and stabbing pain, 10/10, worse with movement, and she states she is unable "lay on stomach at all."  She states it feels similar to when she had ulcers last year prior to a hernia repair one year ago.  She states pain has gradually worsened, pain is much worse with eating, today she had severe pain with even water.  Pt also complains that weakness feels "like heart is slowing down," she complains of back pain, inspiratory CP that radiates to the back, and mild SOB sensation from not wanted to take deep breaths.  She has hx  of asthma, but denies wheeze or current exacerbation.  She denies fever, dysuria, hematuria, flank pain, vaginal irrigation, vaginal discharge.  No palpitations, LE edema.   Past Medical History  Diagnosis Date  . Epilepsy ([place])   . Asthma   . Stroke ([place])   . Seizures ([place])     last seizure 07-2015  . Stomach ulcer   . Fibroid tumor   . Anxiety   . Depression   . Migraine    Past Surgical History  Procedure Laterality Date  . Umbilical hernia repair  12/2014   Family History  Problem Relation Age of Onset  . Heart failure Mother   . Cancer Father   . Seizures Father   . Drug abuse Sister   . Alcohol abuse Sister   . Alcohol abuse Sister   . Drug abuse Sister    Social History  Substance Use Topics  . Smoking status: Current Every Day Smoker -- 0.25 packs/day for 0 years    Types: Cigars  . Smokeless tobacco: Never Used     Comment: BLACK MILD  . Alcohol Use: 0.6 oz/week    1 Cans of beer per week     Comment: occ   OB History    Gravida Para Term Preterm AB TAB SAB Ectopic Multiple Living   0 0 0 0 0 0 0 0       Review of Systems  All other systems reviewed and are negative.  Allergies  Bee venom; Chocolate; Mosquito (diagnostic); Orange fruit; Oregano; Penicillins; Dilantin; Garlic; Tramadol; and Tape  Home Medications   Prior to Admission medications   Medication Sig Start Date End Date Taking? Authorizing Provider  albuterol (PROVENTIL HFA;VENTOLIN HFA) 108 (90 Base) MCG/ACT inhaler Inhale 2 puffs into the lungs every 4 (four) hours as needed for wheezing or shortness of breath. 12/20/15  Yes [name], MD  ARIPiprazole (ABILIFY) 10 MG tablet Take 1 tablet (10 mg total) by mouth daily. For mood control 09/12/15  Yes Encarnacion Slates, NP  cetirizine (ZYRTEC) 10 MG tablet Take 1 tablet (10 mg total) by mouth daily as needed for allergies. 11/08/15  Yes Corky Sox, MD  fluticasone furoate-vilanterol (BREO ELLIPTA) 200-25 MCG/INH AEPB Inhale 1 puff  into the lungs daily after breakfast. For Asthma 12/20/15  Yes [name], MD  hydrOXYzine (ATARAX/VISTARIL) 25 MG tablet Take 1 tablet (25 mg total) by mouth every 6 (six) hours as needed for anxiety. 09/12/15  Yes Encarnacion Slates, NP  levETIRAcetam (KEPPRA) 1000 MG tablet One po qam and 2 tab po qhs 12/31/15  Yes Marcial Pacas, MD  omeprazole (PRILOSEC) 40 MG capsule Take 1 capsule (40 mg total) by mouth daily. 11/08/15  Yes Corky Sox, MD  OXcarbazepine (TRILEPTAL) 150 MG tablet Take 1 tablet (150 mg total) by mouth 2 (two) times daily. For mood stabilization 09/12/15  Yes Encarnacion Slates, NP  sertraline (ZOLOFT) 50 MG tablet Take 1 tablet (50 mg total) by mouth at bedtime. For depression 11/08/15  Yes Corky Sox, MD  bismuth subsalicylate (PEPTO-BISMOL) 262 MG/15ML suspension Take 30 mLs by mouth every 6 (six) hours as needed. Patient not taking: Reported on 01/13/2016 09/21/15   Leo Grosser, MD  HYDROcodone-acetaminophen (NORCO/VICODIN) 5-325 MG tablet Take 1 tablet by mouth every 8 (eight) hours as needed for severe pain. 01/14/16   Delsa Grana, PA-C  ondansetron (ZOFRAN ODT) 4 MG disintegrating tablet Take 1 tablet (4 mg total) by mouth every 8 (eight) hours as needed for nausea or vomiting. 01/14/16   Delsa Grana, PA-C  pantoprazole (PROTONIX) 20 MG tablet Take 1 tablet (20 mg total) by mouth daily. 01/14/16   Delsa Grana, PA-C   BP 114/75 mmHg  Pulse 72  Temp(Src) 98 F (36.7 C) (Oral)  Resp 20  Ht 5\' 4"  (1.626 m)  Wt 55.792 kg  BMI 21.10 kg/m2  SpO2 99%  LMP 12/29/2015 Physical Exam  Constitutional: She is oriented to person, place, and time. She appears well-developed and well-nourished. No distress.  HENT:  Head: Normocephalic and atraumatic.  Nose: Nose normal.  Mouth/Throat: Oropharynx is clear and moist. No oropharyngeal exudate.  Eyes: Conjunctivae and EOM are normal. Pupils are equal, round, and reactive to light. Right eye exhibits no discharge. Left eye exhibits no discharge. No  scleral icterus.  Neck: Normal range of motion. No JVD present. No tracheal deviation present. No thyromegaly present.  Cardiovascular: Normal rate, regular rhythm, normal heart sounds and intact distal pulses.  Exam reveals no gallop and no friction rub.   No murmur heard. Pulmonary/Chest: Effort normal and breath sounds normal. No accessory muscle usage. No respiratory distress. She has no wheezes. She has no rhonchi. She has no rales. She exhibits no tenderness.  Mildly decreased BS bilaterally at the bases with splinting, no wheeze, no rales, no rhonchi, no accessory muscle use, no respiratory distress  Abdominal: Soft. Normal appearance. She exhibits no distension, no abdominal bruit, no pulsatile midline  mass and no mass. Bowel sounds are decreased. There is generalized tenderness. There is guarding. There is no rigidity, no rebound, no CVA tenderness, no tenderness at McBurney's point and negative Murphy's sign.  Musculoskeletal: Normal range of motion. She exhibits no edema or tenderness.  Lymphadenopathy:    She has no cervical adenopathy.  Neurological: She is alert and oriented to person, place, and time. She has normal reflexes. No cranial nerve deficit. She exhibits normal muscle tone. Coordination normal.  Skin: Skin is warm and dry. No rash noted. She is not diaphoretic. No erythema. No pallor.  Psychiatric: She has a normal mood and affect. Her behavior is normal. Judgment and thought content normal.  Nursing note and vitals reviewed.   ED Course  Procedures (including critical care time) Labs Review Labs Reviewed  URINALYSIS, ROUTINE W REFLEX MICROSCOPIC (NOT AT [facility]) - Abnormal; Notable for the following:    Color, Urine AMBER (*)    APPearance CLOUDY (*)    Specific Gravity, Urine 1.046 (*)    Bilirubin Urine SMALL (*)    Ketones, ur >80 (*)    Protein, ur 30 (*)    All other components within normal limits  COMPREHENSIVE METABOLIC PANEL - Abnormal; Notable for the  following:    Potassium 3.4 (*)    Total Protein 8.6 (*)    Albumin 5.2 (*)    All other components within normal limits  URINE MICROSCOPIC-ADD ON - Abnormal; Notable for the following:    Squamous Epithelial / LPF 6-30 (*)    Bacteria, UA MANY (*)    All other components within normal limits  I-STAT CHEM 8, ED - Abnormal; Notable for the following:    Potassium 3.3 (*)    Hemoglobin 16.0 (*)    HCT 47.0 (*)    All other components within normal limits  CBC  LIPASE, BLOOD  CBG MONITORING, ED  POC URINE PREG, ED  CBG MONITORING, ED  POC OCCULT BLOOD, ED  TYPE AND SCREEN  ABO/RH    Imaging Review Dg Abd Acute W/chest  01/14/2016  CLINICAL DATA:  Mid to left chest pain radiating to the back. Left-sided abdominal pain. Nausea, vomiting, diarrhea, and weakness for 1 week. Upper GI on 01/11/2016 EXAM: DG ABDOMEN ACUTE W/ 1V CHEST COMPARISON:  01/11/2016 upper GI.  Chest 09/10/2015 FINDINGS: Normal heart size and pulmonary vascularity. No focal airspace disease or consolidation in the lungs. No blunting of costophrenic angles. No pneumothorax. Mediastinal contours appear intact. Residual contrast material in the colon. No small or large bowel dilatation. No free intra-abdominal air. No abnormal air-fluid levels. No radiopaque stones. Visualized bones appear intact. IMPRESSION: No evidence of active pulmonary disease. Residual contrast material in the colon. No evidence of bowel obstruction. Electronically Signed   By: Lucienne Capers M.D.   On: 01/14/2016 02:33   I have personally reviewed and evaluated these images and lab results as part of my medical decision-making.   EKG Interpretation   Date/Time:  Sunday January 13 2016 23:50:47 EDT Ventricular Rate:  72 PR Interval:  158 QRS Duration: 89 QT Interval:  393 QTC Calculation: 430 R Axis:   82 Text Interpretation:  Sinus or ectopic atrial rhythm Borderline T  abnormalities, anterior leads No acute changes No significant change  since  last tracing Confirmed by Kathrynn Humble, MD, Thelma Comp [phone]) on 01/14/2016 12:30:42  AM      MDM   Tina [name] is a 35 year old female presents to the emergency department with multiple complaints  including fatigue headache, abdominal pain, nausea, vomiting and diarrhea, syncopal episodes and seizures.  She complains of generalized weakness over the past 3 days.  She has been worked up last week by GI, does have a history of GERD with a hernia repair. She states that her pain today is located in her epigastrium area with radiation everywhere, feels like when she had an ulcer before her hernia repair one year ago.  Pain is reported to be gradual in nature, associated with pain when eating. Patient also complains of hematemesis and dark coffee-ground appearing stools.  She denies urinary complaints and denies vaginal complaints  Basic labs including lipase, urinalysis, Hemoccult, and type and screen, plain films of the abdomen and chest, EKG, orthostatics. On exam patient's abdominal tenderness is generalized without rebound, but with voluntary guarding. Lungs are clear to auscultation and cardiac exam was normal.  Patient was given Protonix, antiemetics, pain medicine and fluids Her workup was significant for mild hyperkalemia, urinalysis was contaminated evidence of positive ketones, elevated specific gravity, may reflect dehydration, many bacteria present and mucus however it was a contaminated sample and patient did not have any leukocytes, negative nitrites. Patient is asymptomatic, do not clinically suspect UTI. Patient is adamant of no STD exposure and she refuses pelvic exam or STD testing .  X-ray chest and abdomen was normal. Given her exam and history with gradual onset of pain, do not suspect perforated ulcer. Hemoccult was negative which is also reassuring for low likelyhood of active GI bleed.  She has been seeing GI for pain when swallowing, she may have  gastritis/esophagitis/PUD, will give a PPI trial of Protonix, as she states that omeprazole was not working, and have her follow up with GI.  Also be discharged with antiemetics and few pain medicine.  She has no neurological deficit at this time, suggest she follows up with neurology as needed for reported seizures however her history is vague, and reports seizures are several days ago, is not her acute complaint today.  She also complains of generalized weakness and near syncope/syncope, also extremely vague and inconsistent hx, she is not orthostatic, vital signs are stable, she has been able to ambulate w/o difficulty, no exertional CP, SOB, near-syncope while here in the ER.   No evidence of head injury, patient is alert and oriented throughout her stay here.  She has been able to stand and able it without difficulty.  Do not suspect orthostatic hypotension.  The patient had recent MRI of her brain, and also not suspect intracranial pathology, headache resolved with fluids, may be secondary to dehydration. She was discharged after pt tolerated PO's after GI cocktail.  She continued to ask for narcotic pain medication, she was able to take an oral dose with potassium prior to discharge. Tolerating PO's and hemodynamically stable, pt was safe to discharge home, encouraged to follow up with her neurologist and gastroenterologist. Filed Vitals:   01/14/16 0130 01/14/16 0317 01/14/16 0330 01/14/16 0430  BP: 118/84 114/75 110/77 117/84  Pulse: 65 72 63 59  Temp:      TempSrc:      Resp: 14 20 12 11   Height:      Weight:      SpO2: 99% 99% 97% 100%     Final diagnoses:  Generalized abdominal pain  Gastritis  Nausea vomiting and diarrhea       Delsa Grana, PA-C 01/17/16 [place], MD 01/18/16 [phone]

## 2016-01-22 ENCOUNTER — Ambulatory Visit (HOSPITAL_COMMUNITY): Payer: Self-pay | Admitting: Clinical

## 2016-01-24 ENCOUNTER — Telehealth: Payer: Self-pay | Admitting: Gastroenterology

## 2016-02-07 ENCOUNTER — Encounter (HOSPITAL_COMMUNITY): Payer: Self-pay | Admitting: Vascular Surgery

## 2016-02-07 ENCOUNTER — Emergency Department (HOSPITAL_COMMUNITY)
Admission: EM | Admit: 2016-02-07 | Discharge: 2016-02-07 | Disposition: A | Discharge status: Home / Self Care | Payer: PRIVATE HEALTH INSURANCE | Attending: Emergency Medicine | Admitting: Emergency Medicine

## 2016-02-07 DIAGNOSIS — Z7951 Long term (current) use of inhaled steroids: Secondary | ICD-10-CM | POA: Insufficient documentation

## 2016-02-07 DIAGNOSIS — F419 Anxiety disorder, unspecified: Secondary | ICD-10-CM | POA: Insufficient documentation

## 2016-02-07 DIAGNOSIS — Z880 Allergy status to penicillin: Secondary | ICD-10-CM | POA: Insufficient documentation

## 2016-02-07 DIAGNOSIS — Z79899 Other long term (current) drug therapy: Secondary | ICD-10-CM | POA: Insufficient documentation

## 2016-02-07 DIAGNOSIS — G40909 Epilepsy, unspecified, not intractable, without status epilepticus: Secondary | ICD-10-CM | POA: Insufficient documentation

## 2016-02-07 DIAGNOSIS — Z8673 Personal history of transient ischemic attack (TIA), and cerebral infarction without residual deficits: Secondary | ICD-10-CM | POA: Insufficient documentation

## 2016-02-07 DIAGNOSIS — Z8719 Personal history of other diseases of the digestive system: Secondary | ICD-10-CM | POA: Insufficient documentation

## 2016-02-07 DIAGNOSIS — F17210 Nicotine dependence, cigarettes, uncomplicated: Secondary | ICD-10-CM | POA: Insufficient documentation

## 2016-02-07 DIAGNOSIS — Z3202 Encounter for pregnancy test, result negative: Secondary | ICD-10-CM | POA: Insufficient documentation

## 2016-02-07 DIAGNOSIS — J45909 Unspecified asthma, uncomplicated: Secondary | ICD-10-CM | POA: Insufficient documentation

## 2016-02-07 DIAGNOSIS — R569 Unspecified convulsions: Secondary | ICD-10-CM

## 2016-02-07 DIAGNOSIS — F329 Major depressive disorder, single episode, unspecified: Secondary | ICD-10-CM | POA: Insufficient documentation

## 2016-02-07 DIAGNOSIS — Z86018 Personal history of other benign neoplasm: Secondary | ICD-10-CM | POA: Insufficient documentation

## 2016-02-07 LAB — CBC WITH DIFFERENTIAL/PLATELET
Basophils Absolute: 0 10*3/uL (ref 0.0–0.1)
Basophils Relative: 0 %
Eosinophils Absolute: 0.1 10*3/uL (ref 0.0–0.7)
Eosinophils Relative: 2 %
HCT: 38.6 % (ref 36.0–46.0)
Hemoglobin: 12.5 g/dL (ref 12.0–15.0)
Lymphocytes Relative: 38 %
Lymphs Abs: 2.2 10*3/uL (ref 0.7–4.0)
MCH: 31 pg (ref 26.0–34.0)
MCHC: 32.4 g/dL (ref 30.0–36.0)
MCV: 95.8 fL (ref 78.0–100.0)
Monocytes Absolute: 0.5 10*3/uL (ref 0.1–1.0)
Monocytes Relative: 9 %
Neutro Abs: 3.1 10*3/uL (ref 1.7–7.7)
Neutrophils Relative %: 51 %
Platelets: 253 10*3/uL (ref 150–400)
RBC: 4.03 MIL/uL (ref 3.87–5.11)
RDW: 12.3 % (ref 11.5–15.5)
WBC: 6 10*3/uL (ref 4.0–10.5)

## 2016-02-07 LAB — BASIC METABOLIC PANEL
Anion gap: 6 (ref 5–15)
BUN: 15 mg/dL (ref 6–20)
CO2: 22 mmol/L (ref 22–32)
Calcium: 9.6 mg/dL (ref 8.9–10.3)
Chloride: 108 mmol/L (ref 101–111)
Creatinine, Ser: 0.86 mg/dL (ref 0.44–1.00)
GFR calc Af Amer: >60 mL/min (ref >60)
GFR calc non Af Amer: >60 mL/min (ref >60)
Glucose, Bld: 81 mg/dL (ref 65–99)
Potassium: 4.4 mmol/L (ref 3.5–5.1)
Sodium: 136 mmol/L (ref 135–145)

## 2016-02-07 LAB — RAPID URINE DRUG SCREEN, HOSP PERFORMED
Amphetamines: NOT DETECTED
Barbiturates: NOT DETECTED
Benzodiazepines: POSITIVE — AB
Cocaine: NOT DETECTED
Opiates: NOT DETECTED
Tetrahydrocannabinol: NOT DETECTED

## 2016-02-07 LAB — URINALYSIS, ROUTINE W REFLEX MICROSCOPIC
Bilirubin Urine: NEGATIVE
Glucose, UA: NEGATIVE mg/dL
Hgb urine dipstick: NEGATIVE
Ketones, ur: NEGATIVE mg/dL
Leukocytes, UA: NEGATIVE
Nitrite: NEGATIVE
Protein, ur: NEGATIVE mg/dL
Specific Gravity, Urine: 1.012 (ref 1.005–1.030)
pH: 5.5 (ref 5.0–8.0)

## 2016-02-07 LAB — PREGNANCY, URINE: Preg Test, Ur: NEGATIVE

## 2016-02-07 LAB — ETHANOL: Alcohol, Ethyl (B): <5 mg/dL (ref <5)

## 2016-02-07 MED ORDER — METOCLOPRAMIDE HCL 5 MG/ML IJ SOLN
10.0000 mg | Freq: Once | INTRAMUSCULAR | Status: AC
  Administered 2016-02-07: 10 mg via INTRAVENOUS
  Filled 2016-02-07: qty 2

## 2016-02-07 MED ORDER — LEVETIRACETAM 1000 MG PO TABS: 1000.0000 mg | ORAL_TABLET | Freq: Two times a day (BID) | ORAL | Status: DC

## 2016-02-07 MED ORDER — DIPHENHYDRAMINE HCL 50 MG/ML IJ SOLN
25.0000 mg | Freq: Once | INTRAMUSCULAR | Status: AC
  Administered 2016-02-07: 25 mg via INTRAVENOUS
  Filled 2016-02-07: qty 1

## 2016-02-07 MED ORDER — SODIUM CHLORIDE 0.9 % IV SOLN
1000.0000 mg | Freq: Once | INTRAVENOUS | Status: AC
  Administered 2016-02-07: 1000 mg via INTRAVENOUS
  Filled 2016-02-07: qty 10

## 2016-02-07 MED ORDER — LORAZEPAM 2 MG/ML IJ SOLN
INTRAMUSCULAR | Status: AC
  Filled 2016-02-07: qty 1

## 2016-02-07 MED ORDER — KETOROLAC TROMETHAMINE 15 MG/ML IJ SOLN
15.0000 mg | Freq: Once | INTRAMUSCULAR | Status: AC
  Administered 2016-02-07: 15 mg via INTRAVENOUS
  Filled 2016-02-07: qty 1

## 2016-02-07 NOTE — ED Notes (Signed)
Pt provided with apple juice as requested.

## 2016-02-07 NOTE — ED Notes (Signed)
PA Hedges approved fluid and food intake for pt

## 2016-02-07 NOTE — ED Notes (Signed)
Dinner tray ordered.

## 2016-02-07 NOTE — ED Provider Notes (Signed)
CSN: [id number]     Arrival date & time 02/07/16  1615 History   First MD Initiated Contact with Patient 02/07/16 1623     Chief Complaint  Patient presents with  . Seizures   HPI    35 year old female with a past medical history of epilepsy, asthma, CVA, M.D. DD, alcohol abuse, THC misuse, previous suicidal ideations and attempts presents to the hospital today with reported seizure. Patient reports that she Does not have insurance and cannot afford her Keppra. Patient notes that she has not been taking any medications, and notes that she had 2 seizures this morning prior to arrival. Patient reports they are full body seizures with jerking, the last several minutes, with postictal phase thereafter. Patient denies any injuries from the seizure.     Past Medical History  Diagnosis Date  . Epilepsy ([place])   . Asthma   . Stroke ([place])   . Seizures ([place])     last seizure 07-2015  . Stomach ulcer   . Fibroid tumor   . Anxiety   . Depression   . Migraine    Past Surgical History  Procedure Laterality Date  . Umbilical hernia repair  12/2014   Family History  Problem Relation Age of Onset  . Heart failure Mother   . Cancer Father   . Seizures Father   . Drug abuse Sister   . Alcohol abuse Sister   . Alcohol abuse Sister   . Drug abuse Sister    Social History  Substance Use Topics  . Smoking status: Current Every Day Smoker -- 0.25 packs/day for 0 years    Types: Cigars  . Smokeless tobacco: Never Used     Comment: BLACK MILD  . Alcohol Use: 0.6 oz/week    1 Cans of beer per week     Comment: occ   OB History    Gravida Para Term Preterm AB TAB SAB Ectopic Multiple Living   0 0 0 0 0 0 0 0       Review of Systems  All other systems reviewed and are negative.   Allergies  Bee venom; Chocolate; Mosquito (diagnostic); Orange fruit; Oregano; Penicillins; Dilantin; Garlic; Tramadol; and Tape  Home Medications   Prior to Admission medications   Medication Sig Start  Date End Date Taking? Authorizing Provider  albuterol (PROVENTIL HFA;VENTOLIN HFA) 108 (90 Base) MCG/ACT inhaler Inhale 2 puffs into the lungs every 4 (four) hours as needed for wheezing or shortness of breath. 12/20/15  Yes [name], MD  ARIPiprazole (ABILIFY) 10 MG tablet Take 1 tablet (10 mg total) by mouth daily. For mood control Patient not taking: Reported on 02/07/2016 09/12/15   Encarnacion Slates, NP  bismuth subsalicylate (PEPTO-BISMOL) 262 MG/15ML suspension Take 30 mLs by mouth every 6 (six) hours as needed. Patient not taking: Reported on 01/13/2016 09/21/15   Leo Grosser, MD  cetirizine (ZYRTEC) 10 MG tablet Take 1 tablet (10 mg total) by mouth daily as needed for allergies. Patient not taking: Reported on 02/07/2016 11/08/15   Corky Sox, MD  fluticasone furoate-vilanterol (BREO ELLIPTA) 200-25 MCG/INH AEPB Inhale 1 puff into the lungs daily after breakfast. For Asthma Patient not taking: Reported on 02/07/2016 12/20/15   [name], MD  HYDROcodone-acetaminophen (NORCO/VICODIN) 5-325 MG tablet Take 1 tablet by mouth every 8 (eight) hours as needed for severe pain. Patient not taking: Reported on 02/07/2016 01/14/16   Delsa Grana, PA-C  hydrOXYzine (ATARAX/VISTARIL) 25 MG tablet Take 1 tablet (  25 mg total) by mouth every 6 (six) hours as needed for anxiety. Patient not taking: Reported on 02/07/2016 09/12/15   Encarnacion Slates, NP  levETIRAcetam (KEPPRA) 1000 MG tablet Take 1 tablet (1,000 mg total) by mouth 2 (two) times daily. 02/07/16   Okey Regal, PA-C  omeprazole (PRILOSEC) 40 MG capsule Take 1 capsule (40 mg total) by mouth daily. Patient not taking: Reported on 02/07/2016 11/08/15   Corky Sox, MD  ondansetron (ZOFRAN ODT) 4 MG disintegrating tablet Take 1 tablet (4 mg total) by mouth every 8 (eight) hours as needed for nausea or vomiting. Patient not taking: Reported on 02/07/2016 01/14/16   Delsa Grana, PA-C  OXcarbazepine (TRILEPTAL) 150 MG tablet Take 1 tablet (150 mg  total) by mouth 2 (two) times daily. For mood stabilization Patient not taking: Reported on 02/07/2016 09/12/15   Encarnacion Slates, NP  pantoprazole (PROTONIX) 20 MG tablet Take 1 tablet (20 mg total) by mouth daily. Patient not taking: Reported on 02/07/2016 01/14/16   Delsa Grana, PA-C  sertraline (ZOLOFT) 50 MG tablet Take 1 tablet (50 mg total) by mouth at bedtime. For depression Patient not taking: Reported on 02/07/2016 11/08/15   Corky Sox, MD   BP 97/68 mmHg  Pulse 78  Temp(Src) 97.4 F (36.3 C) (Oral)  Resp 18  SpO2 100%  LMP 12/29/2015   Physical Exam  Constitutional: She is oriented to person, place, and time. She appears well-developed and well-nourished.  HENT:  Head: Normocephalic and atraumatic.  Eyes: Conjunctivae are normal. Pupils are equal, round, and reactive to light. Right eye exhibits no discharge. Left eye exhibits no discharge. No scleral icterus.  Neck: Normal range of motion. No JVD present. No tracheal deviation present.  Cardiovascular: Normal rate, regular rhythm, normal heart sounds and intact distal pulses.  Exam reveals no gallop and no friction rub.   No murmur heard. Pulmonary/Chest: Effort normal and breath sounds normal. No stridor. No respiratory distress. She has no wheezes. She has no rales. She exhibits no tenderness.  Neurological: She is alert and oriented to person, place, and time. She has normal strength. No cranial nerve deficit or sensory deficit. Coordination normal. GCS eye subscore is 4. GCS verbal subscore is 5. GCS motor subscore is 6.  Skin: Skin is warm and dry. No rash noted. No erythema. No pallor.  Psychiatric: She has a normal mood and affect. Her behavior is normal. Judgment and thought content normal.  Nursing note and vitals reviewed.   ED Course  Procedures (including critical care time) Labs Review Labs Reviewed  URINE RAPID DRUG SCREEN, HOSP PERFORMED - Abnormal; Notable for the following:    Benzodiazepines POSITIVE (*)     All other components within normal limits  CBC WITH DIFFERENTIAL/PLATELET  BASIC METABOLIC PANEL  ETHANOL  URINALYSIS, ROUTINE W REFLEX MICROSCOPIC (NOT AT [facility])  PREGNANCY, URINE    Imaging Review No results found. I have personally reviewed and evaluated these images and lab results as part of my medical decision-making.   EKG Interpretation   Date/Time:  Thursday Feb 07 2016 16:20:46 EDT Ventricular Rate:  75 PR Interval:  159 QRS Duration: 89 QT Interval:  373 QTC Calculation: 417 R Axis:   85 Text Interpretation:  Sinus rhythm No significant change since last  tracing Confirmed by NGUYEN, EMILY ([id number]) on 02/07/2016 5:18:56 PM      MDM   Final diagnoses:  Seizures ([place])    Labs: Urinalysis, urine rapid drug screen, CBC, BMP, ethanol  Imaging:   Consults:  Therapeutics: Keppra 1 gram  Discharge Meds:   Assessment/Plan:  36 year old female presents today with reported seizure. Patient reports 2 episodes of seizures prior to evaluation. I was called into the room with patient having a seizure. She had both upper and lower body shaking, she was not responding to my line of questioning. This lasted approximately 1 minute and resolved on its own without intervention. Patient was not responding again to my questioning after seizure activities stopped. I'm uncertain if this was true seizure-like activity, patient was put on seizure protocol and had labs drawn. Patient has been on "Keppra and was given a loading dose here in the ED. Patient had very minor headache symptoms approximately one hour after the seizure, no neurological deficits.   Chart review shows patient has reported history of seizures, I was unable to find any objective evidence including EEG studies that show this. Patient had an EEG done on 10/31/2015 that showed no skin findings ( which does not exclude diagnosis of epilepsy)  Patient appears well here in the ED, she has her significant other at  bedside and no more seizure-like activity while here in the ED, case management consult at for assistance with medication. Patient has an appointment tomorrow morning at [facility] and wellness for medication assistance and reevaluation. I explained this to the patient and she acted as if she didn't know she had an appointment. Patient was uninterested in my conversation. I again stated how important it was for patient follow-up for this medication and visit, she agreed that she would go tomorrow. Patient is in no acute distress, she is discharged home with follow-up and strict return precautions.               Okey Regal, PA-C 02/07/16 [id number]  Harvel Quale, MD 02/10/16 [phone]

## 2016-02-07 NOTE — Discharge Instructions (Signed)

## 2016-02-07 NOTE — ED Notes (Signed)
Pt reports to the ED for eval of seizure activity. Pt has hx of epilepsy. Per family she had 2 approx 10 min episodes of grand mal seizures. Pt is supposed to be on seizure medication (keppra) but she has been unable to afford her medication. Pt was postictal on EMS arrival. No fall or injury r/t the seizure. Denies any incontinence or oral trauma. 12 lead unremarkable. CBG - 99 mg/dl. VSS - WNL. Pt A&Ox4 at this time, resp e/u, and skin warm and dry.

## 2016-02-07 NOTE — Care Management (Signed)
CM was consulted concerning medication asisstance and follow up for seizures. Patient is uninsured and has had 12 visits in the past 6 months to ED for related complaints.  Discussed the importance and benefits of PCP and F/U, patient verabalized understandingShe states she is a patient at [facility] but have not been able to afford medications or appointments. Discussed the [facility] and services rendered. Patient is agreeable with establishing care at the clinic. F/U Appt scheduled for Tues 6/6 at 3:15 pm with Dr. Jarold Song.  Patient verbalized understanding teach back done. No further questions or concerns verbalized. CM informed patient that she can carry her prescription over to the  [facility] pharmacy to have it filled in the am, patient will receive last dose pf anti seizure medications in the ED.  No further CM needs identified

## 2016-02-19 ENCOUNTER — Ambulatory Visit: Payer: PRIVATE HEALTH INSURANCE | Attending: Family Medicine | Admitting: Family Medicine

## 2016-02-19 ENCOUNTER — Encounter: Payer: Self-pay | Admitting: Family Medicine

## 2016-02-19 VITALS — BP 101/69 | HR 78 | Temp 97.9°F | Resp 16 | Ht 64.0 in | Wt 128.0 lb

## 2016-02-19 DIAGNOSIS — K2970 Gastritis, unspecified, without bleeding: Secondary | ICD-10-CM

## 2016-02-19 DIAGNOSIS — F331 Major depressive disorder, recurrent, moderate: Secondary | ICD-10-CM

## 2016-02-19 DIAGNOSIS — J301 Allergic rhinitis due to pollen: Secondary | ICD-10-CM

## 2016-02-19 DIAGNOSIS — J45909 Unspecified asthma, uncomplicated: Secondary | ICD-10-CM | POA: Insufficient documentation

## 2016-02-19 DIAGNOSIS — J4530 Mild persistent asthma, uncomplicated: Secondary | ICD-10-CM

## 2016-02-19 DIAGNOSIS — G40301 Generalized idiopathic epilepsy and epileptic syndromes, not intractable, with status epilepticus: Secondary | ICD-10-CM

## 2016-02-19 MED ORDER — SERTRALINE HCL 50 MG PO TABS: 50.0000 mg | ORAL_TABLET | Freq: Every day | ORAL | Status: DC

## 2016-02-19 MED ORDER — BISMUTH SUBSALICYLATE 262 MG/15ML PO SUSP: 30.0000 mL | Freq: Four times a day (QID) | ORAL | Status: AC | PRN

## 2016-02-19 MED ORDER — PANTOPRAZOLE SODIUM 20 MG PO TBEC: 20.0000 mg | DELAYED_RELEASE_TABLET | Freq: Every day | ORAL | Status: AC

## 2016-02-19 MED ORDER — LEVETIRACETAM 1000 MG PO TABS: 1000.0000 mg | ORAL_TABLET | Freq: Two times a day (BID) | ORAL | Status: DC

## 2016-02-19 MED ORDER — ALBUTEROL SULFATE HFA 108 (90 BASE) MCG/ACT IN AERS: 2.0000 | INHALATION_SPRAY | RESPIRATORY_TRACT | Status: DC | PRN

## 2016-02-19 MED ORDER — CETIRIZINE HCL 10 MG PO TABS: 10.0000 mg | ORAL_TABLET | Freq: Every day | ORAL | Status: AC | PRN

## 2016-02-19 MED ORDER — FLUTICASONE FUROATE-VILANTEROL 200-25 MCG/INH IN AEPB: 1.0000 | INHALATION_SPRAY | Freq: Every day | RESPIRATORY_TRACT | Status: DC

## 2016-02-19 MED FILL — VENTOLIN HFA 90 MCG INHALER: 108 (90 BAS | 20 days supply | Qty: 18 | Fill #0

## 2016-02-19 MED FILL — ?SERTRALINE HCL 50 MG TABLE: 50 | 30 days supply | Qty: 30 | Fill #0

## 2016-02-19 MED FILL — ?CETIRIZINE HCL 10 MG TABLE: 10 | 30 days supply | Qty: 30 | Fill #0

## 2016-02-19 MED FILL — **BREO ELLIPTA 200-25 MCG I: 200-25 MCG | 20 days supply | Qty: 28 | Fill #0

## 2016-02-19 MED FILL — PANTOPRAZOLE SOD DR 20 MG T: 20 | 30 days supply | Qty: 30 | Fill #0

## 2016-02-19 MED FILL — levETIRAcetam 1000 MG TABS: 1000 | 30 days supply | Qty: 60 | Fill #0

## 2016-02-19 NOTE — Progress Notes (Signed)
Subjective:  Patient ID: Tina [name], female    DOB: 22-Mar-1981  Age: 35 y.o. MRN: [id number]  CC: Follow-up   HPI Tina [name] is a 35 year old female with a history of seizures, gastritis, depression, asthma who comes into the clinic to establish care.  She had an ED visit last month where she had presented with seizures and was also managed for gastritis which she received IV fluids, IV PPI, antiemetics and was subsequently discharged. She does not have medical coverage for her prescriptions and has been unable to obtain them. Followed by [facility].  Complains of being depressed due to the fact that she is unable to drive and keep a job due to her seizures and has an upcoming appointment with behavioral health in 03/2015 but is currently not on any anti-depressants at this time. Has had previous suicidal ideations in the past but denies any.  Denies shortness of breath, wheezing, chest pains.  Outpatient Prescriptions Prior to Visit  Medication Sig Dispense Refill  . albuterol (PROVENTIL HFA;VENTOLIN HFA) 108 (90 Base) MCG/ACT inhaler Inhale 2 puffs into the lungs every 4 (four) hours as needed for wheezing or shortness of breath. 1 Inhaler 2  . ARIPiprazole (ABILIFY) 10 MG tablet Take 1 tablet (10 mg total) by mouth daily. For mood control (Patient not taking: Reported on 02/07/2016) 30 tablet 0  . hydrOXYzine (ATARAX/VISTARIL) 25 MG tablet Take 1 tablet (25 mg total) by mouth every 6 (six) hours as needed for anxiety. (Patient not taking: Reported on 02/07/2016) 45 tablet 0  . OXcarbazepine (TRILEPTAL) 150 MG tablet Take 1 tablet (150 mg total) by mouth 2 (two) times daily. For mood stabilization (Patient not taking: Reported on 02/07/2016) 60 tablet 0  . bismuth subsalicylate (PEPTO-BISMOL) 262 MG/15ML suspension Take 30 mLs by mouth every 6 (six) hours as needed. (Patient not taking: Reported on 01/13/2016) 360 mL 0  . cetirizine (ZYRTEC)  10 MG tablet Take 1 tablet (10 mg total) by mouth daily as needed for allergies. (Patient not taking: Reported on 02/07/2016) 30 tablet 1  . fluticasone furoate-vilanterol (BREO ELLIPTA) 200-25 MCG/INH AEPB Inhale 1 puff into the lungs daily after breakfast. For Asthma (Patient not taking: Reported on 02/07/2016) 28 each 2  . HYDROcodone-acetaminophen (NORCO/VICODIN) 5-325 MG tablet Take 1 tablet by mouth every 8 (eight) hours as needed for severe pain. (Patient not taking: Reported on 02/07/2016) 10 tablet 0  . levETIRAcetam (KEPPRA) 1000 MG tablet Take 1 tablet (1,000 mg total) by mouth 2 (two) times daily. (Patient not taking: Reported on 02/19/2016) 60 tablet 0  . omeprazole (PRILOSEC) 40 MG capsule Take 1 capsule (40 mg total) by mouth daily. (Patient not taking: Reported on 02/07/2016) 30 capsule 5  . ondansetron (ZOFRAN ODT) 4 MG disintegrating tablet Take 1 tablet (4 mg total) by mouth every 8 (eight) hours as needed for nausea or vomiting. (Patient not taking: Reported on 02/07/2016) 20 tablet 0  . pantoprazole (PROTONIX) 20 MG tablet Take 1 tablet (20 mg total) by mouth daily. (Patient not taking: Reported on 02/07/2016) 14 tablet 0  . sertraline (ZOLOFT) 50 MG tablet Take 1 tablet (50 mg total) by mouth at bedtime. For depression (Patient not taking: Reported on 02/07/2016) 30 tablet 5   No facility-administered medications prior to visit.    ROS Review of Systems  Constitutional: Negative for activity change, appetite change and fatigue.  HENT: Negative for congestion, sinus pressure and sore throat.   Eyes: Negative for visual disturbance.  Respiratory: Negative for cough, chest tightness, shortness of breath and wheezing.   Cardiovascular: Negative for chest pain and palpitations.  Gastrointestinal: Negative for abdominal pain, constipation and abdominal distention.  Endocrine: Negative for polydipsia.  Genitourinary: Negative for dysuria and frequency.  Musculoskeletal: Negative for back  pain and arthralgias.  Skin: Negative for rash.  Neurological: Positive for headaches. Negative for tremors, light-headedness and numbness.  Hematological: Does not bruise/bleed easily.  Psychiatric/Behavioral: Negative for behavioral problems and agitation.    Objective:  BP 101/69 mmHg  Pulse 78  Temp(Src) 97.9 F (36.6 C) (Oral)  Resp 16  Ht 5\' 4"  (1.626 m)  Wt 128 lb (58.06 kg)  BMI 21.96 kg/m2  SpO2 97%  BP/Weight 02/19/2016 99991111 [id number]  Systolic BP 99991111 97 [id number]  Diastolic BP 69 68 84  Wt. (Lbs) 128 - -  BMI 21.96 - -      Physical Exam  Constitutional: She is oriented to person, place, and time. She appears well-developed and well-nourished.  Cardiovascular: Normal rate, normal heart sounds and intact distal pulses.   No murmur heard. Pulmonary/Chest: Effort normal and breath sounds normal. She has no wheezes. She has no rales. She exhibits no tenderness.  Abdominal: Soft. Bowel sounds are normal. She exhibits no distension and no mass. There is no tenderness.  Musculoskeletal: Normal range of motion.  Neurological: She is alert and oriented to person, place, and time.  Psychiatric:  Depressed: PH Q9 score of 21; GAD 7 score of 16   CMP Latest Ref Rng 02/07/2016 01/14/2016 01/14/2016  Glucose 65 - 99 mg/dL 81 81 87  BUN 6 - 20 mg/dL 15 12 13   Creatinine 0.44 - 1.00 mg/dL 0.86 0.70 0.82  Sodium 135 - 145 mmol/L 136 141 141  Potassium 3.5 - 5.1 mmol/L 4.4 3.3(L) 3.4(L)  Chloride 101 - 111 mmol/L 108 102 104  CO2 22 - 32 mmol/L 22 - 23  Calcium 8.9 - 10.3 mg/dL 9.6 - 10.0  Total Protein 6.5 - 8.1 g/dL - - 8.6(H)  Total Bilirubin 0.3 - 1.2 mg/dL - - 1.2  Alkaline Phos 38 - 126 U/L - - 42  AST 15 - 41 U/L - - 18  ALT 14 - 54 U/L - - 15       Assessment & Plan:   1. Seasonal allergic rhinitis due to pollen - cetirizine (ZYRTEC) 10 MG tablet; Take 1 tablet (10 mg total) by mouth daily as needed for allergies.  Dispense: 30 tablet; Refill: 3  2. Moderate  episode of recurrent major depressive disorder ([place]) This is secondary to her inability to work due to seizures and inability to drive. Patient has been provided with resources for therapy sessions.; Provided mobile crisis number Has an upcoming appointment with behavioral health in 03/2016 Commenced on Zoloft - sertraline (ZOLOFT) 50 MG tablet; Take 1 tablet (50 mg total) by mouth at bedtime. For depression  Dispense: 30 tablet; Refill: 1  3. 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  Dispense: 60 tablet; Refill: 0  4. Gastritis Currently symptomatic because she has been out of medications - pantoprazole (PROTONIX) 20 MG tablet; Take 1 tablet (20 mg total) by mouth daily.  Dispense: 30 tablet; Refill: 3 - bismuth subsalicylate (PEPTO-BISMOL) 262 MG/15ML suspension; Take 30 mLs by mouth every 6 (six) hours  as needed.  Dispense: 360 mL; Refill: 1  5. Asthma, mild persistent, uncomplicated No acute exacerbations - fluticasone furoate-vilanterol (BREO ELLIPTA) 200-25 MCG/INH AEPB; Inhale 1 puff into the lungs daily after breakfast. For Asthma  Dispense: 28 each; Refill: 3 - albuterol (PROVENTIL HFA;VENTOLIN HFA) 108 (90 Base) MCG/ACT inhaler; Inhale 2 puffs into the lungs every 4 (four) hours as needed for wheezing or shortness of breath.  Dispense: 1 Inhaler; Refill: 3   Meds ordered this encounter  Medications  . fluticasone furoate-vilanterol (BREO ELLIPTA) 200-25 MCG/INH AEPB    Sig: Inhale 1 puff into the lungs daily after breakfast. For Asthma    Dispense:  28 each    Refill:  3  . levETIRAcetam (KEPPRA) 1000 MG tablet    Sig: Take 1 tablet (1,000 mg total) by mouth 2 (two) times daily.    Dispense:  60 tablet    Refill:  0  .  cetirizine (ZYRTEC) 10 MG tablet    Sig: Take 1 tablet (10 mg total) by mouth daily as needed for allergies.    Dispense:  30 tablet    Refill:  3  . albuterol (PROVENTIL HFA;VENTOLIN HFA) 108 (90 Base) MCG/ACT inhaler    Sig: Inhale 2 puffs into the lungs every 4 (four) hours as needed for wheezing or shortness of breath.    Dispense:  1 Inhaler    Refill:  3  . pantoprazole (PROTONIX) 20 MG tablet    Sig: Take 1 tablet (20 mg total) by mouth daily.    Dispense:  30 tablet    Refill:  3  . bismuth subsalicylate (PEPTO-BISMOL) 262 MG/15ML suspension    Sig: Take 30 mLs by mouth every 6 (six) hours as needed.    Dispense:  360 mL    Refill:  1  . sertraline (ZOLOFT) 50 MG tablet    Sig: Take 1 tablet (50 mg total) by mouth at bedtime. For depression    Dispense:  30 tablet    Refill:  1    Follow-up: Return in about 1 month (around 03/20/2016) for Coordination of care.   Arnoldo Morale MD

## 2016-02-19 NOTE — Progress Notes (Signed)
Pt here for F/U for seizures. Pt denies any pain, but reports a headache. Pt states headache has been constant since May 7th when she had the seizure. Pt reports she only has proair inhaler, no other medications.

## 2016-02-19 NOTE — Patient Instructions (Signed)

## 2016-02-21 ENCOUNTER — Other Ambulatory Visit: Payer: Self-pay

## 2016-02-21 DIAGNOSIS — J4530 Mild persistent asthma, uncomplicated: Secondary | ICD-10-CM

## 2016-02-21 DIAGNOSIS — G40301 Generalized idiopathic epilepsy and epileptic syndromes, not intractable, with status epilepticus: Secondary | ICD-10-CM

## 2016-02-21 MED ORDER — LEVETIRACETAM 1000 MG PO TABS: 1000.0000 mg | ORAL_TABLET | Freq: Two times a day (BID) | ORAL | Status: DC

## 2016-02-21 MED ORDER — ALBUTEROL SULFATE HFA 108 (90 BASE) MCG/ACT IN AERS: 2.0000 | INHALATION_SPRAY | RESPIRATORY_TRACT | Status: AC | PRN

## 2016-02-21 MED ORDER — FLUTICASONE FUROATE-VILANTEROL 200-25 MCG/INH IN AEPB: 1.0000 | INHALATION_SPRAY | Freq: Every day | RESPIRATORY_TRACT | Status: DC

## 2016-03-05 ENCOUNTER — Ambulatory Visit (INDEPENDENT_AMBULATORY_CARE_PROVIDER_SITE_OTHER): Payer: PRIVATE HEALTH INSURANCE | Admitting: Neurology

## 2016-03-05 ENCOUNTER — Encounter: Payer: Self-pay | Admitting: Neurology

## 2016-03-05 VITALS — BP 112/74 | HR 86 | Ht 64.0 in | Wt 123.0 lb

## 2016-03-05 DIAGNOSIS — G40309 Generalized idiopathic epilepsy and epileptic syndromes, not intractable, without status epilepticus: Secondary | ICD-10-CM

## 2016-03-05 DIAGNOSIS — G40301 Generalized idiopathic epilepsy and epileptic syndromes, not intractable, with status epilepticus: Secondary | ICD-10-CM | POA: Diagnosis not present

## 2016-03-05 MED ORDER — LEVETIRACETAM 1000 MG PO TABS: 1000.0000 mg | ORAL_TABLET | Freq: Two times a day (BID) | ORAL | Status: DC

## 2016-03-05 MED ORDER — LEVETIRACETAM ER 500 MG PO TB24: 1000.0000 mg | ORAL_TABLET | Freq: Two times a day (BID) | ORAL | Status: DC

## 2016-03-05 MED ORDER — OXCARBAZEPINE 150 MG PO TABS: 150.0000 mg | ORAL_TABLET | Freq: Two times a day (BID) | ORAL | Status: DC

## 2016-03-05 NOTE — Patient Instructions (Addendum)
Overall you are doing fairly well but I do want to suggest a few things today:   Remember to drink plenty of fluid, eat healthy meals and do not skip any meals. Try to eat protein with a every meal and eat a healthy snack such as fruit or nuts in between meals. Try to keep a regular sleep-wake schedule and try to exercise daily, particularly in the form of walking, 20-30 minutes a day, if you can.   As far as your medications are concerned, I would like to suggest: Continue current medications  I would like to see you back in 4 months, sooner if we need to. Please call us with any interim questions, concerns, problems, updates or refill requests.   Please also call us for any test results so we can go over those with you on the phone.  My clinical assistant and will answer any of your questions and relay your messages to me and also relay most of my messages to you.   Our phone number is [phone]. We also have an after hours call service for urgent matters and there is a physician on-call for urgent questions. For any emergencies you know to call 911 or go to the nearest emergency room   
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
## 2016-03-06 DIAGNOSIS — G40309 Generalized idiopathic epilepsy and epileptic syndromes, not intractable, without status epilepticus: Secondary | ICD-10-CM | POA: Insufficient documentation

## 2016-03-11 ENCOUNTER — Telehealth: Payer: Self-pay | Admitting: *Deleted

## 2016-03-11 ENCOUNTER — Encounter (HOSPITAL_COMMUNITY): Payer: Self-pay | Admitting: Vascular Surgery

## 2016-03-11 ENCOUNTER — Emergency Department (HOSPITAL_COMMUNITY)
Admission: EM | Admit: 2016-03-11 | Discharge: 2016-03-11 | Disposition: A | Discharge status: Home / Self Care | Payer: PRIVATE HEALTH INSURANCE | Attending: Emergency Medicine | Admitting: Emergency Medicine

## 2016-03-11 DIAGNOSIS — F17210 Nicotine dependence, cigarettes, uncomplicated: Secondary | ICD-10-CM | POA: Insufficient documentation

## 2016-03-11 DIAGNOSIS — Z8673 Personal history of transient ischemic attack (TIA), and cerebral infarction without residual deficits: Secondary | ICD-10-CM | POA: Insufficient documentation

## 2016-03-11 DIAGNOSIS — R569 Unspecified convulsions: Secondary | ICD-10-CM

## 2016-03-11 DIAGNOSIS — J45909 Unspecified asthma, uncomplicated: Secondary | ICD-10-CM | POA: Insufficient documentation

## 2016-03-11 DIAGNOSIS — Z79899 Other long term (current) drug therapy: Secondary | ICD-10-CM | POA: Insufficient documentation

## 2016-03-11 DIAGNOSIS — G40909 Epilepsy, unspecified, not intractable, without status epilepticus: Secondary | ICD-10-CM | POA: Insufficient documentation

## 2016-03-11 LAB — ETHANOL

## 2016-03-11 LAB — RAPID URINE DRUG SCREEN, HOSP PERFORMED
AMPHETAMINES: NOT DETECTED
BARBITURATES: NOT DETECTED
BENZODIAZEPINES: POSITIVE — AB
COCAINE: NOT DETECTED
Opiates: NOT DETECTED
TETRAHYDROCANNABINOL: NOT DETECTED

## 2016-03-11 LAB — BASIC METABOLIC PANEL
ANION GAP: 5 (ref 5–15)
BUN: 9 mg/dL (ref 6–20)
CHLORIDE: 106 mmol/L (ref 101–111)
CO2: 25 mmol/L (ref 22–32)
Calcium: 9 mg/dL (ref 8.9–10.3)
Creatinine, Ser: 0.87 mg/dL (ref 0.44–1.00)
GFR calc Af Amer: >60 mL/min (ref >60)
GFR calc non Af Amer: >60 mL/min (ref >60)
GLUCOSE: 87 mg/dL (ref 65–99)
POTASSIUM: 3.6 mmol/L (ref 3.5–5.1)
Sodium: 136 mmol/L (ref 135–145)

## 2016-03-11 LAB — CBC WITH DIFFERENTIAL/PLATELET
BASOS ABS: 0 10*3/uL (ref 0.0–0.1)
Basophils Relative: 1 %
EOS PCT: 2 %
Eosinophils Absolute: 0.1 10*3/uL (ref 0.0–0.7)
HEMATOCRIT: 34.3 % — AB (ref 36.0–46.0)
HEMOGLOBIN: 11.4 g/dL — AB (ref 12.0–15.0)
LYMPHS ABS: 1.5 10*3/uL (ref 0.7–4.0)
LYMPHS PCT: 36 %
MCH: 30.4 pg (ref 26.0–34.0)
MCHC: 33.2 g/dL (ref 30.0–36.0)
MCV: 91.5 fL (ref 78.0–100.0)
Monocytes Absolute: 0.4 10*3/uL (ref 0.1–1.0)
Monocytes Relative: 9 %
NEUTROS ABS: 2.2 10*3/uL (ref 1.7–7.7)
Neutrophils Relative %: 52 %
PLATELETS: 219 10*3/uL (ref 150–400)
RBC: 3.75 MIL/uL — AB (ref 3.87–5.11)
RDW: 11.9 % (ref 11.5–15.5)
WBC: 4.2 10*3/uL (ref 4.0–10.5)

## 2016-03-11 LAB — PREGNANCY, URINE: Preg Test, Ur: NEGATIVE

## 2016-03-11 MED ORDER — OXCARBAZEPINE 150 MG PO TABS: 150.0000 mg | ORAL_TABLET | Freq: Two times a day (BID) | ORAL | Status: DC

## 2016-03-11 MED ORDER — SODIUM CHLORIDE 0.9 % IV SOLN
1000.0000 mg | Freq: Once | INTRAVENOUS | Status: AC
  Administered 2016-03-11: 1000 mg via INTRAVENOUS
  Filled 2016-03-11 (×2): qty 10

## 2016-03-11 MED ORDER — LORAZEPAM 2 MG/ML IJ SOLN
INTRAMUSCULAR | Status: AC
  Filled 2016-03-11: qty 1

## 2016-03-11 MED ORDER — OXCARBAZEPINE 300 MG PO TABS
150.0000 mg | ORAL_TABLET | Freq: Two times a day (BID) | ORAL | Status: DC
  Administered 2016-03-11: 150 mg via ORAL
  Filled 2016-03-11: qty 1

## 2016-03-11 MED ORDER — LORAZEPAM 2 MG/ML IJ SOLN
1.0000 mg | Freq: Once | INTRAMUSCULAR | Status: AC
  Administered 2016-03-11: 1 mg via INTRAVENOUS
  Filled 2016-03-11: qty 1

## 2016-03-11 MED ORDER — ACETAMINOPHEN 325 MG PO TABS
650.0000 mg | ORAL_TABLET | Freq: Once | ORAL | Status: AC
  Administered 2016-03-11: 650 mg via ORAL
  Filled 2016-03-11: qty 2

## 2016-03-11 NOTE — ED Notes (Signed)
Pharmacy called to send Keppra to pod A.

## 2016-03-11 NOTE — ED Notes (Signed)
Pt family member called to speak to this RN, does not understand why we are DC pt, explained why and why there was no reason to admit pt, pt able to stand, ambulatory, oriented, able to eat and drink. Pt family member threatened to "sue [facility]" if she goes home and has another seizure.

## 2016-03-11 NOTE — ED Notes (Signed)
Pt alert and oriented x 4 now after seizure.

## 2016-03-11 NOTE — Telephone Encounter (Signed)
error 

## 2016-03-11 NOTE — ED Notes (Signed)
This RN with pt, pt actively seizing. PA notified and in room. Airway intact. Pt deviated to the left.

## 2016-03-11 NOTE — Telephone Encounter (Signed)
Pt's friend called, to inform Dr Jaynee Eagles that pt had 4 seizures today, and  the medication is not working. Pt is at [facility].  Please call Junie Panning [phone]

## 2016-03-11 NOTE — Telephone Encounter (Signed)
Rn call Junie Panning, patients friend about pt having seizures today. Pt is currently at [facility] and is in the ED. Pt has not been admitted to the ED. Erin wanted to know if  Dr.Ahern will admit patient to hospital for seizures. Rn stated the MD who is treating the patient will make the final decision if she needs to be admitted or not. Junie Panning stated patients medications have been adjusted over the past year, and she is taking the medications. Rn stated Dr. Jaynee Eagles pt is in the ED. Rn stated once patient is discharge she can call back to schedule a ED follow up for the seizures. Erin verbalized understanding.

## 2016-03-11 NOTE — ED Provider Notes (Signed)
CSN: [id number]     Arrival date & time 03/11/16  1558 History   First MD Initiated Contact with Patient 03/11/16 1613     Chief Complaint  Patient presents with  . Seizures     (Consider location/radiation/quality/duration/timing/severity/associated sxs/prior Treatment) HPI   Patient is a 35 year old female with past medical history of seizures who presents the ED via EMS with complaint of seizures. EMS reports that family witnessed the patient having 4 seizures prior to arrival. EMS reports witnessing one seizure and reports the patient was postictal after seizure. On exam patient is sleeping but arousable. She denies any pain or complaints at this time. She notes she took her morning dose of Keppra but states she has not taken her afternoon dose which she is scheduled to take at 3 PM.  Level V caveat- Post-ictal behavior s/p seizure but A&Ox4.  Past Medical History  Diagnosis Date  . Epilepsy ([place])   . Asthma   . Stroke ([place])   . Seizures ([place])     last seizure 07-2015  . Stomach ulcer   . Fibroid tumor   . Anxiety   . Depression   . Migraine    Past Surgical History  Procedure Laterality Date  . Umbilical hernia repair  12/2014  . Prostate surgery     Family History  Problem Relation Age of Onset  . Heart failure Mother   . Cancer Father   . Seizures Father   . Drug abuse Sister   . Alcohol abuse Sister   . Alcohol abuse Sister   . Drug abuse Sister    Social History  Substance Use Topics  . Smoking status: Current Some Day Smoker -- 0.25 packs/day for 0 years    Types: Cigars  . Smokeless tobacco: Never Used     Comment: BLACK MILD  . Alcohol Use: 0.6 oz/week    1 Cans of beer per week     Comment: occ   OB History    Gravida Para Term Preterm AB TAB SAB Ectopic Multiple Living   0 0 0 0 0 0 0 0       Review of Systems  Neurological: Positive for seizures.  All other systems reviewed and are negative.     Allergies  Bee venom; Chocolate; Mosquito  (diagnostic); Orange fruit; Oregano; Penicillins; Dilantin; Garlic; Tramadol; and Tape  Home Medications   Prior to Admission medications   Medication Sig Start Date End Date Taking? Authorizing Provider  albuterol (PROVENTIL HFA;VENTOLIN HFA) 108 (90 Base) MCG/ACT inhaler Inhale 2 puffs into the lungs every 4 (four) hours as needed for wheezing or shortness of breath. 02/21/16   Arnoldo Morale, MD  ARIPiprazole (ABILIFY) 10 MG tablet Take 1 tablet (10 mg total) by mouth daily. For mood control Patient not taking: Reported on 02/07/2016 09/12/15   Encarnacion Slates, NP  bismuth subsalicylate (PEPTO-BISMOL) 262 MG/15ML suspension Take 30 mLs by mouth every 6 (six) hours as needed. 02/19/16   Arnoldo Morale, MD  cetirizine (ZYRTEC) 10 MG tablet Take 1 tablet (10 mg total) by mouth daily as needed for allergies. 02/19/16   Arnoldo Morale, MD  fluticasone furoate-vilanterol (BREO ELLIPTA) 200-25 MCG/INH AEPB Inhale 1 puff into the lungs daily after breakfast. For Asthma 02/21/16   Arnoldo Morale, MD  hydrOXYzine (ATARAX/VISTARIL) 25 MG tablet Take 1 tablet (25 mg total) by mouth every 6 (six) hours as needed for anxiety. Patient not taking: Reported on 02/07/2016 09/12/15   Encarnacion Slates, NP  levETIRAcetam (KEPPRA XR) 500 MG 24 hr tablet Take 2 tablets (1,000 mg total) by mouth 2 (two) times daily. 03/05/16   Melvenia Beam, MD  OXcarbazepine (TRILEPTAL) 150 MG tablet Take 1 tablet (150 mg total) by mouth 2 (two) times daily. 03/11/16   Nona Dell, PA-C  pantoprazole (PROTONIX) 20 MG tablet Take 1 tablet (20 mg total) by mouth daily. 02/19/16   Arnoldo Morale, MD  sertraline (ZOLOFT) 50 MG tablet Take 1 tablet (50 mg total) by mouth at bedtime. For depression 02/19/16   Arnoldo Morale, MD   BP 112/69 mmHg  Pulse 83  Temp(Src) 98.4 F (36.9 C) (Oral)  Resp 17  SpO2 98% Physical Exam  Constitutional: She is oriented to person, place, and time. She appears well-developed and well-nourished. No distress.  HENT:   Head: Normocephalic and atraumatic. Head is without raccoon's eyes, without Battle's sign, without abrasion, without contusion and without laceration.  Right Ear: Tympanic membrane normal.  Left Ear: Tympanic membrane normal.  Nose: Nose normal. No rhinorrhea, nose lacerations, sinus tenderness, nasal deformity, septal deviation or nasal septal hematoma. No epistaxis.  Mouth/Throat: Uvula is midline, oropharynx is clear and moist and mucous membranes are normal. No oral lesions. No lacerations. No oropharyngeal exudate, posterior oropharyngeal edema, posterior oropharyngeal erythema or tonsillar abscesses.  No bite marks  Eyes: Conjunctivae and EOM are normal. Pupils are equal, round, and reactive to light. Right eye exhibits no discharge. Left eye exhibits no discharge. No scleral icterus.  Neck: Normal range of motion. Neck supple.  Cardiovascular: Normal rate, regular rhythm, normal heart sounds and intact distal pulses.   Pulmonary/Chest: Effort normal and breath sounds normal. No respiratory distress. She has no wheezes. She has no rales. She exhibits no tenderness.  Abdominal: Soft. Bowel sounds are normal. She exhibits no distension and no mass. There is no tenderness. There is no rebound and no guarding.  Musculoskeletal: Normal range of motion. She exhibits no edema.  Full range of motion of bilateral upper and lower extremities with 5 out of 5 strength. 2+ radial and PT pulses. Sensation grossly intact.  Lymphadenopathy:    She has no cervical adenopathy.  Neurological: She is alert and oriented to person, place, and time. She has normal strength. No cranial nerve deficit or sensory deficit. Coordination normal.  Skin: Skin is warm and dry. She is not diaphoretic.  Nursing note and vitals reviewed.   ED Course  Procedures (including critical care time) Labs Review Labs Reviewed  CBC WITH DIFFERENTIAL/PLATELET - Abnormal; Notable for the following:    RBC 3.75 (*)    Hemoglobin  11.4 (*)    HCT 34.3 (*)    All other components within normal limits  URINE RAPID DRUG SCREEN, HOSP PERFORMED - Abnormal; Notable for the following:    Benzodiazepines POSITIVE (*)    All other components within normal limits  BASIC METABOLIC PANEL  PREGNANCY, URINE  ETHANOL    Imaging Review No results found. I have personally reviewed and evaluated these images and lab results as part of my medical decision-making.   EKG Interpretation None      MDM   Final diagnoses:  Seizure ([place])    Pt presents s/p witnessed seizure. Hx of seizures. VSS. Pt postictal behavior on exam, Alert and oriented 4. No evidence of head injury on exam. Remaining exam unremarkable. Patient given Ativan and Keppra dose in the ED. Labs unremarkable.  On reevaluation, patient is alert and oriented 4 with normal behavior. Patient  with no evidence of focal neuro deficits on physical exam and is at mental baseline. Pt reports she has been taking her Keppra as prescribed but reports she has not been on taking Trileptal for the past 2 months. Chart review shows patient has been noncompliant with her seizure medications. Patient able to tolerate by mouth in the ED. Plan to discharge patient home with prescription for Trileptal and advised patient to continue taking her Keppra as prescribed. Advised patient to follow up with her neurologist. Discussed return precautions with patient.      Chesley Noon [place], [place] 03/12/16 0041  Forde Dandy, MD 03/12/16 1359

## 2016-03-11 NOTE — Discharge Instructions (Signed)
Continue taking your prescriptions of Keppra and Trileptal as prescribed. You may also take Tylenol as prescribed over-the-counter as needed for your headaches. Continue drinking fluids at home to remain hydrated. I recommend following up with your neurologist, Dr. Vertell Limber, within the next 1-2 weeks for follow-up. Please return to the Emergency Department if symptoms worsen or new onset of fever, headache, visual changes, lightheadedness, dizziness, neck stiffness, chest pain, shortness of breath, numbness, tingling, weakness, syncope, seizure.

## 2016-03-11 NOTE — ED Notes (Signed)
Pt reports to the ED via [facility] from home for eval of seizure like activity. Family reports 4 seizures PTA and then one time witnessed by EMS. Pt post ictal on arrival. No oral trauma or incontinence noted. Pt complaining of a HA and fatigue at this time. Is on Keppra which she recently had adjusted. She was unable to pick up the new rx but she was taking her old dose of Keppra. Pt lethargic, resp e/u, and skin warm and dry.

## 2016-03-11 NOTE — ED Notes (Signed)
Pt noted to be crying out from room. Upon entering patient was having full body seizure-like activity. Airway protected. Seizure lasted approx 1 minute. ED PA aware and at bedside. She was able to speak with the patient and solicit a response. 2 mg of Ativan overrode during seizure however by the time it was obtained patient had stopped seizing. VSS. Pt post ictal.

## 2016-03-12 ENCOUNTER — Other Ambulatory Visit: Payer: Self-pay | Admitting: Neurology

## 2016-03-12 ENCOUNTER — Telehealth: Payer: Self-pay | Admitting: Neurology

## 2016-03-12 MED ORDER — LEVETIRACETAM ER 500 MG PO TB24: 1500.0000 mg | ORAL_TABLET | Freq: Every day | ORAL | Status: DC

## 2016-03-12 NOTE — Telephone Encounter (Signed)
Tina [name], please call patient. Let her know that a new prescription is waiting for her at the pharmacy. I am going to increase her Keppra. I sent her in a new dose, 500mg  per pill and when she picks up the new prescription she should take 3 pills in the morning and 3 pills at night. Schedule a follow up with patient and ask her to bring her new prescription with her please so we can discuss. Her current keppra is 1000mg  per pill and she take 1 in themorning and 1 at night. Please point out the new prescription is 500mg  per pill which is why in the new prescription she has to take more pills. So at 500mg x3 pills twice daily she will get 1500mg  twice daily at the new prescription. I want her to understand about the new prescription changes. thanks

## 2016-03-12 NOTE — Telephone Encounter (Addendum)
Dr Jaynee Eagles- FYI Called pt. Relayed Dr Jaynee Eagles message below. She verbalized understanding. I had her repeat new instructions to me to make sure she had a good understanding on how Dr Jaynee Eagles wants her to take it; 3 tablets (total 1500mg ) 2 times daily. Advised it was sent to her pharmacy for pick up.  Told her to call if she has further questions/concerns or if she has further seizures. She stated she was feeling better today, just tired.

## 2016-03-15 ENCOUNTER — Encounter (HOSPITAL_COMMUNITY): Payer: Self-pay

## 2016-03-15 ENCOUNTER — Inpatient Hospital Stay (HOSPITAL_COMMUNITY)
Admission: EM | Admit: 2016-03-15 | Discharge: 2016-03-18 | DRG: 101 | Disposition: A | Discharge status: Home / Self Care | Payer: Self-pay | Attending: Internal Medicine | Admitting: Internal Medicine

## 2016-03-15 DIAGNOSIS — Y907 Blood alcohol level of 200-239 mg/100 ml: Secondary | ICD-10-CM | POA: Diagnosis present

## 2016-03-15 DIAGNOSIS — F10129 Alcohol abuse with intoxication, unspecified: Secondary | ICD-10-CM | POA: Diagnosis present

## 2016-03-15 DIAGNOSIS — F1010 Alcohol abuse, uncomplicated: Secondary | ICD-10-CM

## 2016-03-15 DIAGNOSIS — G40911 Epilepsy, unspecified, intractable, with status epilepticus: Secondary | ICD-10-CM

## 2016-03-15 DIAGNOSIS — F1110 Opioid abuse, uncomplicated: Secondary | ICD-10-CM | POA: Diagnosis present

## 2016-03-15 DIAGNOSIS — Z79899 Other long term (current) drug therapy: Secondary | ICD-10-CM

## 2016-03-15 DIAGNOSIS — R9431 Abnormal electrocardiogram [ECG] [EKG]: Secondary | ICD-10-CM | POA: Diagnosis present

## 2016-03-15 DIAGNOSIS — Z789 Other specified health status: Secondary | ICD-10-CM

## 2016-03-15 DIAGNOSIS — Z9114 Patient's other noncompliance with medication regimen: Secondary | ICD-10-CM

## 2016-03-15 DIAGNOSIS — D72829 Elevated white blood cell count, unspecified: Secondary | ICD-10-CM | POA: Diagnosis present

## 2016-03-15 DIAGNOSIS — G40901 Epilepsy, unspecified, not intractable, with status epilepticus: Secondary | ICD-10-CM | POA: Diagnosis present

## 2016-03-15 DIAGNOSIS — G40909 Epilepsy, unspecified, not intractable, without status epilepticus: Principal | ICD-10-CM | POA: Diagnosis present

## 2016-03-15 DIAGNOSIS — R569 Unspecified convulsions: Secondary | ICD-10-CM

## 2016-03-15 DIAGNOSIS — Z7289 Other problems related to lifestyle: Secondary | ICD-10-CM

## 2016-03-15 DIAGNOSIS — E876 Hypokalemia: Secondary | ICD-10-CM | POA: Diagnosis present

## 2016-03-15 DIAGNOSIS — I4581 Long QT syndrome: Secondary | ICD-10-CM | POA: Diagnosis present

## 2016-03-15 DIAGNOSIS — R400 Somnolence: Secondary | ICD-10-CM | POA: Diagnosis present

## 2016-03-15 DIAGNOSIS — E162 Hypoglycemia, unspecified: Secondary | ICD-10-CM | POA: Diagnosis present

## 2016-03-15 DIAGNOSIS — F1210 Cannabis abuse, uncomplicated: Secondary | ICD-10-CM | POA: Diagnosis present

## 2016-03-15 LAB — BASIC METABOLIC PANEL
Anion gap: 6 (ref 5–15)
BUN: 5 mg/dL — AB (ref 6–20)
CALCIUM: 8.1 mg/dL — AB (ref 8.9–10.3)
CO2: 20 mmol/L — AB (ref 22–32)
Chloride: 110 mmol/L (ref 101–111)
Creatinine, Ser: 0.75 mg/dL (ref 0.44–1.00)
GFR calc Af Amer: >60 mL/min (ref >60)
GLUCOSE: 77 mg/dL (ref 65–99)
Potassium: 3.8 mmol/L (ref 3.5–5.1)
Sodium: 136 mmol/L (ref 135–145)

## 2016-03-15 LAB — COMPREHENSIVE METABOLIC PANEL
ALBUMIN: 3.7 g/dL (ref 3.5–5.0)
ALK PHOS: 38 U/L (ref 38–126)
ALT: 14 U/L (ref 14–54)
AST: 17 U/L (ref 15–41)
Anion gap: 11 (ref 5–15)
BILIRUBIN TOTAL: 0.4 mg/dL (ref 0.3–1.2)
BUN: 8 mg/dL (ref 6–20)
CALCIUM: 8.5 mg/dL — AB (ref 8.9–10.3)
CO2: 22 mmol/L (ref 22–32)
CREATININE: 0.79 mg/dL (ref 0.44–1.00)
Chloride: 108 mmol/L (ref 101–111)
GFR calc Af Amer: >60 mL/min (ref >60)
GLUCOSE: 128 mg/dL — AB (ref 65–99)
POTASSIUM: 2.7 mmol/L — AB (ref 3.5–5.1)
Sodium: 141 mmol/L (ref 135–145)
TOTAL PROTEIN: 6.5 g/dL (ref 6.5–8.1)

## 2016-03-15 LAB — CBC WITH DIFFERENTIAL/PLATELET
BASOS ABS: 0 10*3/uL (ref 0.0–0.1)
BASOS PCT: 0 %
EOS ABS: 0 10*3/uL (ref 0.0–0.7)
EOS PCT: 0 %
HCT: 33.5 % — ABNORMAL LOW (ref 36.0–46.0)
Hemoglobin: 11.1 g/dL — ABNORMAL LOW (ref 12.0–15.0)
Lymphocytes Relative: 36 %
Lymphs Abs: 3.8 10*3/uL (ref 0.7–4.0)
MCH: 30.9 pg (ref 26.0–34.0)
MCHC: 33.1 g/dL (ref 30.0–36.0)
MCV: 93.3 fL (ref 78.0–100.0)
MONO ABS: 0.5 10*3/uL (ref 0.1–1.0)
Monocytes Relative: 5 %
Neutro Abs: 6.4 10*3/uL (ref 1.7–7.7)
Neutrophils Relative %: 59 %
PLATELETS: 230 10*3/uL (ref 150–400)
RBC: 3.59 MIL/uL — AB (ref 3.87–5.11)
RDW: 12 % (ref 11.5–15.5)
WBC: 10.8 10*3/uL — AB (ref 4.0–10.5)

## 2016-03-15 LAB — RAPID URINE DRUG SCREEN, HOSP PERFORMED
AMPHETAMINES: NOT DETECTED
Barbiturates: NOT DETECTED
Benzodiazepines: NOT DETECTED
Cocaine: NOT DETECTED
OPIATES: POSITIVE — AB
Tetrahydrocannabinol: POSITIVE — AB

## 2016-03-15 LAB — MAGNESIUM: MAGNESIUM: 1.9 mg/dL (ref 1.7–2.4)

## 2016-03-15 LAB — CBG MONITORING, ED: Glucose-Capillary: 133 mg/dL — ABNORMAL HIGH (ref 65–99)

## 2016-03-15 LAB — I-STAT BETA HCG BLOOD, ED (MC, WL, AP ONLY)

## 2016-03-15 LAB — SALICYLATE LEVEL

## 2016-03-15 LAB — ACETAMINOPHEN LEVEL: ACETAMINOPHEN (TYLENOL), SERUM: 12 ug/mL (ref 10–30)

## 2016-03-15 LAB — ETHANOL: ALCOHOL ETHYL (B): 239 mg/dL — AB (ref <5)

## 2016-03-15 LAB — MRSA PCR SCREENING: MRSA by PCR: NEGATIVE

## 2016-03-15 MED ORDER — LEVETIRACETAM 750 MG PO TABS: 1500.0000 mg | ORAL_TABLET | Freq: Two times a day (BID) | ORAL | Status: DC

## 2016-03-15 MED ORDER — LORAZEPAM 2 MG/ML IJ SOLN
INTRAMUSCULAR | Status: AC
  Filled 2016-03-15: qty 1

## 2016-03-15 MED ORDER — SODIUM CHLORIDE 0.9 % IV SOLN
100.0000 mg | Freq: Two times a day (BID) | INTRAVENOUS | Status: DC
  Administered 2016-03-16 – 2016-03-18 (×5): 100 mg via INTRAVENOUS
  Filled 2016-03-15 (×10): qty 10

## 2016-03-15 MED ORDER — POTASSIUM CHLORIDE 10 MEQ/100ML IV SOLN
10.0000 meq | Freq: Once | INTRAVENOUS | Status: AC
  Administered 2016-03-15: 10 meq via INTRAVENOUS
  Filled 2016-03-15: qty 100

## 2016-03-15 MED ORDER — POTASSIUM CHLORIDE 10 MEQ/100ML IV SOLN
10.0000 meq | INTRAVENOUS | Status: AC
  Administered 2016-03-15 (×5): 10 meq via INTRAVENOUS
  Filled 2016-03-15 (×6): qty 100

## 2016-03-15 MED ORDER — SODIUM CHLORIDE 0.9 % IV SOLN
200.0000 mg | Freq: Once | INTRAVENOUS | Status: AC
  Administered 2016-03-16: 200 mg via INTRAVENOUS
  Filled 2016-03-15: qty 20

## 2016-03-15 MED ORDER — LORAZEPAM 2 MG/ML IJ SOLN: 1.0000 mg | Freq: Once | INTRAMUSCULAR | Status: DC

## 2016-03-15 MED ORDER — SODIUM CHLORIDE 0.9% FLUSH
3.0000 mL | Freq: Two times a day (BID) | INTRAVENOUS | Status: DC
  Administered 2016-03-15 – 2016-03-18 (×7): 3 mL via INTRAVENOUS

## 2016-03-15 MED ORDER — SODIUM CHLORIDE 0.9 % IV SOLN
1000.0000 mg | Freq: Once | INTRAVENOUS | Status: AC
  Administered 2016-03-15: 1000 mg via INTRAVENOUS
  Filled 2016-03-15: qty 10

## 2016-03-15 MED ORDER — LEVETIRACETAM 500 MG/5ML IV SOLN
1500.0000 mg | Freq: Two times a day (BID) | INTRAVENOUS | Status: DC
  Administered 2016-03-15 – 2016-03-18 (×6): 1500 mg via INTRAVENOUS
  Filled 2016-03-15 (×7): qty 15

## 2016-03-15 MED ORDER — SODIUM CHLORIDE 0.45 % IV SOLN
INTRAVENOUS | Status: AC
  Administered 2016-03-15: 11:00:00 via INTRAVENOUS
  Filled 2016-03-15 (×2): qty 1000

## 2016-03-15 MED ORDER — ENOXAPARIN SODIUM 40 MG/0.4ML ~~LOC~~ SOLN
40.0000 mg | SUBCUTANEOUS | Status: DC
  Administered 2016-03-16 – 2016-03-17 (×2): 40 mg via SUBCUTANEOUS
  Filled 2016-03-15 (×2): qty 0.4

## 2016-03-15 MED ORDER — SODIUM CHLORIDE 0.45 % IV SOLN: INTRAVENOUS | Status: DC

## 2016-03-15 MED ORDER — LORAZEPAM 2 MG/ML IJ SOLN
1.0000 mg | Freq: Once | INTRAMUSCULAR | Status: AC
  Administered 2016-03-15: 1 mg via INTRAVENOUS
  Filled 2016-03-15: qty 1

## 2016-03-15 MED ORDER — LORAZEPAM 2 MG/ML IJ SOLN
1.0000 mg | INTRAMUSCULAR | Status: DC | PRN
  Administered 2016-03-15 – 2016-03-16 (×4): 1 mg via INTRAVENOUS
  Filled 2016-03-15 (×4): qty 1

## 2016-03-15 MED ORDER — LORAZEPAM 2 MG/ML IJ SOLN
1.0000 mg | Freq: Once | INTRAMUSCULAR | Status: AC
  Administered 2016-03-15: 1 mg via INTRAVENOUS

## 2016-03-15 MED ORDER — ACETAMINOPHEN 325 MG PO TABS
650.0000 mg | ORAL_TABLET | Freq: Once | ORAL | Status: AC
  Administered 2016-03-16: 650 mg via ORAL
  Filled 2016-03-15: qty 2

## 2016-03-15 NOTE — ED Provider Notes (Signed)
CSN: [id number]     Arrival date & time 03/15/16  0612 History   First MD Initiated Contact with Patient 03/15/16 [phone]     Chief Complaint - unresponsive  LEVEL 5 CAVEAT - PATIENT UNRESPONSIVE/ACUITY OF CONDITION  Patient is a 35 y.o. female presenting with altered mental status. The history is provided by a relative.  Altered Mental Status Severity:  Severe Most recent episode:  Today Duration: unknown. Timing:  Constant Progression:  Worsening Chronicity:  New Patient presents with episode of unresponsiveness Patient brought to the ED by a family member who states he is her cousin but has not seen her recently He reports she may have had a seizure but he is unsure No other details are known  PMH - unknown Soc hx - unknown Social History  Substance Use Topics  . Smoking status: Not on file  . Smokeless tobacco: Not on file  . Alcohol Use: Not on file   OB History    No data available     Review of Systems  Unable to perform ROS: Acuity of condition      Allergies  Review of patient's allergies indicates not on file.  Home Medications   Prior to Admission medications   Not on File   BP 105/71 mmHg  Pulse 99  Temp(Src) 97.3 F (36.3 C) (Rectal)  Resp 12  SpO2 100% Physical Exam CONSTITUTIONAL: ill appearing HEAD: Normocephalic/atraumatic, no visible trauma EYES: PERRL ENMT: Mucous membranes moist, no obvious lacerations or bleeding noted NECK: supple no meningeal signs SPINE/BACK:no bruising noted to back CV: S1/S2 noted, no murmurs/rubs/gallops noted LUNGS: decreased BS noted bilaterally, pt is bradypneic ABDOMEN: soft, nondistended NEURO: Pt is unresponsive.  She does not respond to voice or painful stimuli EXTREMITIES: pulses normal/equal, no signs of trauma, no deformities noted SKIN: warm, color normal PSYCH: unable to assess  ED Course  Procedures  CRITICAL CARE Performed by: Sharyon Cable Total critical care time: 33 minutes Critical  care time was exclusive of separately billable procedures and treating other patients. Critical care was necessary to treat or prevent imminent or life-threatening deterioration. Critical care was time spent personally by me on the following activities: development of treatment plan with patient and/or surrogate as well as nursing, discussions with consultants, evaluation of patient's response to treatment, examination of patient, obtaining history from patient or surrogate, ordering and performing treatments and interventions, ordering and review of laboratory studies, ordering and review of radiographic studies, pulse oximetry and re-evaluation of patient's condition. PATIENT WITH MULTIPLE SEIZURES IN THE ED REQUIRING ATIVAN AND KEPPRA  6:32 AM I initially saw patient lying in the floor of the waiting room. Apparently she was brought in by wheelchair and she slid into floor.  She had some brief body jerking then was unresponsive.  She had spontaneous respiratory effort but was decreased.  We moved her to a stretcher and into a room in the ED.  She never lost cardiac activity Her vitals signs were appropriate.  She was afebrile.  She is not hypoglycemic She began to wake up and could recall her name No visible signs of trauma I was able to review her chart and she has 11 ER visits in 6 months with h/o frequent seizures She is supposed to be taking keppra 1500mg /daily Labs pending at this time 6:41 AM I was called back to room as patient was having a seizure She was on side with rigid body movements that terminated spontaneously I have ordered ativan and keppra 7:15  AM Pt continues to have episode of muscle rigidity that will terminate spontaneously and she will then appear confused and start crying She is protecting her airway I have consulted neurology to see patient  Pt will need admission 7:44 AM Pt found to have etoh intox Also hypokalemic with EKG changes Add on magnesium level Pt  stable at this time I spoke to internal medicine service will admit to stepdown unit  Labs Review Labs Reviewed  COMPREHENSIVE METABOLIC PANEL - Abnormal; Notable for the following:    Potassium 2.7 (*)    Glucose, Bld 128 (*)    Calcium 8.5 (*)    All other components within normal limits  CBC WITH DIFFERENTIAL/PLATELET - Abnormal; Notable for the following:    WBC 10.8 (*)    RBC 3.59 (*)    Hemoglobin 11.1 (*)    HCT 33.5 (*)    All other components within normal limits  ETHANOL - Abnormal; Notable for the following:    Alcohol, Ethyl (B) 239 (*)    All other components within normal limits  CBG MONITORING, ED - Abnormal; Notable for the following:    Glucose-Capillary 133 (*)    All other components within normal limits  ACETAMINOPHEN LEVEL  SALICYLATE LEVEL  URINE RAPID DRUG SCREEN, HOSP PERFORMED  MAGNESIUM  I-STAT BETA HCG BLOOD, ED ([facility], [facility], [facility] ONLY)    I have personally reviewed and evaluated these lab results as part of my medical decision-making.  ED ECG REPORT   Date: 03/15/2016 0618am  Rate: 100  Rhythm: sinus tachycardia  QRS Axis: normal  Intervals: QT prolonged  ST/T Wave abnormalities: nonspecific ST changes  Conduction Disutrbances:none  Narrative Interpretation:    I have personally reviewed the EKG tracing and agree with the computerized printout as noted.   Medications  potassium chloride 10 mEq in 100 mL IVPB (not administered)  LORazepam (ATIVAN) injection 1 mg (1 mg Intravenous Given 03/15/16 0639)  levETIRAcetam (KEPPRA) 1,000 mg in sodium chloride 0.9 % 100 mL IVPB (0 mg Intravenous Stopped 03/15/16 0712)  LORazepam (ATIVAN) injection 1 mg (1 mg Intravenous Given 03/15/16 0712)     MDM   Final diagnoses:  Intractable epilepsy with status epilepticus, unspecified epilepsy type ([place])  Alcohol abuse  Hypokalemia  Prolonged Q-T interval on ECG    Nursing notes including past medical history and social history reviewed and considered in  documentation Previous records reviewed and considered Labs/vital reviewed myself and considered during evaluation     Ripley Fraise, MD 03/15/16 [phone]

## 2016-03-15 NOTE — Progress Notes (Signed)
Patient just had another seizure. Posturing; turned to right side; abnormal breathing pattern but maintaining 98% sat on 2L [place].  1631 pt responsive to name unable to recall location and date.

## 2016-03-15 NOTE — Consult Note (Signed)
Reason for Consult:Seizures Referring Physician: ER  Tina [name] is an 35 y.o. female.  HPI: Presents with 2 GTCS in the ER and given Ativan 2 mg IV and Keppra 1000 mg IV load.  She is supposed to be on Keppra at home, but currently very post-ictal and cannot give me details on compliance.  Her blood alcohol level was 0.238.  UDS is pending.  Past Medical History  Diagnosis Date  . Seizures ([place])     History reviewed. No pertinent past surgical history.  No family history on file.  Social History:  reports that she has been smoking.  She does not have any smokeless tobacco history on file. She reports that she drinks alcohol. Her drug history is not on file.  Allergies:  Allergies  Allergen Reactions  . Latex   . Penicillins   . Tramadol     Prior to Admission medications   Not on File    Medications: Prior to Admission:  (Not in a hospital admission)  Results for orders placed or performed during the hospital encounter of 03/15/16 (from the past 48 hour(s))  CBG monitoring, ED     Status: Abnormal   Collection Time: 03/15/16  6:15 AM  Result Value Ref Range   Glucose-Capillary 133 (H) 65 - 99 mg/dL  Comprehensive metabolic panel     Status: Abnormal   Collection Time: 03/15/16  6:24 AM  Result Value Ref Range   Sodium 141 135 - 145 mmol/L   Potassium 2.7 (LL) 3.5 - 5.1 mmol/L    Comment: CRITICAL RESULT CALLED TO, READ BACK BY AND VERIFIED WITH: BEALE,M RN 0719 7.1.17 MCADOO,G    Chloride 108 101 - 111 mmol/L   CO2 22 22 - 32 mmol/L   Glucose, Bld 128 (H) 65 - 99 mg/dL   BUN 8 6 - 20 mg/dL   Creatinine, Ser 0.79 0.44 - 1.00 mg/dL   Calcium 8.5 (L) 8.9 - 10.3 mg/dL   Total Protein 6.5 6.5 - 8.1 g/dL   Albumin 3.7 3.5 - 5.0 g/dL   AST 17 15 - 41 U/L   ALT 14 14 - 54 U/L   Alkaline Phosphatase 38 38 - 126 U/L   Total Bilirubin 0.4 0.3 - 1.2 mg/dL   GFR calc non Af Amer >60 >60 mL/min   GFR calc Af Amer >60 >60 mL/min    Comment: (NOTE) The eGFR  has been calculated using the CKD EPI equation. This calculation has not been validated in all clinical situations. eGFR's persistently <60 mL/min signify possible Chronic Kidney Disease.    Anion gap 11 5 - 15  CBC with Differential/Platelet     Status: Abnormal   Collection Time: 03/15/16  6:24 AM  Result Value Ref Range   WBC 10.8 (H) 4.0 - 10.5 K/uL   RBC 3.59 (L) 3.87 - 5.11 MIL/uL   Hemoglobin 11.1 (L) 12.0 - 15.0 g/dL   HCT 33.5 (L) 36.0 - 46.0 %   MCV 93.3 78.0 - 100.0 fL   MCH 30.9 26.0 - 34.0 pg   MCHC 33.1 30.0 - 36.0 g/dL   RDW 12.0 11.5 - 15.5 %   Platelets 230 150 - 400 K/uL   Neutrophils Relative % 59 %   Neutro Abs 6.4 1.7 - 7.7 K/uL   Lymphocytes Relative 36 %   Lymphs Abs 3.8 0.7 - 4.0 K/uL   Monocytes Relative 5 %   Monocytes Absolute 0.5 0.1 - 1.0 K/uL   Eosinophils Relative 0 %  Eosinophils Absolute 0.0 0.0 - 0.7 K/uL   Basophils Relative 0 %   Basophils Absolute 0.0 0.0 - 0.1 K/uL  Ethanol     Status: Abnormal   Collection Time: 03/15/16  6:24 AM  Result Value Ref Range   Alcohol, Ethyl (B) 239 (H) <5 mg/dL    Comment:        LOWEST DETECTABLE LIMIT FOR SERUM ALCOHOL IS 5 mg/dL FOR MEDICAL PURPOSES ONLY   Acetaminophen level     Status: None   Collection Time: 03/15/16  6:24 AM  Result Value Ref Range   Acetaminophen (Tylenol), Serum 12 10 - 30 ug/mL    Comment:        THERAPEUTIC CONCENTRATIONS VARY SIGNIFICANTLY. A RANGE OF 10-30 ug/mL MAY BE AN EFFECTIVE CONCENTRATION FOR MANY PATIENTS. HOWEVER, SOME ARE BEST TREATED AT CONCENTRATIONS OUTSIDE THIS RANGE. ACETAMINOPHEN CONCENTRATIONS >150 ug/mL AT 4 HOURS AFTER INGESTION AND >50 ug/mL AT 12 HOURS AFTER INGESTION ARE OFTEN ASSOCIATED WITH TOXIC REACTIONS.   Salicylate level     Status: None   Collection Time: 03/15/16  6:24 AM  Result Value Ref Range   Salicylate Lvl <9.4 2.8 - 30.0 mg/dL  Magnesium     Status: None   Collection Time: 03/15/16  6:24 AM  Result Value Ref Range    Magnesium 1.9 1.7 - 2.4 mg/dL  I-Stat Beta hCG blood, ED ([facility], [facility], [facility] only)     Status: None   Collection Time: 03/15/16  6:31 AM  Result Value Ref Range   I-stat hCG, quantitative <5.0 <5 mIU/mL   Comment 3            Comment:   GEST. AGE      CONC.  (mIU/mL)   <=1 WEEK        5 - 50     2 WEEKS       50 - 500     3 WEEKS       100 - 10,000     4 WEEKS     1,000 - 30,000        FEMALE AND NON-PREGNANT FEMALE:     LESS THAN 5 mIU/mL     No results found.  ROS Blood pressure 101/69, pulse 93, temperature 97.3 F (36.3 C), temperature source Rectal, resp. rate 15, SpO2 100 %. Neurologic Examination: Very lethargic, but opens eyes to stimulation and answers basic questions.  Moves all 4 extremities equally.  No babinski.  No hoffman's.    Assessment/Plan:  History of epilepsy and presents with recurrent seizures.  Currently too post-ictal to get any history or get compliance info.  Acute alcohol intoxication can lower seizure threshold but awaiting other drug screen for cocaine or amphetamines.  Resume Keppra 1500 mg bid when more awake.  No need for EEG or imaging at this time.   Rogue Jury, MD 03/15/2016, 8:03 AM

## 2016-03-15 NOTE — Progress Notes (Signed)
Patient seizing. Lasted approx 30 seconds. Rigid/ shaking movements/ posturing. Turning to right side. Abnormal breathing with O2 sats maintained on 2L . Responsive to name

## 2016-03-15 NOTE — ED Notes (Signed)
Pt brought in by cousin, wheeled in thru waiting area. Slumped out of wheelchair and into floor, unresponsive and displaying rigid movement. Cousin admits to seizure hx but doesn't know how often she's had prior seizure or medications. Pt lifted onto stretcher and wheeled into A02 where she continued to display rigid seizure-like activity and minimally responsive to verbal questioning.

## 2016-03-15 NOTE — Progress Notes (Signed)
Patient just had another seizure. Last approx 30 secs. IV keppra infusing. Abnormal breathing; O2 sats maintained.

## 2016-03-15 NOTE — Progress Notes (Signed)
Another seizure followed the previous by ~5 minutes. This one lasted ~30 seconds and followed the same tonic/clonic course with no change in vital signs.  1 mg Ativan given. Patient alert and able to respond appropriately following the event. Pt is sad and tearful at this time. Emotional support given. Will continue to monitor.

## 2016-03-15 NOTE — Progress Notes (Signed)
Patient seizing. Lasted approx 30 seconds. Rigid/ shaking movements/ posturing. Turning to right side. Abnormal breathing with O2 sats maintained on 2L [place]. Responsive to name

## 2016-03-15 NOTE — ED Notes (Signed)
CRITICAL VALUE ALERT  Critical value received:  K  Date of notification:  03/16/15  Time of notification:  0720  Critical value read back:yes  MD notified: Christy Gentles

## 2016-03-15 NOTE — Progress Notes (Signed)
Patient had a seizure that lasted 1 min; rigid/ shaking movements; turning to left side. Dr. Arcelia Jew paged. VSS. Respirations are even and unlabored with symmetrical chest expansion on room air. Pt responsive to name post-ictal. Upon assessment, patient stated she can tell when her seizures are about to come. She stated she starts to feel a "weird sensation" in her hands and her head starts hurting. Prior to seizure she did stretch out her hand and moving her fingers before convulsing.

## 2016-03-15 NOTE — Progress Notes (Signed)
Patient actively seizing. 1mg  ativan given IV. Seizure lasted 30 secs.

## 2016-03-15 NOTE — Progress Notes (Signed)
Pt experienced another seizure. Episode duration ~1 minute. Breathing labored during event however O2 sats remained at 100%.  Vtal signs stable. Pt alert and oriented to self following the event. Will continue to monitor and intervene PRN as appropriate.

## 2016-03-15 NOTE — Progress Notes (Signed)
Dr. Ellyn Hack and Dr. Arcelia Jew responded at bedside. Pt post-ictal with IV keppra still infusing.

## 2016-03-15 NOTE — Progress Notes (Addendum)
Notified by nursing that patient had another seizure around 3:30 PM that lasted for 1 minute. She did not require any Ativan. I personally evaluated her at the bedside and she is more alert and oriented compared to admission. She was able to tell me that she drinks only socially, less than once a month. Her cousin's birthday was last night so she was drinking grey goose out of the bottle ("passing the bottle back and forth") and does not know how much she drank. She reports she last took her Keppra yesterday (6/30) at [facility]. She reports a headache, but otherwise has no complaints.   Plan: - Start Keppra 1500 mg PO BID - Tylenol 650 mg x 1 for headache - Start regular diet - Advised nurse to only give Ativan if actively having a seizure   Albin Felling, MD, MPH Internal Medicine Resident, PGY-III Pager: [phone]    Addendum: Called by nursing again that patient had another seizure. Will change Keppra to IV and give 1500 mg Q12H and change diet back to NPO. Obtain Keppra level.

## 2016-03-15 NOTE — Progress Notes (Signed)
Pt admitted from ED. Report received from [place], [place]. Pt drowsy and oriented to name and date; disoriented to location and situation. Respirations are even and unlabored; sats in upper 90's on room air. Pt tearful and stated "I'm scared". Upon assessment patient was asked if she knew what happened to her and why she was here. She replied "no". She was updated as to what brought her in and she apologized.; She was reassured and asked what she took and how much she drank. Pt stated she didn't know how much she drank and doesn't remember "popping any pills" or taking any street drugs. No family present on admission. Pt requested [facility] (significant other) be called and notified; (I spoke with her.)Tele in place; NSR. VSS. PIV infusing the sixth round of potassium. Pt oriented to room and call bell education given. Pt verbalized understanding but reinforcement is needed. Bed is low and brakes are locked. Call bell and personal items within reach. Bed alarm activated. Will continue to monitor.

## 2016-03-15 NOTE — Progress Notes (Signed)
Patient actively seizing. Nurse and phlebotomist at bedside. VSS; Dr. Arcelia Jew paged. Currently resting quietly with eyes closed. Bed alarm on. Seizure precautions continued. Will continue to monitor.

## 2016-03-15 NOTE — Progress Notes (Signed)
Patient had another seizure. Lasted 1 min. Posturing; turned to right side. Respirations unlabored and symmetrical. VSS. Pt responsive to name.

## 2016-03-15 NOTE — Progress Notes (Signed)
Patient seizing. Lasted approx 1 min. Rigid/ shaking movements/ posturing. Turning to right side. Abnormal breathing with O2 sats maintained on 2L [place]. 1 mg ativan given.  Responsive to name at the end of seizure. Seizure precautions still in place with suction set up. Bed alarm activated. Will continue to monitor and intervene PRN.

## 2016-03-15 NOTE — H&P (Signed)
Date: 03/15/2016               Patient Name:  Tina [name] MRN: [id number]  DOB: 1981-03-17 Age / Sex: 35 y.o., female   PCP: Arnoldo Morale, MD         Medical Service: Internal Medicine Teaching Service         Attending Physician: Dr. Oval Linsey, MD    First Contact: Dr. Lorella Nimrod Pager: [phone]  Second Contact: Dr. Albin Felling Pager: [phone]       After Hours (After 5p/  First Contact Pager: [phone]  weekends / holidays): Second Contact Pager: [phone]   Chief Complaint: Unresponsive.  History of Present Illness: 35 yo female with H/O seizures and non compliant to her meds.brought to ED by a cousin.We tried to obtain some history from her cousin who said she had a seizure at home around 3.00am and was not taking her seizure meds .Apart from that he was unable to provide any further details. She  had one episode of seizure in ED which was resolved with Ativan and loading dose of Keppra. She is post ictal and very drowsy so unable to obtain any history.At ED her EtOH level was high at 239 and on UDS was found to be positive for opiates and Tetrahydrocannabinol which can be contributry for her current condition. Will try to obtain further details once she is awake and alert. Meds: Current Facility-Administered Medications  Medication Dose Route Frequency Provider Last Rate Last Dose  . potassium chloride 10 mEq in 100 mL IVPB  10 mEq Intravenous Q1 Hr x 6 Carly J Rivet, MD 100 mL/hr at 03/15/16 0919 10 mEq at 03/15/16 [id number]   Current Outpatient Prescriptions  Medication Sig Dispense Refill  . albuterol (PROVENTIL HFA;VENTOLIN HFA) 108 (90 Base) MCG/ACT inhaler Inhale 2 puffs into the lungs every 4 (four) hours as needed for wheezing.    . ARIPiprazole (ABILIFY) 10 MG tablet Take 10 mg by mouth daily.    Marland Kitchen bismuth subsalicylate (PEPTO BISMOL) 262 MG/15ML suspension Take 30 mLs by mouth every 6 (six) hours as needed for indigestion.    . cetirizine (ZYRTEC) 10 MG  chewable tablet Chew 10 mg by mouth daily as needed for allergies.    . fluticasone furoate-vilanterol (BREO ELLIPTA) 200-25 MCG/INH AEPB Inhale 1 puff into the lungs daily.    Marland Kitchen levETIRAcetam (KEPPRA XR) 500 MG 24 hr tablet Take 1,500 mg by mouth daily.    . OXcarbazepine (TRILEPTAL) 150 MG tablet Take 150 mg by mouth 2 (two) times daily.    . pantoprazole (PROTONIX) 20 MG tablet Take 20 mg by mouth daily.    . sertraline (ZOLOFT) 50 MG tablet Take 50 mg by mouth at bedtime.      Allergies: According to records. Unable to obtained any detail due to her somnolence. Allergies as of 03/15/2016 - Review Complete 03/15/2016  Allergen Reaction Noted  . Latex  03/15/2016  . Penicillins  03/15/2016  . Tramadol  03/15/2016   Past Medical History  Diagnosis Date  . Seizures ([place])     Family History: Unable to obtain as pt. Was intoxicated and very somnulent.  Social History: Unknown due to her altered mental status.  Review of Systems: Unable to obtain any. Will ask her once she becomes more alert. Physical Exam: Blood pressure 95/62, pulse 92, temperature 97.3 F (36.3 C), temperature source Rectal, resp. rate 16, SpO2 100 %. General: Lying on bed,sleeping,  unable to open  eyes on command but just moves when try to awake her up. Looks comfortable, not dyspnoic or seems in pain. Have tattoo marks over her upper body , no sign of trauma. Eyes: light reflex symmetrical bilaterally. No icterus or conjunctival congestion EENT; Mucous membrane moist,  Neck: Supple, No meningeal signs. CVS: S1 / S2, No murmer. Chest: Clear on anterior auscultation. ABD: Soft , non tender, no organomegaly. BS +ve CNS: Pt. Was very somnolence, unable to arouse. Only response by small body movements. Extremities: no oedema, pedal pulses symmetrical bilaterally.  EKG: Shows sinus tachycardia and prolonged QT interval of 531  LABS; Positive for  K: 2.7 WBC: 10.8 Hb: 11.1 UDS: Positive for opiates and  Tetrahydrocannabinol EToH: 239  Assessment & Plan by Problem: 1;Seizure: Pt. With H/O seizure and non compliant with her meds. Had two seizures this morning, one at home and then one in ED. Neurology was consulted and she was given Ativan and a loading dose of Keppra IV. She is post Ictal and should start her on oral Keppra 1561m BID once awake. No need for EEG due to her H/O epilepsy. Infection can be a possible cause of her seizure but unlikely as she don't have fever and just mild leukocytosis which might be due to her seizure activity. She needs counceling on importance of being compliant with her meds.  2: EtOH Intoxication: Can lower her seizure threshold. We need to obtain history about her drinking and if she is a heavy drinker consider CIWA. Need counselling to cut back on her alcohol intake.  3: Drug Abuse: Can be responsible her  current condition. Can consider Naloxone due to her positive opiate positive if she needs. No need currently as her breathing is normal and she is just very sleepy.  4: Hypokalemia; Replace her potassium with KCl IV 10 mEq Q1 hr x 6. Give her some IV 1/2NS with 40 Kcl. 178ml/hr.for 12 hrs.  5; prolonged Q-T interval: Avoids meds. Which can prolong QT.  6:Mild Leukocytosis: Her WBC are higher then normal, most probably due to her seizure, No other sign of infection. Repeat CBC in the morning to see the trend.      Diet: NPo currently. Can resume normal diet once alert. DVT PPx: Lovenox SQ Dispo: Admit patient to Step down for close monitoring . Expected length of stay approx. 2 days.  Signed: Lorella Nimrod, MD 03/15/2016, 9:45 AM  Pager: [phone]

## 2016-03-16 ENCOUNTER — Observation Stay (HOSPITAL_COMMUNITY): Payer: Self-pay

## 2016-03-16 DIAGNOSIS — F1010 Alcohol abuse, uncomplicated: Secondary | ICD-10-CM | POA: Insufficient documentation

## 2016-03-16 DIAGNOSIS — E162 Hypoglycemia, unspecified: Secondary | ICD-10-CM

## 2016-03-16 LAB — CBC
HEMATOCRIT: 32.6 % — AB (ref 36.0–46.0)
Hemoglobin: 10.7 g/dL — ABNORMAL LOW (ref 12.0–15.0)
MCH: 31.1 pg (ref 26.0–34.0)
MCHC: 32.8 g/dL (ref 30.0–36.0)
MCV: 94.8 fL (ref 78.0–100.0)
PLATELETS: 234 10*3/uL (ref 150–400)
RBC: 3.44 MIL/uL — ABNORMAL LOW (ref 3.87–5.11)
RDW: 12.2 % (ref 11.5–15.5)
WBC: 8.1 10*3/uL (ref 4.0–10.5)

## 2016-03-16 LAB — BASIC METABOLIC PANEL
Anion gap: 7 (ref 5–15)
BUN: 5 mg/dL — AB (ref 6–20)
CO2: 20 mmol/L — ABNORMAL LOW (ref 22–32)
CREATININE: 0.67 mg/dL (ref 0.44–1.00)
Calcium: 8.3 mg/dL — ABNORMAL LOW (ref 8.9–10.3)
Chloride: 107 mmol/L (ref 101–111)
GFR calc Af Amer: >60 mL/min (ref >60)
GLUCOSE: 64 mg/dL — AB (ref 65–99)
POTASSIUM: 4 mmol/L (ref 3.5–5.1)
SODIUM: 134 mmol/L — AB (ref 135–145)

## 2016-03-16 LAB — GLUCOSE, CAPILLARY
GLUCOSE-CAPILLARY: 72 mg/dL (ref 65–99)
Glucose-Capillary: 73 mg/dL (ref 65–99)

## 2016-03-16 MED ORDER — KETOROLAC TROMETHAMINE 30 MG/ML IJ SOLN
30.0000 mg | Freq: Once | INTRAMUSCULAR | Status: AC
  Administered 2016-03-16: 30 mg via INTRAVENOUS
  Filled 2016-03-16: qty 1

## 2016-03-16 MED ORDER — CETYLPYRIDINIUM CHLORIDE 0.05 % MT LIQD
7.0000 mL | Freq: Two times a day (BID) | OROMUCOSAL | Status: DC
  Administered 2016-03-16 – 2016-03-18 (×4): 7 mL via OROMUCOSAL

## 2016-03-16 MED ORDER — DEXTROSE-NACL 5-0.45 % IV SOLN
INTRAVENOUS | Status: DC
  Administered 2016-03-16 – 2016-03-18 (×5): via INTRAVENOUS

## 2016-03-16 NOTE — Progress Notes (Signed)
Patient is transferred from 2 central, patient is alert. Patient made comfortable. Seizure pads placed in bed and tele placed on patient and was verified. Will continue to monitor.

## 2016-03-16 NOTE — Progress Notes (Signed)
Subjective:  2 events last night and 1 this morning.  Descriptions sound like pseudoseizures.  However, Vimpat was added and ativan given several times.  She is very drowsy as a result.  She claims full compliance with keppra but says dose was 1000 mg bid and not 1500 mg bid as was told in the ER.   Objective: Current vital signs: BP 101/77 mmHg  Pulse 92  Temp(Src) 98.4 F (36.9 C) (Oral)  Resp 12  Ht 5\' 4"  (1.626 m)  Wt 57.335 kg (126 lb 6.4 oz)  BMI 21.69 kg/m2  SpO2 100% Vital signs in last 24 hours: Temp:  [98.1 F (36.7 C)-98.4 F (36.9 C)] 98.4 F (36.9 C) (07/02 0800) Pulse Rate:  [76-99] 92 (07/02 0800) Resp:  [12-19] 12 (07/02 0800) BP: (94-118)/(61-89) 101/77 mmHg (07/02 0800) SpO2:  [100 %] 100 % (07/02 0800) Weight:  [55.52 kg (122 lb 6.4 oz)-57.335 kg (126 lb 6.4 oz)] 57.335 kg (126 lb 6.4 oz) (07/02 0601)  Intake/Output from previous day: 07/01 0701 - 07/02 0700 In: 1431.3 [I.V.:1156.3; IV Piggyback:275] Out: 1300 [Urine:1300] Intake/Output this shift:   Nutritional status: Diet NPO time specified  Neurologic Exam: Lethargic, but oriented. Follows commands. Moves all extremities.  Coordination intact.  Lab Results: Basic Metabolic Panel:  Recent Labs Lab 03/15/16 0624 03/15/16 1641 03/16/16 0247  NA 141 136 134*  K 2.7* 3.8 4.0  CL 108 110 107  CO2 22 20* 20*  GLUCOSE 128* 77 64*  BUN 8 5* 5*  CREATININE 0.79 0.75 0.67  CALCIUM 8.5* 8.1* 8.3*  MG 1.9  --   --     Liver Function Tests:  Recent Labs Lab 03/15/16 0624  AST 17  ALT 14  ALKPHOS 38  BILITOT 0.4  PROT 6.5  ALBUMIN 3.7   No results for input(s): LIPASE, AMYLASE in the last 168 hours. No results for input(s): AMMONIA in the last 168 hours.  CBC:  Recent Labs Lab 03/15/16 0624 03/16/16 0247  WBC 10.8* 8.1  NEUTROABS 6.4  --   HGB 11.1* 10.7*  HCT 33.5* 32.6*  MCV 93.3 94.8  PLT 230 234    Cardiac Enzymes: No results for input(s): CKTOTAL, CKMB, CKMBINDEX,  TROPONINI in the last 168 hours.  Lipid Panel: No results for input(s): CHOL, TRIG, HDL, CHOLHDL, VLDL, LDLCALC in the last 168 hours.  CBG:  Recent Labs Lab 03/15/16 0615 03/16/16 0450  GLUCAP 133* 72    Microbiology: Results for orders placed or performed during the hospital encounter of 03/15/16  MRSA PCR Screening     Status: None   Collection Time: 03/15/16  3:11 PM  Result Value Ref Range Status   MRSA by PCR NEGATIVE NEGATIVE Final    Comment:        The GeneXpert MRSA Assay (FDA approved for NASAL specimens only), is one component of a comprehensive MRSA colonization surveillance program. It is not intended to diagnose MRSA infection nor to guide or monitor treatment for MRSA infections.     Coagulation Studies: No results for input(s): LABPROT, INR in the last 72 hours.  Imaging: No results found.  Medications:  Scheduled: . acetaminophen  650 mg Oral Once  . antiseptic oral rinse  7 mL Mouth Rinse BID  . enoxaparin (LOVENOX) injection  40 mg Subcutaneous Q24H  . lacosamide (VIMPAT) IV  100 mg Intravenous Q12H  . levETIRAcetam  1,500 mg Intravenous Q12H  . LORazepam  1 mg Intravenous Once  . 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  I will start with a routine EEG.  If events become more frequent, we can consider continuous long term EEG.  For now, I will not change AED regimen, but avoid ativan administration unless event does not stop by itself after 10 minutes.  I want her more awake and alert and less sedated.        Rogue Jury, MD [facility]  03/16/2016, 8:23 AM

## 2016-03-16 NOTE — Progress Notes (Signed)
   Subjective: She was drowsy, but arousable. C/O headaches.She said she was compliant with her medicines? Had 2 more seizures last night. Nurses was concern that they might be pseudo seizures. Objective: Vital signs in last 24 hours: Filed Vitals:   03/15/16 2000 03/16/16 0019 03/16/16 0601 03/16/16 0800  BP: 107/78 107/76 105/75 101/77  Pulse: 79 83 84 92  Temp:  98.3 F (36.8 C) 98.4 F (36.9 C) 98.4 F (36.9 C)  TempSrc:   Axillary Oral  Resp: 16 13 16 12   Height:      Weight:   126 lb 6.4 oz (57.335 kg)   SpO2: 100% 100% 100% 100%   Physical Exam General: Lying on bed,sleeping, was able to wake her up but she was drowsy.. Looks comfortable, not dyspnoic or seems in pain. Have tattoo marks over her upper body , no sign of trauma. EENT; Mucous membrane moist,  Neck: Supple, No meningeal signs. CVS: S1 / S2, No murmer. Chest: Clear on anterior auscultation. ABD: Soft , non tender, no organomegaly. BS +ve CNS: Pt. Was very somnolence, able to arouse but drowsy.Able to follow commands and somewhat communicate. Extremities: no oedema, pedal pulses symmetrical bilaterally.  LABS: Reviewed Na 134 K; 4.0 Glu: 64  EKG: Normal sinus rhythm  Normal QT interval  MEDS: reviewed  Assessment/Plan:  1;Seizure: Pt. With H/O seizure . Had two seizures yesterday morning, one at home and then one in ED. Neurology was consulted and she was given Ativan and a loading dose of Keppra IV. She developed more seizures later in the day and was given ativan 3x and started on IV Kappra 1500 mg Q12H. She had 2 more during night and started on Vimpat 100mg  Q12h according to Neurology advice. There is a concern between seizure and Pseudo seizure. She will have an EEG done today to confirm seizure activity and might be consider for continuous if that was in conclusive. We planned her to move to a med serge room with camera to keep an eye on her seizure activity.   2: EtOH Intoxication: Can lower her  seizure threshold. She said she is just a social drinker and last night drink a lot during a birthday party.Need counselling  on her alcohol intake as that may decrease her seizure threshold.  3: Drug Abuse: Can be responsible her current condition. She needs counseling regarding that esp. while on seizure meds.  4: Hypokalemia; Resolved after replacement.  5; prolonged Q-T interval: Resolved on today's EKG, most probably due to her hypokalemia yesterday.  6:Mild Leukocytosis: Her WBC are higher then normal, most probably due to her seizure,  There is a downward trend on today's CBC. No other sign of infection. Repeat CBC in the morning to see the trend.  7: Mild Hypoglycemia:Her blood glucose was 64 this morning. Start her on dextrose 5 , 137ml/hr. Till she remains NPO. Can resume her normal diet once more alert.  Diet: NPo currently. Can resume normal diet once alert.  DVT PPx: Lovenox SQ   Dispo: Move from step down to med serge room with a camera. Anticipated discharge in approximately 2 day(s).   LOS: 1 day   Lorella Nimrod, MD 03/16/2016, 10:57 AM Pager: [phone]

## 2016-03-16 NOTE — Progress Notes (Signed)
Patient had seizure that lasted for 20 seconds. Patient was leaning towards her left side and was crying at the same time. No loss of consciousness. Will continue to monitor.

## 2016-03-16 NOTE — Progress Notes (Signed)
MD paged to notify that patient has transferred to the unit, awaiting call back.

## 2016-03-16 NOTE — Progress Notes (Signed)
Called report to [facility] spoke to [place] RN Pt will transfer per bed with padded rails to [place] bed 12. Pt has cell phone with her in the bed.

## 2016-03-16 NOTE — Progress Notes (Signed)
Pt experienced another seizure within 15 minutes of the previous. Same course as before. ~30 seconds. 1mg  IV ativan given.  Will monitor and pass along to day shift.

## 2016-03-16 NOTE — Progress Notes (Signed)
Pt transferred per bed to 5 C bed 12. Has her cell phone with her. Family aware of transfer

## 2016-03-16 NOTE — Progress Notes (Signed)
Pt experienced a seizure at 0645. Tonic/clonic movement with turn to the right side. Vital signs stable. Pt is alert and responsive following the event.

## 2016-03-16 NOTE — Progress Notes (Signed)
MD called back and writer notified her about patient's appointment for disability tomorrow. MD said will notify her care team.

## 2016-03-17 ENCOUNTER — Inpatient Hospital Stay (HOSPITAL_COMMUNITY): Payer: Self-pay

## 2016-03-17 DIAGNOSIS — R569 Unspecified convulsions: Secondary | ICD-10-CM

## 2016-03-17 LAB — BASIC METABOLIC PANEL
Anion gap: 4 — ABNORMAL LOW (ref 5–15)
CHLORIDE: 109 mmol/L (ref 101–111)
CO2: 23 mmol/L (ref 22–32)
Calcium: 8.3 mg/dL — ABNORMAL LOW (ref 8.9–10.3)
Creatinine, Ser: 0.68 mg/dL (ref 0.44–1.00)
GFR calc Af Amer: >60 mL/min (ref >60)
GFR calc non Af Amer: >60 mL/min (ref >60)
GLUCOSE: 122 mg/dL — AB (ref 65–99)
POTASSIUM: 3.2 mmol/L — AB (ref 3.5–5.1)
Sodium: 136 mmol/L (ref 135–145)

## 2016-03-17 LAB — MAGNESIUM: Magnesium: 1.7 mg/dL (ref 1.7–2.4)

## 2016-03-17 MED ORDER — MAGNESIUM SULFATE 2 GM/50ML IV SOLN
2.0000 g | Freq: Once | INTRAVENOUS | Status: AC
  Administered 2016-03-17: 2 g via INTRAVENOUS
  Filled 2016-03-17: qty 50

## 2016-03-17 MED ORDER — POTASSIUM CHLORIDE CRYS ER 20 MEQ PO TBCR
40.0000 meq | EXTENDED_RELEASE_TABLET | Freq: Once | ORAL | Status: AC
  Administered 2016-03-17: 40 meq via ORAL
  Filled 2016-03-17: qty 2

## 2016-03-17 MED ORDER — ACETAMINOPHEN 325 MG PO TABS
650.0000 mg | ORAL_TABLET | ORAL | Status: DC | PRN
  Administered 2016-03-17 – 2016-03-18 (×5): 650 mg via ORAL
  Filled 2016-03-17 (×5): qty 2

## 2016-03-17 NOTE — Care Management Note (Signed)
Case Management Note  Patient Details  Name: Tina [name] MRN: [id number] Date of Birth: [birth year]  Subjective/Objective:   Pt admitted with seizures. She is from home. Pt positive for ETOH, THC and opiates.       Pt without insurance or PCP.            Action/Plan: CM following for discharge needs.   Expected Discharge Date:                  Expected Discharge Plan:  Home/Self Care  In-House Referral:     Discharge planning Services     Post Acute Care Choice:    Choice offered to:     DME Arranged:    DME Agency:     HH Arranged:    HH Agency:     Status of Service:  In process, will continue to follow  If discussed at Long Length of Stay Meetings, dates discussed:    Additional Comments:  Pollie Friar, RN 03/17/2016, 10:11 AM

## 2016-03-17 NOTE — Care Management Note (Signed)
Case Management Note  Patient Details  Name: Aliona Sluiter MRN: [id number] Date of Birth: December 11, 1980  Subjective/Objective:                    Action/Plan: Attempted to see pt reguarding no insurance and follow up post discharge but she was lethargic and not currently interacting. CM will follow up with her tomorrow.   Expected Discharge Date:                  Expected Discharge Plan:  Home/Self Care  In-House Referral:     Discharge planning Services     Post Acute Care Choice:    Choice offered to:     DME Arranged:    DME Agency:     HH Arranged:    HH Agency:     Status of Service:  In process, will continue to follow  If discussed at Long Length of Stay Meetings, dates discussed:    Additional Comments:  Pollie Friar, RN 03/17/2016, 3:18 PM

## 2016-03-17 NOTE — Progress Notes (Signed)
vLTM EEG running/ no skin breakdown/ tested button

## 2016-03-17 NOTE — Progress Notes (Signed)
   Subjective: Pt. C/O headache. Still little drowsy but more alert then before.According to her she was never non compliant and taking keppra 1000mg  BID. Admits bing drinking that night and use of illicit drugs, was counseled about that but looks like don't have much insight about it.She said she had a disability appointment today.    Objective: Vital signs in last 24 hours: Filed Vitals:   03/16/16 1200 03/16/16 1345 03/16/16 2133 03/17/16 0518  BP: 113/73 111/69 104/68 112/73  Pulse: 92 80 79 68  Temp: 98.5 F (36.9 C) 98.7 F (37.1 C) 98.8 F (37.1 C) 98.8 F (37.1 C)  TempSrc: Oral Oral Oral Oral  Resp: 22 16 18 18   Height:      Weight:      SpO2: 100% 100% 99% 100%   Physical Exam General: Lying on bed,sleeping, was able to wake her up . Looks comfortable, not dyspnoic or seems in pain. Have tattoo marks over her upper body , no sign of trauma. EENT; Mucous membrane moist,  Neck: Supple, No meningeal signs. CVS: S1 / S2, No murmer. Chest: Clear on anterior auscultation. ABD: Soft , non tender, no organomegaly. BS +ve CNS: Pt. Was very somnolence, able to arouse but drowsy.Able to follow commands and somewhat communicate. Extremities: no oedema, pedal pulses symmetrical bilaterally. LABS: Only positive for K; 3.2  MEDS: Reviewed Assessment/Plan:  1;Seizure: Pt. With H/O seizure . Had two seizures yesterday morning, one at home and then one in ED. Neurology was consulted and she was given Ativan and a loading dose of Keppra IV. She developed more seizures later in the day and was given ativan 3x and started on IV Kappra 1500 mg Q12H. She had 2 more during night and started on Vimpat 100mg  Q12h according to Neurology advice. There is a concern between seizure and Pseudo seizure. She will have an EEG done today to confirm seizure activity and might be consider for continuous if that was in conclusive.  Continue Keppra 1500 mg BID Vimpat 100mg    2: EtOH Intoxication: Can  lower her seizure threshold. She said she is just a social drinker and last night drink a lot during a birthday party.Need counselling on her alcohol intake as that may decrease her seizure threshold.  3: Drug Abuse: Can be responsible her current condition. She needs counseling regarding that esp. while on seizure meds.  4: Hypokalemia; Her lab. Shows mild hypokalemia at 3.2 , we replaced it.check BMP tomorrow  5; prolonged Q-T interval: Resolved on today's EKG, most probably due to her hypokalemia yesterday.  6:Mild Leukocytosis: Her WBC are higher then normal, most probably due to her seizure, There is a downward trend on today's CBC. No other sign of infection. Repeat CBC in the morning to see the trend.  7: Mild Hypoglycemia: Resolved today with Glu.122 Diet: Normal DVT PPx: Lovenox SQ    Dispo: Anticipated discharge in approximately 1-2 day(s).   LOS: 2 days   Lorella Nimrod, MD 03/17/2016, 8:56 AM Pager: [phone]

## 2016-03-17 NOTE — Progress Notes (Signed)
   03/17/16 1600  Clinical Encounter Type  Visited With Patient  Visit Type Initial  Referral From Nurse  Spiritual Encounters  Spiritual Needs Emotional  Chaplain on afternoon rounds at the request of the nurse met with patient.  Patient was tired.  Chaplain made further care available if needed.

## 2016-03-17 NOTE — Progress Notes (Signed)
Subjective: Lethargic but follows commands. Laying comfortably with two teddy bears at her side.   Exam: Filed Vitals:   03/16/16 2133 03/17/16 0518  BP: 104/68 112/73  Pulse: 79 68  Temp: 98.8 F (37.1 C) 98.8 F (37.1 C)  Resp: 18 18     Gen: In bed, NAD MS: alert and oriented CN: 2-12 intact Motor: moving all extremities well Sensory: intact throughout DTR: 2+  Pertinent Labs/Diagnostics: EEG pending   Impression: History of epilepsy and presents with recurrent seizures. currently drowsy with no clinical seizures. 2 events yesterday Descriptions sound like pseudoseizures.  Now on Keppra 1500 mg BID and Vimpat 100 mg BID   Recommendations: 1) EEG 2) Continue current dose of AED.   Etta Quill PA-C [facility] [phone]  03/17/2016, 9:42 AM

## 2016-03-18 LAB — BASIC METABOLIC PANEL
ANION GAP: 4 — AB (ref 5–15)
BUN: <5 mg/dL — ABNORMAL LOW (ref 6–20)
CALCIUM: 8.3 mg/dL — AB (ref 8.9–10.3)
CO2: 23 mmol/L (ref 22–32)
Chloride: 108 mmol/L (ref 101–111)
Creatinine, Ser: 0.7 mg/dL (ref 0.44–1.00)
Glucose, Bld: 116 mg/dL — ABNORMAL HIGH (ref 65–99)
POTASSIUM: 3.3 mmol/L — AB (ref 3.5–5.1)
SODIUM: 135 mmol/L (ref 135–145)

## 2016-03-18 MED ORDER — DIPHENHYDRAMINE HCL 25 MG PO CAPS
25.0000 mg | ORAL_CAPSULE | Freq: Four times a day (QID) | ORAL | Status: DC | PRN
  Administered 2016-03-18: 25 mg via ORAL
  Filled 2016-03-18: qty 1

## 2016-03-18 MED ORDER — POTASSIUM CHLORIDE CRYS ER 20 MEQ PO TBCR
40.0000 meq | EXTENDED_RELEASE_TABLET | Freq: Three times a day (TID) | ORAL | Status: DC
  Administered 2016-03-18: 40 meq via ORAL
  Filled 2016-03-18: qty 2

## 2016-03-18 MED ORDER — LEVETIRACETAM 750 MG PO TABS: 1500.0000 mg | ORAL_TABLET | Freq: Two times a day (BID) | ORAL | Status: DC

## 2016-03-18 MED ORDER — MAGNESIUM SULFATE 2 GM/50ML IV SOLN
2.0000 g | Freq: Once | INTRAVENOUS | Status: AC
  Administered 2016-03-18: 2 g via INTRAVENOUS
  Filled 2016-03-18: qty 50

## 2016-03-18 MED ORDER — LACOSAMIDE 100 MG PO TABS: 100.0000 mg | ORAL_TABLET | Freq: Two times a day (BID) | ORAL | Status: DC

## 2016-03-18 MED ORDER — POTASSIUM CHLORIDE CRYS ER 20 MEQ PO TBCR: 40.0000 meq | EXTENDED_RELEASE_TABLET | Freq: Two times a day (BID) | ORAL | Status: DC

## 2016-03-18 NOTE — Progress Notes (Signed)
EEG removed. rn checked with neuro provider. At this time pt not allowed to take a shower. Can wash up at bedside.

## 2016-03-18 NOTE — Care Management Note (Signed)
Case Management Note  Patient Details  Name: Tina [name] MRN: [id number] Date of Birth: Nov 04, 1980  Subjective/Objective:                    Action/Plan: Pt without insurance listed. CM met with the patient and she states she is active with the [facility]. CM asked her about using the pharmacy and she is familiar with this also. CM will continue to follow for d/c needs.   Expected Discharge Date:                  Expected Discharge Plan:  Home/Self Care  In-House Referral:     Discharge planning Services     Post Acute Care Choice:    Choice offered to:     DME Arranged:    DME Agency:     HH Arranged:    HH Agency:     Status of Service:  In process, will continue to follow  If discussed at Long Length of Stay Meetings, dates discussed:    Additional Comments:  Pollie Friar, RN 03/18/2016, 10:58 AM

## 2016-03-18 NOTE — Care Management Note (Signed)
Case Management Note  Patient Details  Name: Tina [name] MRN: [id number] Date of Birth: December 01, 1980  Subjective/Objective:                    Action/Plan: Pt discharging home with Vimpat which is a new medication for the patient. She normally uses the [facility] pharmacy but they are closed for the holiday. CM provided the patient with a 14 day free card for Vimpat and the $10 copay card. Pt instructed she could get medication filled at any pharmacy today but to f/u with [facility] pharmacy prior to the end of the free 2 weeks. Pt in agreement. Pt states she has all her other medications except for her Breo. She says she still has the original prescription since [facility] pharmacy gave her a sample. CM provided her the 30 day free card for her Breo. Bedside RN updated.   Expected Discharge Date:                  Expected Discharge Plan:  Home/Self Care  In-House Referral:     Discharge planning Services  CM Consult  Post Acute Care Choice:    Choice offered to:     DME Arranged:    DME Agency:     HH Arranged:    [place] Agency:     Status of Service:  Completed, signed off  If discussed at [employer] of Stay Meetings, dates discussed:    Additional Comments:  Pollie Friar, RN 03/18/2016, 1:46 PM

## 2016-03-18 NOTE — Progress Notes (Signed)
   Subjective: Pt. Was sleeping. Able to wake her up. Had her continuous EEG going.No seizure activity noted over night. Objective: Vital signs in last 24 hours: Filed Vitals:   03/17/16 2100 03/18/16 0118 03/18/16 0524 03/18/16 0935  BP: 110/79 100/61 105/67 100/55  Pulse: 75 75 65 89  Temp: 98 F (36.7 C) 98.6 F (37 C) 98.9 F (37.2 C) 99.1 F (37.3 C)  TempSrc: Oral Oral Oral Oral  Resp: 18 18 18 20   Height:      Weight:      SpO2: 100% 98% 100% 98%   Physical Exam General: Lying on bed,sleeping, was able to wake her up . Looks comfortable,  CVS: S1 / S2, No murmer. Chest: Clear on anterior auscultation. ABD: Soft , non tender, no organomegaly. BS +ve CNS: Pt. Was very somnolence, able to arouse but drowsy.Able to follow commands and somewhat communicate. Extremities: no oedema, pedal pulses symmetrical bilaterally.  EEG: This is a normal awake and asleep EEG. No spell was captured. Although normal EEG does not support a diagnosis of epilepsy, it does not rule out the diagnosis  LABS: K  3.3  MEDS: Reviewed.   Assessment/Plan:  1:Seizure: Pt. With H/O seizure . No seizure activity noted during last 24 hrs. Overnight EEG showed no epileptiform discharge. We will discharge her on oral Keppra 1500 mg Q12H.and Vimpat 100mg  q12h. She needs to follow up with Neurology to titrate her anti seizure medicine doses.  2: EtOH Intoxication:. We council her on  alcohol intake as that may decrease her seizure threshold.  3: Drug Abuse; We told her about avoiding illicit drugs and how they can effect her condition.   4: Hypokalemia; Her lab.shows mild hypokalemia at 3.3 , we replaced it with oral Kcl for 2 wks. And she needs F/U with her PCP.  Dispo: Pt. Is being discharged today.  Lorella Nimrod, MD 03/18/2016, 12:42 PM Pager: [phone]

## 2016-03-18 NOTE — Progress Notes (Addendum)
Subjective: No seizures over night. Feels tired.   Exam: Filed Vitals:   03/18/16 0118 03/18/16 0524  BP: 100/61 105/67  Pulse: 75 65  Temp: 98.6 F (37 C) 98.9 F (37.2 C)  Resp: 18 18    HEENT-  Normocephalic, no lesions, without obvious abnormality.  Normal external eye and conjunctiva.  Normal TM's bilaterally.  Normal auditory canals and external ears. Normal external nose, mucus membranes and septum.  Normal pharynx. Cardiovascular- S1, S2 normal, pulses palpable throughout   Lungs- chest clear, no wheezing, rales, normal symmetric air entry Abdomen- normal findings: bowel sounds normal Extremities- no edema Lymph-no adenopathy palpable Musculoskeletal-no joint tenderness, deformity or swelling Skin-warm and dry, no hyperpigmentation, vitiligo, or suspicious lesions    Gen: In bed, NAD MS: alert and oriented. Follows commands.  CN: 2-12 intact Motor: MAEW Sensory: intact throughout   Pertinent Labs/Diagnostics: LTM running with no seizure activity.   Impression: seizure versus pseudoseizure--difficult as she does have history of real seizures. Will continue LTM but if no seizure after 24 hours would continue AED regime and no further recommendations.    Recommendations: 1) EEG to be continued to full 24 hours.  2) Continue current dose of AED.  3) will need out patient follow up with neurology OR possible ambulatory EEG later to be set up by out patient neurology  Addendum: EEG showed no spells or epileptiform activity. At this time would continue current AED regime and have follow up with out patient neurology.  S/O   Etta Quill PA-C [facility] [phone]  03/18/2016, 8:53 AM

## 2016-03-18 NOTE — Procedures (Addendum)
Continuous Video-EEG Monitoring Report  Patient: Tina [name], Tina [name]     EEG No.ID: [id number]     DOB: Mar 16, 1981 Age: 35 Room#5C12  MED REC NO: [id number] Gender: Female   TECH: Amy Zwelling     Physician: Winfield Cunas     Referring Physician: Melba Coon     Report Date: 03/18/2016      Study Duration: 03/17/2016 11:32 to 03/18/2016 11:28 CPT Code:  [id number] Diagnosis:  Spells (R56.9)   History: This is a 35 year old female presenting with sharking spells.  Video-EEG monitoring was performed to evaluate for seizures.  Technical Details:  Long-term video-EEG monitoring was performed using standard setting per the guidelines.  Briefly, a minimum of 21 electrodes were placed on scalp according to the International 10-20 or/and 10-10 Systems.  Supplemental electrodes were placed as needed.  Single EKG electrode was also used to detect cardiac arrhythmia.  Patient's behavior was continuously recorded on video simultaneously with EEG.  A minimum of 16 channels were used for data display.  Each epoch of study was reviewed manually daily and as needed using standard referential and bipolar montages.    EEG Description:  During wakefulness, there was a bilateral reactive 12 Hz posterior dominant rhythm.  Diffuse beta activity was present, likely due to medication effect (e.g., benzodiazepine).  During drowsiness, slow roving eye movement, vertex waves and attenuation of background and were seen.  K complexes and spindles were present in stage II sleep.  No focal, lateralized or periodic abnormality was seen.  No epileptiform discharges or seizures were in evidence.  No spell was captured.  Impression:  This is a normal awake and asleep EEG.  No spell was captured.  Although normal EEG does not support a diagnosis of epilepsy, it does not rule out the diagnosis                       [place] Physician: Winfield Cunas, MD, PhD

## 2016-03-18 NOTE — Progress Notes (Addendum)
Pt discharged home by car, assessment stable, discharge instructions reviewed, prescriptions given, all questions answered. IV removed. Tele discontinued. Pt taken by volunteer via wheelchair to exit. Time of discharge: 1413  Plan is for pt to take vimpat rx to a pharmacy that is open today and take pm dose when due. Pt has enough keppra at home to be able to take 1500mg  pm dose tonight. If [facility] not open today, pt will will skip pm potassium dose tonight, and resume BID potassium tomorrow.  rn offered to given pt potassium dose at this time early. Pt refused.

## 2016-03-18 NOTE — Progress Notes (Signed)
LTM EEG D/C'd per Dr. Shon Hale

## 2016-03-18 NOTE — Discharge Instructions (Signed)

## 2016-03-18 NOTE — Discharge Summary (Signed)
Name: Tina [name] MRN: [id number] DOB: [birth year] 35 y.o. PCP: Arnoldo Morale, MD  Date of Admission: 03/15/2016  6:13 AM Date of Discharge: 03/18/2016 Attending Physician: Oval Linsey, MD  Discharge Diagnosis: 1. Seizure 2: Alcohol Abuse 3:Drug Abuse 4; hypokalemia  Discharge Medications:   Medication List    STOP taking these medications        OXcarbazepine 150 MG tablet  Commonly known as:  TRILEPTAL      TAKE these medications        albuterol 108 (90 Base) MCG/ACT inhaler  Commonly known as:  PROVENTIL HFA;VENTOLIN HFA  Inhale 2 puffs into the lungs every 4 (four) hours as needed for wheezing.     ARIPiprazole 10 MG tablet  Commonly known as:  ABILIFY  Take 10 mg by mouth daily.     BREO ELLIPTA 200-25 MCG/INH Aepb  Generic drug:  fluticasone furoate-vilanterol  Inhale 1 puff into the lungs daily.     cetirizine 10 MG chewable tablet  Commonly known as:  ZYRTEC  Chew 10 mg by mouth daily as needed for allergies.     Lacosamide 100 MG Tabs  Take 1 tablet (100 mg total) by mouth 2 (two) times daily.     levETIRAcetam 750 MG tablet  Commonly known as:  KEPPRA  Take 2 tablets (1,500 mg total) by mouth 2 (two) times daily.     potassium chloride SA 20 MEQ tablet  Commonly known as:  K-DUR,KLOR-CON  Take 2 tablets (40 mEq total) by mouth 2 (two) times daily.        Disposition and follow-up:   TinaTina [name] was discharged from [facility] in Good condition.  At the hospital follow up visit please address:  1.  Titrate her seizure medicines.Check to see if she still needs her K supplement. She will need to counsel again about her alcohol and illicit drug abuse.  2.  Labs / imaging needed at time of follow-up: BMP  3.  Pending labs/ test needing follow-up: None  Follow-up Appointments: Neurology follow up will be scheduled.   Hospital Course by problem list: 1.Seizure: Pt. With H/O seizure. Had two  seizures one the day of admission, one at home and then one in ED. Neurology was consulted and she was given Ativan and a loading dose of Keppra IV. Later she had 4-5 more episodes and then started on IV Keppra 1500 mg q12h. And next day added Vimpat 100 mg IV due to 2 more seizure like activity over night. Her CT was normal, ruling out any structural etiology.There was a concern between seizure/ pseudo seizure. She had an overnight EEG which shows no seizure activity. Over the course of her hospital stay she remains drowsy most of her time. She is being discharged on two above mentioned seizure medicine orally. She needs to follow up with Neurologist to titrate her Medicine.  2: EtOH. And Drug abuse: According to patient she is just a social drinker but sometime get involved in bing drinking. On admission her alcohol level was 239 with UDS positive for opiates and THC , She was advised to avoid that as they can lower her seizure threshold.  3: Hypokalemia: Her Potassium on admission was 2.7, which was replaced along with magnesium.Again dropped after becoming normal,most probably due to her hypomagnesemia. We are sending her home with some oral Kcl supplement for two weeks.  4:Prolonged Q-T interval: She had prolonged QT interval on her admitting EKG which was most  probably due to her hypokalemia as it was resolved on subsequent EKG after replacement.   Discharge Vitals:   BP 100/55 mmHg  Pulse 89  Temp(Src) 99.1 F (37.3 C) (Oral)  Resp 20  Ht 5\' 4"  (1.626 m)  Wt 126 lb 6.4 oz (57.335 kg)  BMI 21.69 kg/m2  SpO2 98%  LMP  (LMP Unknown)   Physical Exam General: Lying on bed,sleeping, was able to wake her up . Looks comfortable,  CVS: S1 / S2, No murmer. Chest: Clear on anterior auscultation. ABD: Soft , non tender, no organomegaly. BS +ve CNS: Pt. Was very somnolence, able to arouse but drowsy.Able to follow commands and somewhat communicate. Extremities: no oedema, pedal pulses  symmetrical bilaterally.  Pertinent Labs, Studies, and Procedures:   CBC: Initially mild leukocytosis which was resolved. BMP: K 2.7    4.0   3.3           Mg 1.4  1.7  Alcohol: 239  UDS: Positive for opiates and THC.  EKG: Sinus tachycardia with prolong QT which becomes normal next day after K replacement.  CT Head: Normal  EEG: Normal, no seizure activity.  Discharge Instructions: Discharge Instructions    Ambulatory referral to Neurology    Complete by:  As directed   An appointment is requested in approximately: 2 weeks     Diet - low sodium heart healthy    Complete by:  As directed      Discharge instructions    Complete by:  As directed   It was a pleasure taking care of you, Tina [name]. You were hospitalized for seizures.  Please take the following medications. Vimpat 100 mg twice daily Keppra 1500 mg twice daily Potassium chloride 40 mEq twice daily  You will be contacted by a Neurology office here in [place] to set up a follow up appointment in the next 2 weeks.  Take care, Dr. Arcelia Jew     Increase activity slowly    Complete by:  As directed            Signed: Lorella Nimrod, MD 03/18/2016, 1:48 PM   Pager: [phone]

## 2016-03-19 ENCOUNTER — Encounter (HOSPITAL_COMMUNITY): Payer: Self-pay | Admitting: Vascular Surgery

## 2016-03-19 MED FILL — levETIRAcetam 750 MG TABS: 750 | 15 days supply | Qty: 60 | Fill #0

## 2016-03-19 MED FILL — POTASSIUM CL ER 20 MEQ TAB: 20 | 8 days supply | Qty: 30 | Fill #0

## 2016-03-20 ENCOUNTER — Ambulatory Visit (HOSPITAL_COMMUNITY): Payer: Self-pay | Admitting: Psychiatry

## 2016-03-20 LAB — LEVETIRACETAM LEVEL: LEVETIRACETAM: 14.7 ug/mL (ref 10.0–40.0)

## 2016-03-28 MED FILL — PANTOPRAZOLE SOD DR 20 MG T: 20 | 30 days supply | Qty: 30 | Fill #1

## 2016-03-28 MED FILL — ?CETIRIZINE HCL 10 MG TABLE: 10 | 30 days supply | Qty: 30 | Fill #1

## 2016-04-04 ENCOUNTER — Encounter (HOSPITAL_COMMUNITY): Payer: Self-pay

## 2016-04-04 ENCOUNTER — Emergency Department (HOSPITAL_COMMUNITY)
Admission: EM | Admit: 2016-04-04 | Discharge: 2016-04-04 | Disposition: A | Discharge status: Home / Self Care | Payer: PRIVATE HEALTH INSURANCE | Attending: Emergency Medicine | Admitting: Emergency Medicine

## 2016-04-04 ENCOUNTER — Emergency Department (HOSPITAL_COMMUNITY): Payer: PRIVATE HEALTH INSURANCE

## 2016-04-04 DIAGNOSIS — F329 Major depressive disorder, single episode, unspecified: Secondary | ICD-10-CM | POA: Insufficient documentation

## 2016-04-04 DIAGNOSIS — R569 Unspecified convulsions: Secondary | ICD-10-CM

## 2016-04-04 DIAGNOSIS — Z8673 Personal history of transient ischemic attack (TIA), and cerebral infarction without residual deficits: Secondary | ICD-10-CM | POA: Insufficient documentation

## 2016-04-04 DIAGNOSIS — F17210 Nicotine dependence, cigarettes, uncomplicated: Secondary | ICD-10-CM | POA: Insufficient documentation

## 2016-04-04 DIAGNOSIS — Z79899 Other long term (current) drug therapy: Secondary | ICD-10-CM | POA: Insufficient documentation

## 2016-04-04 DIAGNOSIS — J45909 Unspecified asthma, uncomplicated: Secondary | ICD-10-CM | POA: Insufficient documentation

## 2016-04-04 DIAGNOSIS — G40909 Epilepsy, unspecified, not intractable, without status epilepticus: Secondary | ICD-10-CM | POA: Insufficient documentation

## 2016-04-04 LAB — I-STAT BETA HCG BLOOD, ED (MC, WL, AP ONLY)

## 2016-04-04 LAB — CBC WITH DIFFERENTIAL/PLATELET
BASOS PCT: 0 %
Basophils Absolute: 0 10*3/uL (ref 0.0–0.1)
Eosinophils Absolute: 0.1 10*3/uL (ref 0.0–0.7)
Eosinophils Relative: 2 %
HEMATOCRIT: 35.4 % — AB (ref 36.0–46.0)
HEMOGLOBIN: 11.9 g/dL — AB (ref 12.0–15.0)
Lymphocytes Relative: 29 %
Lymphs Abs: 1.5 10*3/uL (ref 0.7–4.0)
MCH: 31.5 pg (ref 26.0–34.0)
MCHC: 33.6 g/dL (ref 30.0–36.0)
MCV: 93.7 fL (ref 78.0–100.0)
MONOS PCT: 7 %
Monocytes Absolute: 0.4 10*3/uL (ref 0.1–1.0)
NEUTROS ABS: 3.3 10*3/uL (ref 1.7–7.7)
NEUTROS PCT: 62 %
Platelets: 307 10*3/uL (ref 150–400)
RBC: 3.78 MIL/uL — AB (ref 3.87–5.11)
RDW: 12.8 % (ref 11.5–15.5)
WBC: 5.3 10*3/uL (ref 4.0–10.5)

## 2016-04-04 LAB — COMPREHENSIVE METABOLIC PANEL
ALBUMIN: 4.7 g/dL (ref 3.5–5.0)
ALK PHOS: 46 U/L (ref 38–126)
ALT: 15 U/L (ref 14–54)
AST: 27 U/L (ref 15–41)
Anion gap: 7 (ref 5–15)
BUN: 11 mg/dL (ref 6–20)
CHLORIDE: 108 mmol/L (ref 101–111)
CO2: 24 mmol/L (ref 22–32)
CREATININE: 0.84 mg/dL (ref 0.44–1.00)
Calcium: 8.9 mg/dL (ref 8.9–10.3)
GFR calc Af Amer: >60 mL/min (ref >60)
GFR calc non Af Amer: >60 mL/min (ref >60)
Glucose, Bld: 78 mg/dL (ref 65–99)
Potassium: 4.7 mmol/L (ref 3.5–5.1)
SODIUM: 139 mmol/L (ref 135–145)
Total Bilirubin: 1.5 mg/dL — ABNORMAL HIGH (ref 0.3–1.2)
Total Protein: 7.6 g/dL (ref 6.5–8.1)

## 2016-04-04 LAB — ETHANOL: Alcohol, Ethyl (B): <5 mg/dL (ref <5)

## 2016-04-04 LAB — SALICYLATE LEVEL: Salicylate Lvl: <4 mg/dL (ref 2.8–30.0)

## 2016-04-04 LAB — I-STAT CG4 LACTIC ACID, ED: Lactic Acid, Venous: 0.94 mmol/L (ref 0.5–1.9)

## 2016-04-04 LAB — ACETAMINOPHEN LEVEL

## 2016-04-04 LAB — MAGNESIUM: Magnesium: 2.2 mg/dL (ref 1.7–2.4)

## 2016-04-04 LAB — RAPID URINE DRUG SCREEN, HOSP PERFORMED
Amphetamines: NOT DETECTED
BARBITURATES: NOT DETECTED
Benzodiazepines: POSITIVE — AB
COCAINE: NOT DETECTED
Opiates: NOT DETECTED
TETRAHYDROCANNABINOL: POSITIVE — AB

## 2016-04-04 LAB — CBG MONITORING, ED: Glucose-Capillary: 80 mg/dL (ref 65–99)

## 2016-04-04 MED ORDER — LACOSAMIDE 100 MG PO TABS: 150.0000 mg | ORAL_TABLET | Freq: Two times a day (BID) | ORAL | Status: DC

## 2016-04-04 NOTE — ED Notes (Addendum)
RN attempted IV access x 2. Both unsuccessful.  Gennette Pac RN, made aware that ultrasound IV needed.
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
## 2016-04-04 NOTE — ED Notes (Signed)
Patient requesting something to eat and drink. Per Dr Oleta Mouse, patient not allowed to eat or drink at this time due to the frequent amount of seizures she has had in sort period of time and afraid patient will aspirate if has another one.  Made patient aware.

## 2016-04-04 NOTE — ED Notes (Signed)
Made Dr Louis Meckel aware of second seizure.

## 2016-04-04 NOTE — Discharge Instructions (Signed)
Continue taking your anti-epileptics but please increase the dose of your Vimpat to 150 mg twice daily. Return for worsening symptoms, including recurrent seizures, fever, confusion, or any other symptoms concerning to you. Please follow-up up closely with your neurologist to discuss need for medication changes.  Seizure, Adult A seizure is abnormal electrical activity in the brain. Seizures usually last from 30 seconds to 2 minutes. There are various types of seizures. Before a seizure, you may have a warning sensation (aura) that a seizure is about to occur. An aura may include the following symptoms:   Fear or anxiety.  Nausea.  Feeling like the room is spinning (vertigo).  Vision changes, such as seeing flashing lights or spots. Common symptoms during a seizure include:  A change in attention or behavior (altered mental status).  Convulsions with rhythmic jerking movements.  Drooling.  Rapid eye movements.  Grunting.  Loss of bladder and bowel control.  Bitter taste in the mouth.  Tongue biting. After a seizure, you may feel confused and sleepy. You may also have an injury resulting from convulsions during the seizure. HOME CARE INSTRUCTIONS   If you are given medicines, take them exactly as prescribed by your health care provider.  Keep all follow-up appointments as directed by your health care provider.  Do not swim or drive or engage in risky activity during which a seizure could cause further injury to you or others until your health care provider says it is OK.  Get adequate rest.  Teach friends and family what to do if you have a seizure. They should:  Lay you on the ground to prevent a fall.  Put a cushion under your head.  Loosen any tight clothing around your neck.  Turn you on your side. If vomiting occurs, this helps keep your airway clear.  Stay with you until you recover.  Know whether or not you need emergency care. SEEK IMMEDIATE MEDICAL CARE  IF:  The seizure lasts longer than 5 minutes.  The seizure is severe or you do not wake up immediately after the seizure.  You have an altered mental status after the seizure.  You are having more frequent or worsening seizures. Someone should drive you to the emergency department or call local emergency services (911 in [place] MAKE SURE YOU:  Understand these instructions.  Will watch your condition.  Will get help right away if you are not doing well or get worse.   This information is not intended to replace advice given to you by your health care provider. Make sure you discuss any questions you have with your health care provider.   Document Released: 08/29/2000 Document Revised: 09/22/2014 Document Reviewed: 04/13/2013 Elsevier Interactive Patient Education [employer].

## 2016-04-04 NOTE — ED Notes (Signed)
Bed: WA11 Expected date:  Expected time:  Means of arrival:  Comments: [facility] 

## 2016-04-04 NOTE — ED Notes (Signed)
Info PT of urine sample. Pt state she just want to lay down

## 2016-04-04 NOTE — ED Provider Notes (Signed)
Case was accepted in sign out pending reevaluation after by mouth challenge. Patient had 2 other episodes of reported seizure-like activity but afterwards did not have a post ictal period and was able to answer questions appropriately. I was not present for either of these apparent episodes. I discussed the case on the phone with neurology who said that if at this time the patient appeared at her neurologic baseline and was awake and alert a would recommend increasing her Vimpat to 150 mg twice daily and having her follow up outpatient with her neurologist. Reevaluation patient had a benign and normal neurologic examination and was alert and oriented. I discussed neurology's recommendations with the patient who express understanding and agreement. Patient was discharged home in stable condition with strict return precautions and with a prescription for the new dosage of her Vimpat.  Harvel Quale, MD 04/05/16 [phone]

## 2016-04-04 NOTE — ED Notes (Signed)
Made Dr Louis Meckel aware of patient seizure.  States that she will be coming to see patient and will decide if she needs to consult with neurology.

## 2016-04-04 NOTE — ED Notes (Signed)
Patient given sandwich and drink. Patient tolerated PO without any complications.

## 2016-04-04 NOTE — ED Notes (Signed)
Unable to collect labs at this time patient is going to xray 

## 2016-04-04 NOTE — ED Provider Notes (Signed)
CSN: [id number]     Arrival date & time 04/04/16  1248 History   First MD Initiated Contact with Patient 04/04/16 1321     Chief Complaint  Patient presents with  . Seizures     (Consider location/radiation/quality/duration/timing/severity/associated sxs/prior Treatment) HPI 35 year old female who presents with seizures. She has a history of seizure disorder, and was recently hospitalized on 03/15/2016 for concern for status epilepticus. She had EEG workup at that time, and neurologist did express concern for potential pseudoseizures. She was started on Vimpat and continued on Keppra. According to EMS they were pulled to the side of the road by bystanders who had noticed that patient was on the ground with active seizure. She had further seizures with EMS and received Versed. On my evaluation patient is somnolent, minimally obeys commands, and minimally answers questions. Complains of severe headache. Unsure of trauma.   Past Medical History  Diagnosis Date  . Epilepsy ([place])   . Asthma   . Stroke ([place])   . Seizures ([place])     last seizure 07-2015  . Stomach ulcer   . Fibroid tumor   . Anxiety   . Depression   . Migraine   . Seizures [facility] ([place]    Past Surgical History  Procedure Laterality Date  . Umbilical hernia repair  12/2014  . Prostate surgery     Family History  Problem Relation Age of Onset  . Heart failure Mother   . Cancer Father   . Seizures Father   . Drug abuse Sister   . Alcohol abuse Sister   . Alcohol abuse Sister   . Drug abuse Sister    Social History  Substance Use Topics  . Smoking status: Current Some Day Smoker -- 0.25 packs/day for 0 years    Types: Cigars  . Smokeless tobacco: None     Comment: BLACK MILD  . Alcohol Use: 0.6 oz/week    1 Cans of beer per week     Comment: occ   OB History    Gravida Para Term Preterm AB TAB SAB Ectopic Multiple Living   0 0 0 0 0 0 0 0       Review of Systems 10/14 systems reviewed and are negative other  than those stated in the HPI    Allergies  Bee venom; Bee venom; Chocolate; Chocolate; Mosquito (diagnostic); Mosquito (diagnostic); Orange fruit; Oregano; Penicillins; Penicillins; Dilantin; Garlic; Tramadol; Dilantin; Garlic; Orange fruit; Oregano; Pork-derived products; Tramadol; Latex; and Tape  Home Medications   Prior to Admission medications   Medication Sig Start Date End Date Taking? Authorizing Provider  cetirizine (ZYRTEC) 10 MG chewable tablet Chew 10 mg by mouth daily as needed for allergies.   Yes Historical Provider, MD  levETIRAcetam (KEPPRA) 750 MG tablet Take 2 tablets (1,500 mg total) by mouth 2 (two) times daily. 03/18/16  Yes Carly Montey Hora, MD  potassium chloride SA (K-DUR,KLOR-CON) 20 MEQ tablet Take 2 tablets (40 mEq total) by mouth 2 (two) times daily. 03/18/16  Yes Carly Montey Hora, MD  albuterol (PROVENTIL HFA;VENTOLIN HFA) 108 (90 Base) MCG/ACT inhaler Inhale 2 puffs into the lungs every 4 (four) hours as needed for wheezing or shortness of breath. 02/21/16   Arnoldo Morale, MD  albuterol (PROVENTIL HFA;VENTOLIN HFA) 108 (90 Base) MCG/ACT inhaler Inhale 2 puffs into the lungs every 4 (four) hours as needed for wheezing.    Historical Provider, MD  ARIPiprazole (ABILIFY) 10 MG tablet Take 1 tablet (10 mg total) by mouth daily. For  mood control Patient not taking: Reported on 02/07/2016 09/12/15   Encarnacion Slates, NP  ARIPiprazole (ABILIFY) 10 MG tablet Take 10 mg by mouth daily.    Historical Provider, MD  bismuth subsalicylate (PEPTO-BISMOL) 262 MG/15ML suspension Take 30 mLs by mouth every 6 (six) hours as needed. 02/19/16   Arnoldo Morale, MD  cetirizine (ZYRTEC) 10 MG tablet Take 1 tablet (10 mg total) by mouth daily as needed for allergies. 02/19/16   Arnoldo Morale, MD  fluticasone furoate-vilanterol (BREO ELLIPTA) 200-25 MCG/INH AEPB Inhale 1 puff into the lungs daily after breakfast. For Asthma 02/21/16   Arnoldo Morale, MD  fluticasone furoate-vilanterol (BREO ELLIPTA) 200-25 MCG/INH  AEPB Inhale 1 puff into the lungs daily.    Historical Provider, MD  hydrOXYzine (ATARAX/VISTARIL) 25 MG tablet Take 1 tablet (25 mg total) by mouth every 6 (six) hours as needed for anxiety. Patient not taking: Reported on 02/07/2016 09/12/15   Encarnacion Slates, NP  Lacosamide 100 MG TABS Take 1 tablet (100 mg total) by mouth 2 (two) times daily. 03/18/16   Juliet Rude, MD  levETIRAcetam (KEPPRA XR) 500 MG 24 hr tablet Take 3 tablets (1,500 mg total) by mouth daily. Patient not taking: Reported on 04/04/2016 03/12/16   Melvenia Beam, MD  OXcarbazepine (TRILEPTAL) 150 MG tablet Take 1 tablet (150 mg total) by mouth 2 (two) times daily. 03/11/16   Nona Dell, PA-C  pantoprazole (PROTONIX) 20 MG tablet Take 1 tablet (20 mg total) by mouth daily. 02/19/16   Arnoldo Morale, MD  sertraline (ZOLOFT) 50 MG tablet Take 1 tablet (50 mg total) by mouth at bedtime. For depression 02/19/16   Arnoldo Morale, MD   BP 113/69 mmHg  Pulse 80  Temp(Src) 98.3 F (36.8 C) (Oral)  Resp 20  SpO2 100%  LMP  (LMP Unknown) Physical Exam Physical Exam  Nursing note and vitals reviewed. Constitutional: Somnolent but arouses to tactile stimuli, non-toxic, and in no acute distress Head: Normocephalic and atraumatic.  Mouth/Throat: Oropharynx is clear and moist.  Neck: Normal range of motion. Neck supple.  Cardiovascular: Normal rate and regular rhythm.   Pulmonary/Chest: Effort normal and breath sounds normal.  Abdominal: Soft. There is no tenderness. There is no rebound and no guarding.  Musculoskeletal: No deformities, bruising, or abrasions. Neurological: Somnolent, arouses to tactile stimulation, no facial droop, fluent speech, moves all extremities symmetrically Skin: Skin is warm and dry.  Psychiatric: Cooperative  ED Course  Procedures (including critical care time) Labs Review Labs Reviewed  CBC WITH DIFFERENTIAL/PLATELET - Abnormal; Notable for the following:    RBC 3.78 (*)    Hemoglobin 11.9 (*)     HCT 35.4 (*)    All other components within normal limits  COMPREHENSIVE METABOLIC PANEL - Abnormal; Notable for the following:    Total Bilirubin 1.5 (*)    All other components within normal limits  URINE RAPID DRUG SCREEN, HOSP PERFORMED - Abnormal; Notable for the following:    Benzodiazepines POSITIVE (*)    Tetrahydrocannabinol POSITIVE (*)    All other components within normal limits  ACETAMINOPHEN LEVEL - Abnormal; Notable for the following:    Acetaminophen (Tylenol), Serum <10 (*)    All other components within normal limits  MAGNESIUM  ETHANOL  SALICYLATE LEVEL  CBG MONITORING, ED  I-STAT CG4 LACTIC ACID, ED  I-STAT BETA HCG BLOOD, ED ([facility], [facility], [facility] ONLY)    Imaging Review Ct Head Wo Contrast  04/04/2016  CLINICAL DATA:  Seizures, un- clear  history of trauma EXAM: CT HEAD WITHOUT CONTRAST CT CERVICAL SPINE WITHOUT CONTRAST TECHNIQUE: Multidetector CT imaging of the head and cervical spine was performed following the standard protocol without intravenous contrast. Multiplanar CT image reconstructions of the cervical spine were also generated. COMPARISON:  10/31/2015 FINDINGS: CT HEAD FINDINGS No skull fracture is noted. Paranasal sinuses and mastoid air cells are unremarkable. No intracranial hemorrhage, mass effect or midline shift. No acute cortical infarction. No mass lesion is noted on this unenhanced scan. The gray and white-matter differentiation is preserved. CT CERVICAL SPINE FINDINGS Axial images of the cervical spine shows no acute fracture or subluxation. There is no pneumothorax in visualized lung apices. Computer processed images shows no acute fracture or subluxation. Alignment, disc spaces and vertebral body heights are preserved. No prevertebral soft tissue swelling. Cervical airway is patent. IMPRESSION: 1. No acute intracranial abnormality. 2. No cervical spine acute fracture or subluxation. Electronically Signed   By: Lahoma Crocker M.D.   On: 04/04/2016 15:09   Ct  Cervical Spine Wo Contrast  04/04/2016  CLINICAL DATA:  Seizures, un- clear history of trauma EXAM: CT HEAD WITHOUT CONTRAST CT CERVICAL SPINE WITHOUT CONTRAST TECHNIQUE: Multidetector CT imaging of the head and cervical spine was performed following the standard protocol without intravenous contrast. Multiplanar CT image reconstructions of the cervical spine were also generated. COMPARISON:  10/31/2015 FINDINGS: CT HEAD FINDINGS No skull fracture is noted. Paranasal sinuses and mastoid air cells are unremarkable. No intracranial hemorrhage, mass effect or midline shift. No acute cortical infarction. No mass lesion is noted on this unenhanced scan. The gray and white-matter differentiation is preserved. CT CERVICAL SPINE FINDINGS Axial images of the cervical spine shows no acute fracture or subluxation. There is no pneumothorax in visualized lung apices. Computer processed images shows no acute fracture or subluxation. Alignment, disc spaces and vertebral body heights are preserved. No prevertebral soft tissue swelling. Cervical airway is patent. IMPRESSION: 1. No acute intracranial abnormality. 2. No cervical spine acute fracture or subluxation. Electronically Signed   By: Lahoma Crocker M.D.   On: 04/04/2016 15:09   I have personally reviewed and evaluated these images and lab results as part of my medical decision-making.   EKG Interpretation   Date/Time:  Friday April 04 2016 12:59:00 EDT Ventricular Rate:  89 PR Interval:    QRS Duration: 87 QT Interval:  359 QTC Calculation: 437 R Axis:   83 Text Interpretation:  Sinus rhythm Borderline T wave abnormalities  Confirmed by LIU MD, Hinton Dyer [id number]) on 04/04/2016 1:26:21 PM      MDM   Final diagnoses:  Seizure-like activity ([place])    35 year old female with history of epilepsy and ? Pseudoseizures (based on last recent admission) who presents with seizures. Is somnolent on presentation, arouses to tactile stimuli, and w/o focal neuro deficits. Vital  signs within normal limits. No signs of trauma, but was found on side of road. Unclear if there was trauma and CT head and neck performed and negative. Prior to CT patient had reported seizure episode and return somnolent again, but quickly arouses to painful stimuli and seen talking on her phone several minutes later. No tachycardia, hypertension. No leukocytosis or elevated lactic acid. No major electrolyte or metabolic derangements. Has subsequently returned to baseline. Signed out to oncoming physician to observe pending PO challenge.   Forde Dandy, MD 04/04/16 [phone]

## 2016-04-04 NOTE — ED Notes (Signed)
Patient requesting to know how much longer before she is able to go home.  When asked patient if she feels able to go home, she states, " kind of yes and no, I feel weird, like SOB."  Patient appears to be in NAD as she lying in bed talking on cell phone.

## 2016-04-30 MED FILL — PANTOPRAZOLE SOD DR 20 MG T: 20 | 30 days supply | Qty: 30 | Fill #2

## 2016-04-30 MED FILL — $VENTOLIN HFA 18G INHALER: 108 (90 BAS | 20 days supply | Qty: 18 | Fill #1

## 2016-04-30 MED FILL — ?CETIRIZINE HCL 10 MG TABLE: 10 | 30 days supply | Qty: 30 | Fill #2

## 2016-04-30 MED FILL — LEVETIRACETAM ER 500 MG TAB: 500 | 30 days supply | Qty: 90 | Fill #0

## 2016-04-30 MED FILL — levETIRAcetam 750 MG TABS: 750 | 15 days supply | Qty: 60 | Fill #1

## 2016-05-01 MED FILL — $BREO ELLIPTA 200-25 MCG IN: 200-25 | 30 days supply | Qty: 60 | Fill #0

## 2016-05-12 ENCOUNTER — Other Ambulatory Visit: Payer: Self-pay

## 2016-05-12 DIAGNOSIS — G40301 Generalized idiopathic epilepsy and epileptic syndromes, not intractable, with status epilepticus: Secondary | ICD-10-CM

## 2016-05-12 MED ORDER — LEVETIRACETAM 750 MG PO TABS: 1500.0000 mg | ORAL_TABLET | Freq: Two times a day (BID) | ORAL | 3 refills | Status: DC

## 2016-05-12 MED ORDER — LEVETIRACETAM ER 500 MG PO TB24: 1500.0000 mg | ORAL_TABLET | Freq: Every day | ORAL | 3 refills | Status: DC

## 2016-06-10 ENCOUNTER — Telehealth: Payer: Self-pay

## 2016-06-10 NOTE — Telephone Encounter (Signed)
Writer tried to call [facility] several times today- phone would continuously ring then disconnect.  Writer then spoke with her significant other Junie Panning who states that patient's phone is out of minutes however she would let her know to call our clinic. At this time Dr.Amao is denying patient's refill for keppra asking her to go back to her neurologist who prescribed the medication. Pharmacy aware.

## 2016-06-11 ENCOUNTER — Emergency Department (HOSPITAL_BASED_OUTPATIENT_CLINIC_OR_DEPARTMENT_OTHER): Payer: Self-pay

## 2016-06-11 ENCOUNTER — Emergency Department (HOSPITAL_BASED_OUTPATIENT_CLINIC_OR_DEPARTMENT_OTHER)
Admission: EM | Admit: 2016-06-11 | Discharge: 2016-06-11 | Disposition: A | Discharge status: Home / Self Care | Payer: Self-pay | Attending: Emergency Medicine | Admitting: Emergency Medicine

## 2016-06-11 ENCOUNTER — Encounter (HOSPITAL_BASED_OUTPATIENT_CLINIC_OR_DEPARTMENT_OTHER): Payer: Self-pay | Admitting: *Deleted

## 2016-06-11 DIAGNOSIS — J45909 Unspecified asthma, uncomplicated: Secondary | ICD-10-CM | POA: Insufficient documentation

## 2016-06-11 DIAGNOSIS — M25571 Pain in right ankle and joints of right foot: Secondary | ICD-10-CM | POA: Insufficient documentation

## 2016-06-11 DIAGNOSIS — M7989 Other specified soft tissue disorders: Secondary | ICD-10-CM | POA: Insufficient documentation

## 2016-06-11 DIAGNOSIS — M25561 Pain in right knee: Secondary | ICD-10-CM | POA: Insufficient documentation

## 2016-06-11 DIAGNOSIS — F17290 Nicotine dependence, other tobacco product, uncomplicated: Secondary | ICD-10-CM | POA: Insufficient documentation

## 2016-06-11 DIAGNOSIS — R200 Anesthesia of skin: Secondary | ICD-10-CM | POA: Insufficient documentation

## 2016-06-11 DIAGNOSIS — Z79899 Other long term (current) drug therapy: Secondary | ICD-10-CM | POA: Insufficient documentation

## 2016-06-11 MED ORDER — IBUPROFEN 600 MG PO TABS: 600.0000 mg | ORAL_TABLET | Freq: Four times a day (QID) | ORAL | 0 refills | Status: DC | PRN

## 2016-06-11 MED ORDER — IBUPROFEN 400 MG PO TABS
600.0000 mg | ORAL_TABLET | Freq: Once | ORAL | Status: AC
  Administered 2016-06-11: 600 mg via ORAL
  Filled 2016-06-11: qty 1

## 2016-06-11 MED FILL — PANTOPRAZOLE SOD DR 20 MG T: 20 | 30 days supply | Qty: 30 | Fill #3

## 2016-06-11 MED FILL — $BREO ELLIPTA 200-25 MCG IN: 200-25 | 30 days supply | Qty: 60 | Fill #1

## 2016-06-11 NOTE — ED Notes (Signed)
PA at bedside.

## 2016-06-11 NOTE — Discharge Instructions (Signed)
I recommend continuing to rest, elevate and apply ice to her right knee for 15 minutes 3-4 times daily. Take ibuprofen as prescribed as needed for pain relief. I recommend continuing to wear your knee brace until follow-up with orthopedics if your pain has not improved. Follow-up with the orthopedic clinic listed above if your symptoms have not improved. Please return to the Emergency Department if symptoms worsen or new onset of fever, redness, swelling, warmth, numbness, tingling, weakness.

## 2016-06-11 NOTE — ED Provider Notes (Signed)
[facility] Provider Note   CSN: [id number] Arrival date & time: 06/11/16  0849     History   Chief Complaint Chief Complaint  Patient presents with  . Knee Pain    HPI Tina [name] is a 35 y.o. female.  Patient is a 35 year old female with history of seizures and asthma who presents to the ED with complaint of right knee pain, onset 2 days. Patient reports she dislocated her knee initially 10 years ago and states since the injury she will frequently dislocated her knee (at least once a week) and reduce it herself. She notes her knee dislocated 2 days ago while walking and after she tried to put it back in place she began to have pain to her right knee and right thigh. She also reports having tingling sensation in her right foot since yesterday. Patient states she has had mild swelling and has been unable to move her right knee due to pain. Patient states she is unable to bear weight due to severe pain. Denies any other recent injury. She notes she wears a knee brace daily to help prevent her knee from dislocating but states she has been unable to wear her brace since her pain started. Denies fever, redness, bruising, calf swelling. Denies taking any medications at home for pain.      Past Medical History:  Diagnosis Date  . Anxiety   . Asthma   . Depression   . Epilepsy ([place])   . Fibroid tumor   . Migraine   . Seizures ([place])    last seizure 05/2016  . Seizures ([place])   . Stomach ulcer   . Stroke [facility])    reports residual left side weakness    Patient Active Problem List   Diagnosis Date Noted  . Alcohol abuse   . Seizure ([place]) 03/15/2016  . Alcohol use ([place]) 03/15/2016  . Hypokalemia 03/15/2016  . Prolonged Q-T interval on ECG   . Somnolence   . 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 Asthma 02/19/2016  . 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 Non compliance with medical treatment 12/07/2015  . Gastritis 11/08/2015  . Allergic  rhinitis 11/08/2015  . Epilepsy ([place])   . Major depressive disorder, recurrent ([place]) 09/10/2015  . Suicidal ideations   . Suicide attempt [facility])     Past Surgical History:  Procedure Laterality Date  . UMBILICAL HERNIA REPAIR  12/2014    OB History    Gravida Para Term Preterm AB Living   0 0 0 0 0     SAB TAB Ectopic Multiple Live Births   0 0 0           Home Medications    Prior to Admission medications   Medication Sig Start Date End Date Taking? Authorizing Provider  albuterol (PROVENTIL HFA;VENTOLIN HFA) 108 (90 Base) MCG/ACT inhaler Inhale 2 puffs into the lungs every 4 (four) hours as needed for wheezing or shortness of breath. 02/21/16  Yes Arnoldo Morale, MD  cetirizine (ZYRTEC) 10 MG tablet Take 1 tablet (10 mg total) by mouth daily as needed for allergies. Patient taking differently: Take 10 mg by mouth 2 (two) times daily.  02/19/16  Yes Arnoldo Morale, MD  fluticasone furoate-vilanterol (BREO ELLIPTA) 200-25 MCG/INH AEPB Inhale 1 puff into the lungs daily after breakfast. For Asthma 02/21/16  Yes Arnoldo Morale, MD  Lacosamide 100 MG TABS Take 1.5 tablets (150 mg total) by mouth 2 (two) times daily. 04/04/16  Yes Harvel Quale,  MD  levETIRAcetam (KEPPRA XR) 500 MG 24 hr tablet Take 3 tablets (1,500 mg total) by mouth daily. 05/12/16  Yes Arnoldo Morale, MD  levETIRAcetam (KEPPRA) 750 MG tablet Take 2 tablets (1,500 mg total) by mouth 2 (two) times daily. 05/12/16  Yes Arnoldo Morale, MD  pantoprazole (PROTONIX) 20 MG tablet Take 1 tablet (20 mg total) by mouth daily. 02/19/16  Yes Arnoldo Morale, MD  ARIPiprazole (ABILIFY) 10 MG tablet Take 1 tablet (10 mg total) by mouth daily. For mood control Patient not taking: Reported on 02/07/2016 09/12/15   Encarnacion Slates, NP  bismuth subsalicylate (PEPTO-BISMOL) 262 MG/15ML suspension Take 30 mLs by mouth every 6 (six) hours as needed. 02/19/16   Arnoldo Morale, MD  hydrOXYzine (ATARAX/VISTARIL) 25 MG tablet Take 1 tablet (25 mg total) by mouth every 6  (six) hours as needed for anxiety. Patient not taking: Reported on 02/07/2016 09/12/15   Encarnacion Slates, NP  ibuprofen (ADVIL,MOTRIN) 600 MG tablet Take 1 tablet (600 mg total) by mouth every 6 (six) hours as needed. 06/11/16   Nona Dell, PA-C  OXcarbazepine (TRILEPTAL) 150 MG tablet Take 1 tablet (150 mg total) by mouth 2 (two) times daily. 03/11/16   Nona Dell, PA-C  potassium chloride SA (K-DUR,KLOR-CON) 20 MEQ tablet Take 2 tablets (40 mEq total) by mouth 2 (two) times daily. 03/18/16   Carly Montey Hora, MD  sertraline (ZOLOFT) 50 MG tablet Take 1 tablet (50 mg total) by mouth at bedtime. For depression 02/19/16   Arnoldo Morale, MD    Family History Family History  Problem Relation Age of Onset  . Heart failure Mother   . Cancer Father   . Seizures Father   . Drug abuse Sister   . Alcohol abuse Sister   . Alcohol abuse Sister   . Drug abuse Sister     Social History Social History  Substance Use Topics  . Smoking status: Current Some Day Smoker    Packs/day: 0.25    Years: 0.00    Types: Cigars  . Smokeless tobacco: Never Used     Comment: BLACK MILD  . Alcohol use 0.6 oz/week    1 Cans of beer per week     Comment: occ     Allergies   Bee venom; Bee venom; Chocolate; Chocolate; Mosquito (diagnostic); Mosquito (diagnostic); Orange fruit [citrus]; Oregano [origanum oil]; Penicillins; Penicillins; Dilantin [phenytoin sodium extended]; Garlic; Tramadol; Dilantin [phenytoin sodium extended]; Garlic; Orange fruit [citrus]; Oregano [origanum oil]; Pork-derived products; Tramadol; Latex; and Tape   Review of Systems Review of Systems  Constitutional: Negative for fever.  Musculoskeletal: Positive for arthralgias (right thigh and knee) and joint swelling.  Skin: Negative for wound.  Neurological: Positive for numbness (right foot).     Physical Exam Updated Vital Signs BP 109/71 (BP Location: Left Arm)   Pulse 88   Temp 98.4 F (36.9 C) (Oral)   Resp  18   Ht 5\' 4"  (1.626 m)   Wt 59 kg   LMP 06/04/2016   SpO2 99%   BMI 22.31 kg/m   Physical Exam  Constitutional: She is oriented to person, place, and time. She appears well-developed and well-nourished.  HENT:  Head: Normocephalic and atraumatic.  Eyes: Conjunctivae and EOM are normal. Right eye exhibits no discharge. Left eye exhibits no discharge. No scleral icterus.  Neck: Normal range of motion. Neck supple.  Cardiovascular: Normal rate and intact distal pulses.   Pulmonary/Chest: Effort normal.  Abdominal: Soft. She exhibits no  distension.  Musculoskeletal: She exhibits tenderness.       Right knee: She exhibits decreased range of motion (due to pain) and swelling. She exhibits no ecchymosis, no deformity, no laceration and no erythema. Tenderness (diffuse) found.       Right ankle: She exhibits normal range of motion, no swelling, no ecchymosis, no deformity, no laceration and normal pulse. No tenderness. Achilles tendon normal.       Right upper leg: She exhibits tenderness. She exhibits no swelling, no edema, no deformity and no laceration.       Right foot: There is normal range of motion, no tenderness, no swelling, normal capillary refill, no crepitus, no deformity and no laceration.  Diffuse tenderness with mild swelling over right knee and distal right thigh, worse with ROM. Pt unable to fully extend or flex right knee due to pain. FROM of right ankle and foot. Sensation grossly intact however pt reports tingling sensation with light palpation of right foot. 2+ DP and PT pulse. Cap refill <2. Pt unable to to stand and bear weight on right leg due to pain.  Neurological: She is alert and oriented to person, place, and time.  Nursing note and vitals reviewed.    ED Treatments / Results  Labs (all labs ordered are listed, but only abnormal results are displayed) Labs Reviewed - No data to display  EKG  EKG Interpretation None       Radiology Dg Knee Complete 4  Views Right  Result Date: 06/11/2016 CLINICAL DATA:  Progressive right knee for 2 days.  No known injury. EXAM: RIGHT KNEE - COMPLETE 4+ VIEW COMPARISON:  None. FINDINGS: No evidence of fracture, dislocation, or joint effusion. No evidence of arthropathy or other focal bone abnormality. Soft tissues are unremarkable. IMPRESSION: Negative. Electronically Signed   By: Kathreen Devoid   On: 06/11/2016 09:50   Dg Femur Min 2 Views Right  Result Date: 06/11/2016 CLINICAL DATA:  Right knee pain for 2 days.  No reported injury. EXAM: RIGHT FEMUR 2 VIEWS COMPARISON:  None. FINDINGS: There is no evidence of fracture or other focal bone lesions. Soft tissues are unremarkable. IMPRESSION: Negative. Electronically Signed   By: Ilona Sorrel M.D.   On: 06/11/2016 09:53    Procedures Procedures (including critical care time)  Medications Ordered in ED Medications  ibuprofen (ADVIL,MOTRIN) tablet 600 mg (600 mg Oral Given 06/11/16 0933)     Initial Impression / Assessment and Plan / ED Course  I have reviewed the triage vital signs and the nursing notes.  Pertinent labs & imaging results that were available during my care of the patient were reviewed by me and considered in my medical decision making (see chart for details).  Clinical Course    Patient presents with right knee and thigh pain that started 2 days ago after she was walking and felt her knee dislocate resulting her reducing her knee herself. Reports history of right knee location 10 years ago and states that her knee has continued to dislocate weekly over the past few years. Denies any other recent fall or injury. VSS. Exam revealed mild swelling of right knee with diffuse tenderness of right knee and distal femur. Patient unable to fully extend or flex right knee due to reported exquisite pain. Right knee exam limited to due pain with light palpation or movement of knee; unable to assess ligaments. Sensation grossly intact. 2+ DP pulse. Full  range of motion of right foot and ankle. Right knee and femur  x-ray negative. Patient placed in knee immobilizer in the ED and given crutches. Discussed results and plan for discharge with patient. Plan to discharge patient home with symptomatic treatment including NSAIDs, cryotherapy and advised to follow-up with orthopedics if her symptoms have not improved. Discussed return precautions.  Final Clinical Impressions(s) / ED Diagnoses   Final diagnoses:  Knee pain, right    New Prescriptions New Prescriptions   IBUPROFEN (ADVIL,MOTRIN) 600 MG TABLET    Take 1 tablet (600 mg total) by mouth every 6 (six) hours as needed.     Chesley Noon [place], [place] 06/11/16 [place], MD 06/11/16 1714

## 2016-06-11 NOTE — ED Notes (Signed)
Patient transported to X-ray 

## 2016-06-11 NOTE — ED Triage Notes (Signed)
Pt reports progressive pain in right knee x 2 days and right foot "feels numb". Denies new injury. States dislocated same knee 10 years ago but hasn't had any problems with it since

## 2016-06-13 ENCOUNTER — Other Ambulatory Visit: Payer: Self-pay | Admitting: Internal Medicine

## 2016-06-13 MED FILL — LEVETIRACETAM ER 500 MG TAB: 500 | 30 days supply | Qty: 90 | Fill #1

## 2016-07-07 ENCOUNTER — Ambulatory Visit: Payer: Self-pay | Admitting: Neurology

## 2016-07-15 ENCOUNTER — Ambulatory Visit (INDEPENDENT_AMBULATORY_CARE_PROVIDER_SITE_OTHER): Payer: Self-pay | Admitting: Neurology

## 2016-07-15 ENCOUNTER — Encounter: Payer: Self-pay | Admitting: Neurology

## 2016-07-15 VITALS — BP 102/65 | HR 98 | Ht 64.0 in | Wt 124.2 lb

## 2016-07-15 DIAGNOSIS — G40301 Generalized idiopathic epilepsy and epileptic syndromes, not intractable, with status epilepticus: Secondary | ICD-10-CM

## 2016-07-15 DIAGNOSIS — Z79899 Other long term (current) drug therapy: Secondary | ICD-10-CM

## 2016-07-15 DIAGNOSIS — G40309 Generalized idiopathic epilepsy and epileptic syndromes, not intractable, without status epilepticus: Secondary | ICD-10-CM

## 2016-07-15 DIAGNOSIS — F445 Conversion disorder with seizures or convulsions: Secondary | ICD-10-CM

## 2016-07-15 MED ORDER — LEVETIRACETAM ER 500 MG PO TB24: 1500.0000 mg | ORAL_TABLET | Freq: Two times a day (BID) | ORAL | 3 refills | Status: DC

## 2016-07-15 MED ORDER — LACOSAMIDE 150 MG PO TABS: 150.0000 mg | ORAL_TABLET | Freq: Two times a day (BID) | ORAL | 5 refills | Status: DC

## 2016-07-15 MED ORDER — LACOSAMIDE 150 MG PO TABS: 150.0000 mg | ORAL_TABLET | Freq: Two times a day (BID) | ORAL | 5 refills | Status: DC | PRN

## 2016-07-15 MED FILL — LEVETIRACETAM ER 500 MG TAB: 500 | 30 days supply | Qty: 180 | Fill #0

## 2016-07-15 NOTE — Patient Instructions (Addendum)
Remember to drink plenty of fluid, eat healthy meals and do not skip any meals. Try to eat protein with a every meal and eat a healthy snack such as fruit or nuts in between meals. Try to keep a regular sleep-wake schedule and try to exercise daily, particularly in the form of walking, 20-30 minutes a day, if you can.   As far as your medications are concerned, I would like to suggest: Keppra (levetiracetam) 1500mg (3 pills) twice a day. Vimpat(lacosamide) 150mg (one pill) twice daily.  I would like to see you back in 3 months, sooner if we need to. Please call us with any interim questions, concerns, problems, updates or refill requests.   Our phone number is [phone]. We also have an after hours call service for urgent matters and there is a physician on-call for urgent questions. For any emergencies you know to call 911 or go to the nearest emergency room  Lacosamide tablets What is this medicine? LACOSAMIDE (la KOE sa mide) is used to control seizures caused by certain types of epilepsy. This medicine may be used for other purposes; ask your health care provider or pharmacist if you have questions. What should I tell my health care provider before I take this medicine? They need to know if you have any of these conditions: -dehydration -heart disease, including heart failure -history of a drug or alcohol abuse problem -kidney disease -liver disease -suicidal thoughts, plans, or attempt; a previous suicide attempt by you or a family member -an unusual or allergic reaction to lacosamide, other medicines, foods, dyes, or preservatives -pregnant or trying to get pregnant -breast-feeding How should I use this medicine? Take this medicine by mouth with a glass of water. You can take it with or without food. Follow the directions on the prescription label. Take your doses at regular intervals. Do not take your medicine more often than directed. Do not stop taking except on the advice of your  doctor or health care professional. A special MedGuide will be given to you by the pharmacist with each prescription and refill. Be sure to read this information carefully each time. Talk to your pediatrician regarding the use of this medicine in children. Special care may be needed. Overdosage: If you think you have taken too much of this medicine contact a poison control center or emergency room at once. NOTE: This medicine is only for you. Do not share this medicine with others. What if I miss a dose? If you miss a dose, take it as soon as you can. If it is almost time for your next dose, take only that dose. Do not take double or extra doses. What may interact with this medicine? -atazanavir -beta-blockers like metoprolol and propranolol -calcium channel blockers like diltiazem and verapamil -digoxin -dronedarone -lopinavir/ritonavir This list may not describe all possible interactions. Give your health care provider a list of all the medicines, herbs, non-prescription drugs, or dietary supplements you use. Also tell them if you smoke, drink alcohol, or use illegal drugs. Some items may interact with your medicine. What should I watch for while using this medicine? Visit your doctor or health care professional for regular checks on your progress. This medicine needs careful monitoring. Wear a medical ID bracelet or chain, and carry a card that describes your disease and details of your medicine and dosage times. You may get drowsy or dizzy. Do not drive, use machinery, or do anything that needs mental alertness until you know how this medicine affects you. Do  not stand or sit up quickly, especially if you are an older patient. This reduces the risk of dizzy or fainting spells. Alcohol may interfere with the effect of this medicine. Avoid alcoholic drinks. The use of this medicine may increase the chance of suicidal thoughts or actions. Pay special attention to how you are responding while on  this medicine. Any worsening of mood, or thoughts of suicide or dying should be reported to your health care professional right away. Women who become pregnant while using this medicine may enroll in the [place] Pregnancy Registry by calling [phone]. This registry collects information about the safety of antiepileptic drug use during pregnancy. What side effects may I notice from receiving this medicine? Side effects that you should report to your doctor or health care professional as soon as possible: -allergic reactions like skin rash, itching or hives, swelling of the face, lips, or tongue -confusion -feeling faint or lightheaded, falls -fever -irregular heart beat -loss of memory -suicidal thoughts or other mood changes -unusually weak or tired -yellowing of the eyes, skin Side effects that usually do not require medical attention (report to your doctor or health care professional if they continue or are bothersome): -constipation -diarrhea -drowsiness -dry mouth -headache -nausea This list may not describe all possible side effects. Call your doctor for medical advice about side effects. You may report side effects to FDA at 1-800-FDA-1088. Where should I keep my medicine? Keep out of the reach of children. This medicine can be abused. Keep your medicine in a safe place to protect it from theft. Do not share this medicine with anyone. Selling or giving away this medicine is dangerous and against the law. This medicine may cause accidental overdose and death if it taken by other adults, children, or pets. Mix any unused medicine with a substance like cat litter or coffee grounds. Then throw the medicine away in a sealed container like a sealed bag or a coffee can with a lid. Do not use the medicine after the expiration date. Store at room temperature between 15 and 30 degrees C (59 and 86 degrees F). NOTE: This sheet is a summary. It may not cover all  possible information. If you have questions about this medicine, talk to your doctor, pharmacist, or health care provider.    2016, [employer]. (2013-10-28 15:32:37)   Levetiracetam extended-release tablets What is this medicine? LEVETIRACETAM (lee ve tye RA se tam) is an antiepileptic drug. It is used with other medicines to treat certain types of seizures. This medicine may be used for other purposes; ask your health care provider or pharmacist if you have questions. What should I tell my health care provider before I take this medicine? They need to know if you have any of these conditions: -kidney disease -suicidal thoughts, plans, or attempt; a previous suicide attempt by you or a family member -an unusual or allergic reaction to levetiracetam, other medicines, foods, dyes, or preservatives -pregnant or trying to get pregnant -breast-feeding How should I use this medicine? Take this medicine by mouth with a glass of water. Follow the directions on the prescription label. Do not cut, crush or chew this medicine. You may take this medicine with or without food. Take your doses at regular intervals. Do not take your medicine more often than directed. Do not stop taking this medicine or any of your seizure medicines unless instructed by your doctor or health care professional. Stopping your medicine suddenly can increase your seizures or  their severity. A special MedGuide will be given to you by the pharmacist with each prescription and refill. Be sure to read this information carefully each time. Contact your pediatrician or health care professional regarding the use of this medication in children. While this drug may be prescribed for children as young as 29 years of age for selected conditions, precautions do apply. Overdosage: If you think you have taken too much of this medicine contact a poison control center or emergency room at once. NOTE: This medicine is only for you. Do  not share this medicine with others. What if I miss a dose? If you miss a dose and it has only been a few hours, take it as soon as you can. If it is almost time for your next dose, take only that dose. Do not take double or extra doses. What may interact with this medicine? This medicine may interact with the following medications: -carbamazepine -colesevelam -probenecid -sevelamer This list may not describe all possible interactions. Give your health care provider a list of all the medicines, herbs, non-prescription drugs, or dietary supplements you use. Also tell them if you smoke, drink alcohol, or use illegal drugs. Some items may interact with your medicine. What should I watch for while using this medicine? Visit your doctor or health care professional for a regular check on your progress. Wear a medical identification bracelet or chain to say you have epilepsy, and carry a card that lists all your medications. It is important to take this medicine exactly as instructed by your health care professional. When first starting treatment, your dose may need to be adjusted. It may take weeks or months before your dose is stable. You should contact your doctor or health care professional if your seizures get worse or if you have any new types of seizures. You may get drowsy or dizzy. Do not drive, use machinery, or do anything that needs mental alertness until you know how this medicine affects you. Do not stand or sit up quickly, especially if you are an older patient. This reduces the risk of dizzy or fainting spells. Alcohol may interfere with the effect of this medicine. Avoid alcoholic drinks. The use of this medicine may increase the chance of suicidal thoughts or actions. Pay special attention to how you are responding while on this medicine. Any worsening of mood, or thoughts of suicide or dying should be reported to your health care professional right away. The tablet shell for some brands of  this medicine does not dissolve. This is normal. The tablet shell may appear in the stool. This is not cause for concern. Women who become pregnant while using this medicine may enroll in the [place] Pregnancy Registry by calling [phone]. This registry collects information about the safety of antiepileptic drug use during pregnancy. What side effects may I notice from receiving this medicine? Side effects you should report to your doctor or health care professional as soon as possible: -allergic reactions like skin rash, itching or hives, swelling of the face, lips, or tongue -breathing problems -changes in emotions or moods -dark urine -general ill feeling or flu-like symptoms -problems with balance, talking, walking -suicidal thoughts or actions -unusually weak or tired -yellowing of the eyes or skin Side effects that usually do not require medical attention (report to your doctor or health care professional if they continue or are bothersome): -diarrhea -dizzy, drowsy -headache -loss of appetite This list may not describe all possible side effects. Call  your doctor for medical advice about side effects. You may report side effects to FDA at 1-800-FDA-1088. Where should I keep my medicine? Keep out of reach of children. Store at room temperature between 15 and 30 degrees C (59 and 86 degrees F). Throw away any unused medicine after the expiration date. NOTE: This sheet is a summary. It may not cover all possible information. If you have questions about this medicine, talk to your doctor, pharmacist, or health care provider.    2016, [employer]. (2014-12-25 10:13:38)
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
## 2016-07-15 NOTE — Progress Notes (Signed)
Faxed printed rx lacosamide to pt pharmacy ([facility]  [phone]. Received confirmation.

## 2016-07-16 ENCOUNTER — Telehealth: Payer: Self-pay | Admitting: Neurology

## 2016-07-16 NOTE — Telephone Encounter (Addendum)
Called and spoke to [place]. She stated pt rx vimpat is going to cost her $800/month. She could try and submit pt for pt assistance, but she stated they are short staffed and it could take a couple months. She stated we could try and submit pt assistance, but it could still take several weeks to months to process. She can give pt a 14 day trial, but no guarantee she will have medication from pt assistance in time. She stated she did not want patient to start medication from the 14-day trial and then not have medication to continue to take.  She is wondering how Dr Jaynee Eagles would like to proceed.  There is no generic available. Advised I will send message to AA, MD and call back to advise. She verbalized understanding.

## 2016-07-16 NOTE — Telephone Encounter (Signed)
Tina [name] called to advise they cannot get this medication. She said they can help her get into the PAP but that can take up to 55mths. She said they have a 14 day trial but that is all she can do.

## 2016-07-16 NOTE — Telephone Encounter (Signed)
Tried calling pharmacy back. They were closed for lunch. Will try again later.

## 2016-07-17 ENCOUNTER — Other Ambulatory Visit: Payer: Self-pay | Admitting: Neurology

## 2016-07-17 MED ORDER — DIVALPROEX SODIUM ER 500 MG PO TB24: 500.0000 mg | ORAL_TABLET | Freq: Two times a day (BID) | ORAL | 12 refills | Status: DC

## 2016-07-17 MED FILL — DIVALPROEX SOD ER 500 MG TA: 500 | 30 days supply | Qty: 60 | Fill #0

## 2016-07-17 NOTE — Telephone Encounter (Signed)
Called and LVM for pt relaying Dr Jaynee Eagles aware rx vimpat 800/month. AA, MD is going to review everything and see if there is another medication she can try. Advised we will call once she does this. She is finishing up with patient's for the day.

## 2016-07-17 NOTE — Telephone Encounter (Signed)
Patient called regarding VIMPAT, please call [phone].

## 2016-07-17 NOTE — Telephone Encounter (Signed)
I will add Depakote instead of vimpat. Do not drink alcohol. Limit tylenol use. Patient is not sexually active with men, still advised of its teratogenicity and not to get pregnant. She is not planning on having any children currently. Discussed teratogenicity of Depakote, do not get pregnant. Discussed side effects Discussed the side effects of Depakote including serious reactions which can include hepatotoxicity, pancreatitis, hyponatremia, pancytopenia, thrombocytopenia. She is to stop for any concerning symptoms especially rash, suicidality, psychosis and hallucinations., common reactions include headache, nausea vomiting, somnolence, thrombocytopenia, dyspepsia, dizziness, diarrhea, abdominal pain, tremor, alopecia, weight changes, appetite changes, constipation and other side effects. Please can stop for anything concerning.

## 2016-07-20 ENCOUNTER — Telehealth: Payer: Self-pay | Admitting: Neurology

## 2016-07-20 NOTE — Telephone Encounter (Signed)
Patient called on 07/18/2016 regarding allergic reaction to Depakote. She says she took her first dose and started itching. She was advised to discontinue medication and take an allergy medication which she had already done before calling. She was advised to monitor her symptoms. Should she have a rash or get worse she was advised to immediately go to the emergency room or call 911. Otherwise she could monitor her symptoms and I advised her that I would notify her provider. She demonstrated understanding and agreement.

## 2016-07-21 ENCOUNTER — Other Ambulatory Visit: Payer: Self-pay | Admitting: Neurology

## 2016-07-21 ENCOUNTER — Telehealth: Payer: Self-pay | Admitting: Neurology

## 2016-07-21 MED ORDER — OXCARBAZEPINE 300 MG PO TABS: ORAL_TABLET | ORAL | 11 refills | Status: DC

## 2016-07-21 NOTE — Telephone Encounter (Signed)
   [name], I would like to try Trileptal. She has been on it in the past. Would you call and ask if she is ok with Trileptal, and if she didn't have any problems on it in the past? I think she was on it for mood and not for seizures.  It's generic name is Oxcarbazepine. Would like to start at 300mg  bid. Let me know if she is ok with this thanks.  Side effects can include:   Oxcarbazepine tablets  What should I watch for while using this medicine?  - Rarely, serious skin allergic reactions may occur with this medicine. If you develop a skin rash, redness, itching, peeling skin inside your mouth, swollen glands, or a fever while taking this medicine, contact your health care provider immediately. - You may get drowsy or dizzy. Do not drive, use machinery, or do anything that needs mental alertness until you know how this drug affects you.  Alcohol can make you more drowsy and dizzy. Avoid alcoholic drinks. - The use of this medicine may increase the chance of suicidal thoughts or actions. Pay special attention to how you are responding while on this medicine. Any worsening of mood, or thoughts of suicide or dying should be reported to your health care professional right away.  What side effects may I notice from receiving this medicine? Side effects that you should report to your doctor or health care professional as soon as possible: -allergic reactions such as skin rash or itching, hives, swelling of the lips, mouth, tongue, or throat -changes in vision -confusion -difficulty passing urine or change in the amount of urine -fever -infection -nausea, vomiting -problems with balance, speaking, walking -redness, blistering, peeling or loosening of the skin, including inside the mouth -swelling of feet, hands -unusual bleeding, bruising -unusually weak or tired -worsening of mood, thoughts or actions of suicide or dying -yellowing of eyes, skin Side effects that usually do not require medical  attention (report to your doctor or health care professional if they continue or are bothersome): -constipation or diarrhea -headache -loss of appetite -nervous -stomach upset -tremors -trouble sleeping This list may not describe all possible side effects. Call your doctor for medical advice about side effects. You may report side effects to FDA at 1-800-FDA-1088.

## 2016-07-21 NOTE — Telephone Encounter (Signed)
Called pt. Relayed Dr Jaynee Eagles message. Patient stated she is feeling better. She is taking benadryl with depakote and is doing ok. She has had no further problems. She is agreeable to try trileptal. I advised AA, MD would like to try her in 300mg  BID. Went over [employer] of medication with pt. Advised her to stop medication for anything concerning and call our office. More serious SE, CP, trouble breathing, hives/rash she should proceed to ER. She verbalized understanding.

## 2016-07-21 NOTE — Telephone Encounter (Signed)
Patient called, states Dr. Jaynee Eagles mentioned a procedure, to implant device in chest that will stop seizures. Patient is interested in proceeding with this. Please call.

## 2016-07-21 NOTE — Progress Notes (Signed)
Patient has been on it before and denies any side effects to Trileptal thanks

## 2016-07-21 NOTE — Telephone Encounter (Signed)
Dr Ahern- please advise 

## 2016-07-30 ENCOUNTER — Telehealth: Payer: Self-pay | Admitting: Neurology

## 2016-07-30 NOTE — Telephone Encounter (Signed)
Called patient back. She has not had any problems with her blood sugar since last event. Last seizure per patient was last week, 07/23/16. Lasted 10 minutes. No injuries after, just sleepy. She states she is very tired all the time. Last night she was asleep and really sweaty and scared. She was not having a bad dream. She states she was dreaming that she was "in a night club performing". She said this has not happened before. Verified she is taking keppra 3 tablets 2 times daily and depakote 1 tablet 2 times daily. Advised Dr Jaynee Eagles still seeing pt but I will send message to her and call back tomorrow at the latest to advise. She verbalized understanding.

## 2016-07-30 NOTE — Telephone Encounter (Signed)
Pt returned [name]'s call °

## 2016-07-30 NOTE — Telephone Encounter (Signed)
Pt called to advise that 2 or 3 nights ago she was lying in bed watching TV, she awoke to EMS giving her sugar-glucose was down to 67, BP above 100's, pulse 89. She said her sons were with her. She said she has an appt with [facility] 12/8. Please call

## 2016-07-30 NOTE — Telephone Encounter (Signed)
LVM returning pt call. Gave [facility] phone number.  

## 2016-08-04 NOTE — Telephone Encounter (Signed)
Left several messages. Will try again.

## 2016-08-05 NOTE — Telephone Encounter (Addendum)
Spoke to patient - states she is feeling better - no further issues with blood sugar or seizures.  She is only taking Depakote ER 500mg , one tab BID and Keppra XR 500mg , three tabs BID.  She has not started Trileptal yet (states she has just has not went to the pharmacy yet).

## 2016-08-05 NOTE — Telephone Encounter (Signed)
Can you see how she is doing? And is she taking trileptal or the depakote?

## 2016-08-06 NOTE — Telephone Encounter (Signed)
She does not have to take trileptal if she is taking the Depakote. Please let her know. I was going to switch her from depakote to trileptal because she told me she had a reaction to Depakote. If she is ok wtaking the depakote now jus stay on the [place] and depakote thank you !!

## 2016-08-06 NOTE — Telephone Encounter (Signed)
Spoke to patient again - states she has to take Benadryl with Depakote so she would like to switch to Trileptal. Per Dr. Jaynee Eagles, she will need to stop Depakote when she starts Trileptal.  She will continue Keppra. Pt verbalized understanding.  She will call us back with any concerns.

## 2016-08-19 ENCOUNTER — Telehealth: Payer: Self-pay | Admitting: Neurology

## 2016-08-19 MED ORDER — OXCARBAZEPINE 300 MG PO TABS: ORAL_TABLET | ORAL | 11 refills | Status: DC

## 2016-08-19 MED FILL — LEVETIRACETAM ER 500 MG TAB: 500 | 30 days supply | Qty: 180 | Fill #1

## 2016-08-19 MED FILL — $BREO ELLIPTA 200-25 MCG IN: 200-25 | 30 days supply | Qty: 60 | Fill #2

## 2016-08-19 MED FILL — OXcarbazepine 300 MG TABS: 300 | 30 days supply | Qty: 120 | Fill #0

## 2016-08-19 NOTE — Telephone Encounter (Signed)
Pt request refill for Oxcarbazepine (TRILEPTAL) 300 MG tablet sent to [place], advised she does not have transportation

## 2016-08-19 NOTE — Telephone Encounter (Signed)
[facility]

## 2016-09-03 ENCOUNTER — Emergency Department (HOSPITAL_COMMUNITY): Payer: Self-pay

## 2016-09-03 ENCOUNTER — Emergency Department (HOSPITAL_COMMUNITY)
Admission: EM | Admit: 2016-09-03 | Discharge: 2016-09-03 | Disposition: A | Discharge status: Home / Self Care | Payer: Self-pay | Attending: Emergency Medicine | Admitting: Emergency Medicine

## 2016-09-03 ENCOUNTER — Encounter (HOSPITAL_COMMUNITY): Payer: Self-pay | Admitting: Emergency Medicine

## 2016-09-03 DIAGNOSIS — F17290 Nicotine dependence, other tobacco product, uncomplicated: Secondary | ICD-10-CM | POA: Insufficient documentation

## 2016-09-03 DIAGNOSIS — J45909 Unspecified asthma, uncomplicated: Secondary | ICD-10-CM | POA: Insufficient documentation

## 2016-09-03 DIAGNOSIS — W19XXXA Unspecified fall, initial encounter: Secondary | ICD-10-CM

## 2016-09-03 DIAGNOSIS — Z91040 Latex allergy status: Secondary | ICD-10-CM | POA: Insufficient documentation

## 2016-09-03 DIAGNOSIS — Z79899 Other long term (current) drug therapy: Secondary | ICD-10-CM | POA: Insufficient documentation

## 2016-09-03 DIAGNOSIS — Z8673 Personal history of transient ischemic attack (TIA), and cerebral infarction without residual deficits: Secondary | ICD-10-CM | POA: Insufficient documentation

## 2016-09-03 DIAGNOSIS — R569 Unspecified convulsions: Secondary | ICD-10-CM

## 2016-09-03 DIAGNOSIS — G40909 Epilepsy, unspecified, not intractable, without status epilepticus: Secondary | ICD-10-CM | POA: Insufficient documentation

## 2016-09-03 DIAGNOSIS — G40301 Generalized idiopathic epilepsy and epileptic syndromes, not intractable, with status epilepticus: Secondary | ICD-10-CM

## 2016-09-03 LAB — CBC WITH DIFFERENTIAL/PLATELET
BASOS ABS: 0 10*3/uL (ref 0.0–0.1)
Basophils Relative: 0 %
Eosinophils Absolute: 0.1 10*3/uL (ref 0.0–0.7)
Eosinophils Relative: 1 %
HEMATOCRIT: 35.3 % — AB (ref 36.0–46.0)
Hemoglobin: 12.4 g/dL (ref 12.0–15.0)
LYMPHS PCT: 42 %
Lymphs Abs: 2.2 10*3/uL (ref 0.7–4.0)
MCH: 32.9 pg (ref 26.0–34.0)
MCHC: 35.1 g/dL (ref 30.0–36.0)
MCV: 93.6 fL (ref 78.0–100.0)
MONO ABS: 0.5 10*3/uL (ref 0.1–1.0)
Monocytes Relative: 9 %
NEUTROS ABS: 2.5 10*3/uL (ref 1.7–7.7)
Neutrophils Relative %: 48 %
PLATELETS: 151 10*3/uL (ref 150–400)
RBC: 3.77 MIL/uL — AB (ref 3.87–5.11)
RDW: 13 % (ref 11.5–15.5)
WBC: 5.2 10*3/uL (ref 4.0–10.5)

## 2016-09-03 LAB — BASIC METABOLIC PANEL
Anion gap: 8 (ref 5–15)
BUN: 10 mg/dL (ref 6–20)
CALCIUM: 8.5 mg/dL — AB (ref 8.9–10.3)
CO2: 21 mmol/L — ABNORMAL LOW (ref 22–32)
CREATININE: 0.72 mg/dL (ref 0.44–1.00)
Chloride: 109 mmol/L (ref 101–111)
Glucose, Bld: 78 mg/dL (ref 65–99)
Potassium: 3.7 mmol/L (ref 3.5–5.1)
SODIUM: 138 mmol/L (ref 135–145)

## 2016-09-03 LAB — I-STAT BETA HCG BLOOD, ED (MC, WL, AP ONLY): I-stat hCG, quantitative: <5 m[IU]/mL (ref <5)

## 2016-09-03 MED ORDER — LEVETIRACETAM ER 500 MG PO TB24: 1500.0000 mg | ORAL_TABLET | Freq: Two times a day (BID) | ORAL | 0 refills | Status: DC

## 2016-09-03 MED ORDER — LACOSAMIDE 150 MG PO TABS: 150.0000 mg | ORAL_TABLET | Freq: Two times a day (BID) | ORAL | 0 refills | Status: DC

## 2016-09-03 MED ORDER — SODIUM CHLORIDE 0.9 % IV SOLN
150.0000 mg | Freq: Once | INTRAVENOUS | Status: AC
  Administered 2016-09-03: 150 mg via INTRAVENOUS
  Filled 2016-09-03: qty 15

## 2016-09-03 MED ORDER — SODIUM CHLORIDE 0.9 % IV SOLN
1000.0000 mg | Freq: Once | INTRAVENOUS | Status: AC
  Administered 2016-09-03: 1000 mg via INTRAVENOUS
  Filled 2016-09-03: qty 10

## 2016-09-03 MED ORDER — SODIUM CHLORIDE 0.9 % IV BOLUS (SEPSIS)
1000.0000 mL | Freq: Once | INTRAVENOUS | Status: AC
  Administered 2016-09-03: 1000 mL via INTRAVENOUS

## 2016-09-03 MED ORDER — ACETAMINOPHEN 500 MG PO TABS
1000.0000 mg | ORAL_TABLET | Freq: Once | ORAL | Status: AC
  Administered 2016-09-03: 1000 mg via ORAL
  Filled 2016-09-03: qty 2

## 2016-09-03 NOTE — ED Triage Notes (Signed)
Pt brought in by EMS with c/o multiple seizures today per family  Upon EMS arrival pt was postictal  When pt came to pt was anxious and was c/o her lower extremities feeling weak and tingling in her feet bilaterally  Pt takes Keppra daily  Family reports pt had multiple seizures yesterday per her family but they did not call EMS

## 2016-09-03 NOTE — ED Notes (Signed)
Patient transported to CT 

## 2016-09-03 NOTE — Discharge Instructions (Signed)
Take your meds!

## 2016-09-03 NOTE — ED Notes (Signed)
Patient returned from CT

## 2016-09-03 NOTE — ED Provider Notes (Addendum)
[facility] DEPT Provider Note   CSN: [id number] Arrival date & time: 09/03/16  2044     History   Chief Complaint Chief Complaint  Patient presents with  . Seizures    HPI Tina [name] is a 35 y.o. female.  35 yo F with a chief complaint of a seizure. Patient has had at least 3 of these today according to her family. She had one again and they decided to call EMS. They resolved by the time EMS arrived and she was postictal. Patient continued to be postictal on my exam. Denies noncompliance though she does say that she is not taking Vimpat and that is when the medics and that was prescribed to her 15 days ago. She is complaining of tingling to bilateral lower extremities. She denies any back pain. Denies neck pain. Has a mild headache.   The history is provided by the patient.  Seizures   This is a recurrent problem. The current episode started less than 1 hour ago. The problem has been resolved. There were 2 to 3 seizures. The most recent episode lasted 30 to 120 seconds. Pertinent negatives include no headaches, no visual disturbance, no chest pain, no nausea and no vomiting. Characteristics include eye blinking and rhythmic jerking. The episode was witnessed. The seizures did not continue in the ED. The seizure(s) had no focality. Possible causes include missed seizure meds. There has been no fever.    Past Medical History:  Diagnosis Date  . Anxiety   . Asthma   . Depression   . Epilepsy ([place])   . Fibroid tumor   . Migraine   . Seizures ([place])    last seizure 05/2016  . Seizures ([place])   . Stomach ulcer   . 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 Alcohol abuse   . Seizure ([place]) 03/15/2016  . Alcohol use 03/15/2016  . Hypokalemia 03/15/2016  . Prolonged Q-T interval on ECG   . Somnolence   . 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 Asthma 02/19/2016  . 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 Non compliance with medical treatment 12/07/2015  . Gastritis 11/08/2015  . Allergic rhinitis 11/08/2015  . Epilepsy ([place])   . Major depressive disorder, recurrent ([place]) 09/10/2015  . Suicidal ideations   . Suicide attempt     Past Surgical History:  Procedure Laterality Date  . UMBILICAL HERNIA REPAIR  12/2014    OB History    Gravida Para Term Preterm AB Living   0 0 0 0 0     SAB TAB Ectopic Multiple Live Births   0 0 0           Home Medications    Prior to Admission medications   Medication Sig Start Date End Date Taking? Authorizing Provider  albuterol (PROVENTIL HFA;VENTOLIN HFA) 108 (90 Base) MCG/ACT inhaler Inhale 2 puffs into the lungs every 4 (four) hours as needed for wheezing or shortness of breath. 02/21/16  Yes Arnoldo Morale, MD  cetirizine (ZYRTEC) 10 MG tablet Take 1 tablet (10 mg total) by mouth daily as needed for allergies. Patient taking differently: Take 10 mg by mouth 2 (two) times daily.  02/19/16  Yes Arnoldo Morale, MD  fluticasone furoate-vilanterol (BREO ELLIPTA) 200-25 MCG/INH AEPB Inhale 1 puff into the lungs daily after breakfast. For Asthma 02/21/16  Yes Arnoldo Morale, MD  Oxcarbazepine (TRILEPTAL) 300 MG tablet 2 pills (600mg )  by mouth 2 (two) times daily. 08/19/16  Yes Melvenia Beam, MD  pantoprazole (PROTONIX) 20 MG tablet Take 1 tablet (20 mg total) by mouth daily. 02/19/16  Yes Arnoldo Morale, MD  bismuth subsalicylate (PEPTO-BISMOL) 262 MG/15ML suspension Take 30 mLs by mouth every 6 (six) hours as needed. Patient not taking: Reported on 09/03/2016 02/19/16   Arnoldo Morale, MD  divalproex (DEPAKOTE ER) 500 MG 24 hr tablet Take 1 tablet (500 mg total) by mouth 2 (two) times daily. Patient not taking: Reported on 09/03/2016 07/17/16   Melvenia Beam, MD  hydrOXYzine (ATARAX/VISTARIL) 25 MG tablet Take 1 tablet (25 mg total) by mouth every 6 (six) hours as needed for anxiety. Patient not taking: Reported on 09/03/2016 09/12/15   Encarnacion Slates, NP  ibuprofen (ADVIL,MOTRIN) 600 MG tablet Take 1 tablet (600 mg total) by mouth every 6 (six) hours as needed. Patient not taking: Reported on 09/03/2016 06/11/16   Nona Dell, PA-C  Lacosamide (VIMPAT) 150 MG TABS Take 1 tablet (150 mg total) by mouth 2 (two) times daily. 09/03/16 10/03/16  Deno Etienne, DO  levETIRAcetam (KEPPRA XR) 500 MG 24 hr tablet Take 3 tablets (1,500 mg total) by mouth 2 (two) times daily. 09/03/16 10/03/16  Deno Etienne, DO    Family History Family History  Problem Relation Age of Onset  . Heart failure Mother   . Cancer Father   . Seizures Father   . Drug abuse Sister   . Alcohol abuse Sister   . Alcohol abuse Sister   . Drug abuse Sister     Social History Social History  Substance Use Topics  . Smoking status: Current Some Day Smoker    Packs/day: 0.25    Years: 0.00    Types: Cigars  . Smokeless tobacco: Never Used     Comment: BLACK MILD  . Alcohol use 0.6 oz/week    1 Cans of beer per week     Comment: occ     Allergies   Bee venom; Chocolate; Mosquito (diagnostic); Orange fruit [citrus]; Oregano [origanum oil]; Penicillins; Dilantin [phenytoin sodium extended]; Garlic; Tramadol; Pork-derived products; Latex; Poractant alfa; and Tape   Review of Systems Review of Systems  Constitutional: Negative for chills and fever.  HENT: Negative for congestion and rhinorrhea.   Eyes: Negative for redness and visual disturbance.  Respiratory: Negative for shortness of breath and wheezing.   Cardiovascular: Negative for chest pain and palpitations.  Gastrointestinal: Negative for nausea and vomiting.  Genitourinary: Negative for dysuria and urgency.  Musculoskeletal: Negative for arthralgias and myalgias.  Skin: Negative for pallor and wound.  Neurological: Positive for seizures. Negative for dizziness and headaches.       Tingling to bilateral feet     Physical Exam Updated Vital Signs BP 129/90 (BP Location: Left Arm)   Pulse  63   Temp 98 F (36.7 C) (Oral)   Resp 18   Ht 5\' 4"  (1.626 m)   Wt 125 lb (56.7 kg)   LMP 09/03/2016   SpO2 100%   BMI 21.46 kg/m   Physical Exam  Constitutional: She appears well-developed and well-nourished. No distress.  HENT:  Head: Normocephalic and atraumatic.  0.5cm lac to the forehead between the eyes  Eyes: EOM are normal. Pupils are equal, round, and reactive to light.  Neck: Normal range of motion. Neck supple.  Cardiovascular: Normal rate and regular rhythm.  Exam reveals no gallop and no friction rub.   No murmur heard. Pulmonary/Chest: Effort  normal. She has no wheezes. She has no rales.  Abdominal: Soft. She exhibits no distension and no mass. There is no tenderness. There is no guarding.  Musculoskeletal: She exhibits tenderness (mild tenderness about the upper T spine). She exhibits no edema.  Neurological: She is alert.  Mildly sleepy on exam, able to answer direct questions.  PMS intact to BLE   Skin: Skin is warm and dry. She is not diaphoretic.  Psychiatric: She has a normal mood and affect. Her behavior is normal.  Nursing note and vitals reviewed.    ED Treatments / Results  Labs (all labs ordered are listed, but only abnormal results are displayed) Labs Reviewed  CBC WITH DIFFERENTIAL/PLATELET - Abnormal; Notable for the following:       Result Value   RBC 3.77 (*)    HCT 35.3 (*)    All other components within normal limits  BASIC METABOLIC PANEL - Abnormal; Notable for the following:    CO2 21 (*)    Calcium 8.5 (*)    All other components within normal limits  I-STAT BETA HCG BLOOD, ED ([facility], [facility], [facility] ONLY)    EKG  EKG Interpretation None       Radiology Dg Thoracic Spine 2 View  Result Date: 09/03/2016 CLINICAL DATA:  Multiple seizures today. Extremity weakness. Mid back pain. EXAM: THORACIC SPINE 2 VIEWS COMPARISON:  Chest radiograph Jan 14, 2016 FINDINGS: There is no evidence of thoracic spine fracture. Alignment is normal. No other  significant bone abnormalities are identified. Multilevel mild ventral endplate spurring. IMPRESSION: No acute fracture deformity or malalignment. Electronically Signed   By: Elon Alas M.D.   On: 09/03/2016 21:36   Ct Head Wo Contrast  Result Date: 09/03/2016 CLINICAL DATA:  Multiple seizures today. Extremity numbness and tingling. History of seizures, on Keppra. EXAM: CT HEAD WITHOUT CONTRAST CT CERVICAL SPINE WITHOUT CONTRAST TECHNIQUE: Multidetector CT imaging of the head and cervical spine was performed following the standard protocol without intravenous contrast. Multiplanar CT image reconstructions of the cervical spine were also generated. COMPARISON:  CT HEAD May 12, 2016 and CT cervical spine April 04, 2016 FINDINGS: CT HEAD FINDINGS BRAIN: The ventricles and sulci are normal. No intraparenchymal hemorrhage, mass effect nor midline shift. No acute large vascular territory infarcts. No abnormal extra-axial fluid collections. Basal cisterns are patent. VASCULAR: Unremarkable. SKULL/SOFT TISSUES: No skull fracture. No significant soft tissue swelling. ORBITS/SINUSES: The included ocular globes and orbital contents are normal.The mastoid aircells and included paranasal sinuses are well-aerated. OTHER: None. CT CERVICAL SPINE FINDINGS ALIGNMENT: Straightened lordosis. Vertebral bodies in alignment. SKULL BASE AND VERTEBRAE: Cervical vertebral bodies and posterior elements are intact. Intervertebral disc heights preserved. No destructive bony lesions. C1-2 articulation maintained. SOFT TISSUES AND SPINAL CANAL: Normal. DISC LEVELS: No significant osseous canal stenosis or neural foraminal narrowing. UPPER CHEST: Lung apices are clear. OTHER: None. IMPRESSION: Normal CT HEAD. Normal CT cervical spine. Electronically Signed   By: Elon Alas M.D.   On: 09/03/2016 21:35   Ct Cervical Spine Wo Contrast  Result Date: 09/03/2016 CLINICAL DATA:  Multiple seizures today. Extremity numbness and  tingling. History of seizures, on Keppra. EXAM: CT HEAD WITHOUT CONTRAST CT CERVICAL SPINE WITHOUT CONTRAST TECHNIQUE: Multidetector CT imaging of the head and cervical spine was performed following the standard protocol without intravenous contrast. Multiplanar CT image reconstructions of the cervical spine were also generated. COMPARISON:  CT HEAD May 12, 2016 and CT cervical spine April 04, 2016 FINDINGS: CT HEAD FINDINGS BRAIN:  The ventricles and sulci are normal. No intraparenchymal hemorrhage, mass effect nor midline shift. No acute large vascular territory infarcts. No abnormal extra-axial fluid collections. Basal cisterns are patent. VASCULAR: Unremarkable. SKULL/SOFT TISSUES: No skull fracture. No significant soft tissue swelling. ORBITS/SINUSES: The included ocular globes and orbital contents are normal.The mastoid aircells and included paranasal sinuses are well-aerated. OTHER: None. CT CERVICAL SPINE FINDINGS ALIGNMENT: Straightened lordosis. Vertebral bodies in alignment. SKULL BASE AND VERTEBRAE: Cervical vertebral bodies and posterior elements are intact. Intervertebral disc heights preserved. No destructive bony lesions. C1-2 articulation maintained. SOFT TISSUES AND SPINAL CANAL: Normal. DISC LEVELS: No significant osseous canal stenosis or neural foraminal narrowing. UPPER CHEST: Lung apices are clear. OTHER: None. IMPRESSION: Normal CT HEAD. Normal CT cervical spine. Electronically Signed   By: Elon Alas M.D.   On: 09/03/2016 21:35    Procedures Procedures (including critical care time)  Medications Ordered in ED Medications  levETIRAcetam (KEPPRA) 1,000 mg in sodium chloride 0.9 % 100 mL IVPB (0 mg Intravenous Stopped 09/03/16 2159)  sodium chloride 0.9 % bolus 1,000 mL (0 mLs Intravenous Stopped 09/03/16 2245)  lacosamide (VIMPAT) 150 mg in sodium chloride 0.9 % 25 mL IVPB (150 mg Intravenous Given 09/03/16 2159)  acetaminophen (TYLENOL) tablet 1,000 mg (1,000 mg Oral Given  09/03/16 2252)     Initial Impression / Assessment and Plan / ED Course  I have reviewed the triage vital signs and the nursing notes.  Pertinent labs & imaging results that were available during my care of the patient were reviewed by me and considered in my medical decision making (see chart for details).  Clinical Course     35 yo F With a chief complaint of a seizure. Patient has had multiple of these today. We'll load her with Keppra and Vimpat. She has been noncompliant with her Vimpat. With her feeling in her feet will obtain a CT head C spine.  Imaging studies are negative. Patient continues to say that she is having some paresthesias to bilateral lower extremities. Still continues to be neurologically intact. Able to ambulate without difficulty. Discharge home.  11:50 PM:  I have discussed the diagnosis/risks/treatment options with the patient and believe the pt to be eligible for discharge home to follow-up with Neurology. We also discussed returning to the ED immediately if new or worsening sx occur. We discussed the sx which are most concerning (e.g., fever, seizure) that necessitate immediate return. Medications administered to the patient during their visit and any new prescriptions provided to the patient are listed below.  Medications given during this visit Medications  levETIRAcetam (KEPPRA) 1,000 mg in sodium chloride 0.9 % 100 mL IVPB (0 mg Intravenous Stopped 09/03/16 2159)  sodium chloride 0.9 % bolus 1,000 mL (0 mLs Intravenous Stopped 09/03/16 2245)  lacosamide (VIMPAT) 150 mg in sodium chloride 0.9 % 25 mL IVPB (150 mg Intravenous Given 09/03/16 2159)  acetaminophen (TYLENOL) tablet 1,000 mg (1,000 mg Oral Given 09/03/16 2252)     The patient appears reasonably screen and/or stabilized for discharge and I doubt any other medical condition or other [facility] requiring further screening, evaluation, or treatment in the ED at this time prior to discharge.    Final  Clinical Impressions(s) / ED Diagnoses   Final diagnoses:  Seizure [facility])    New Prescriptions Discharge Medication List as of 09/03/2016 10:45 PM    START taking these medications   Details  Lacosamide (VIMPAT) 150 MG TABS Take 1 tablet (150 mg total) by mouth 2 (two)  times daily., Starting Wed 09/03/2016, Until Fri 10/03/2016, Print         Deno Etienne, DO 09/03/16 [place], DO 09/03/16 2351

## 2017-06-28 IMAGING — CR DG FEMUR 2+V*R*
4 series · 4 of 4 positions shown · non-contrast
Comparison: None.

CLINICAL DATA: Right knee pain for 2 days.  No reported injury.

EXAM:
RIGHT FEMUR 2 VIEWS

[t femur with knee ap right]
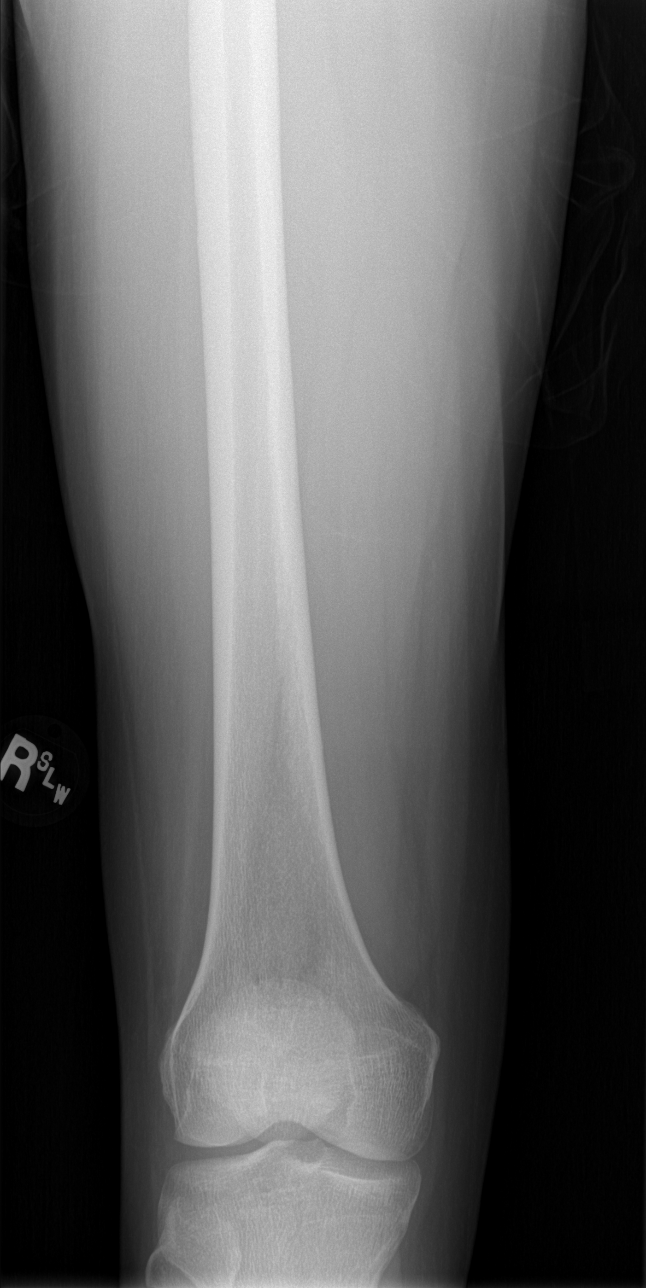

[t femur with hip lat right]
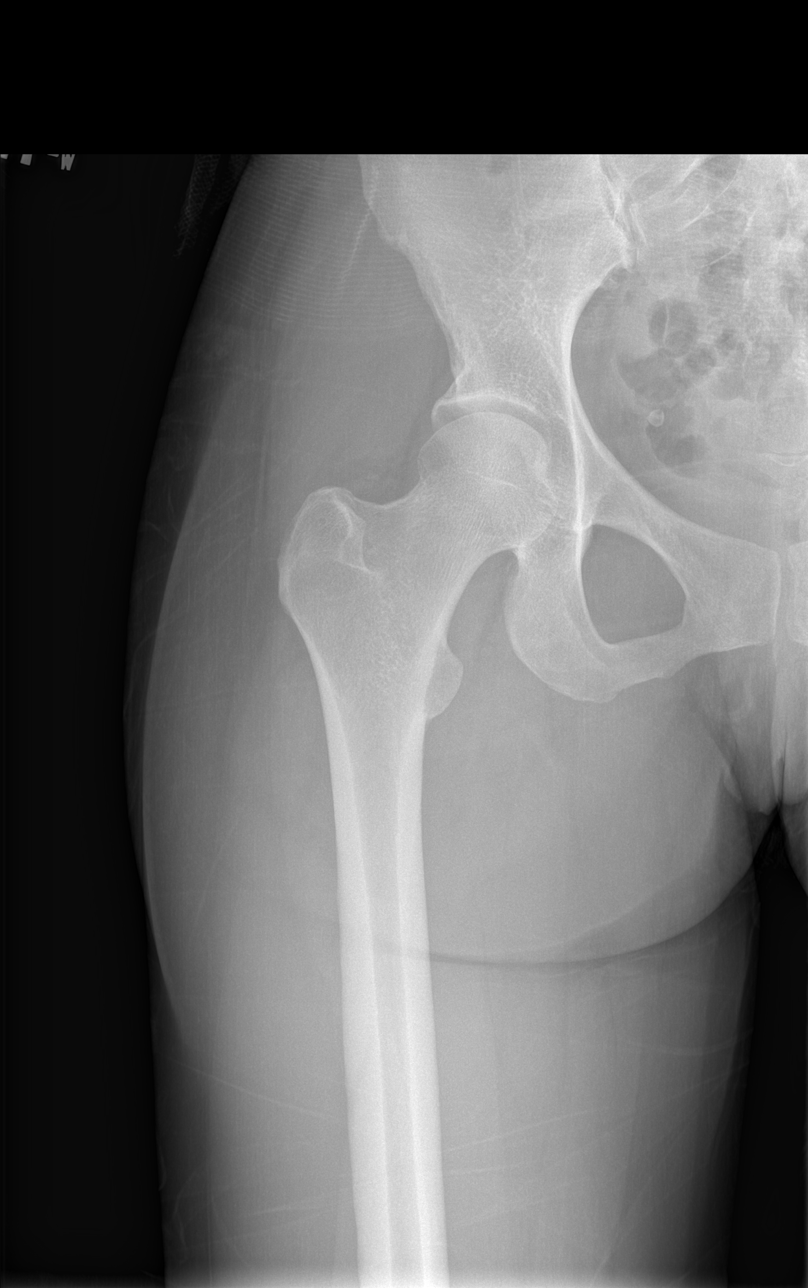

[t femur with knee lat right (1 of 2)]
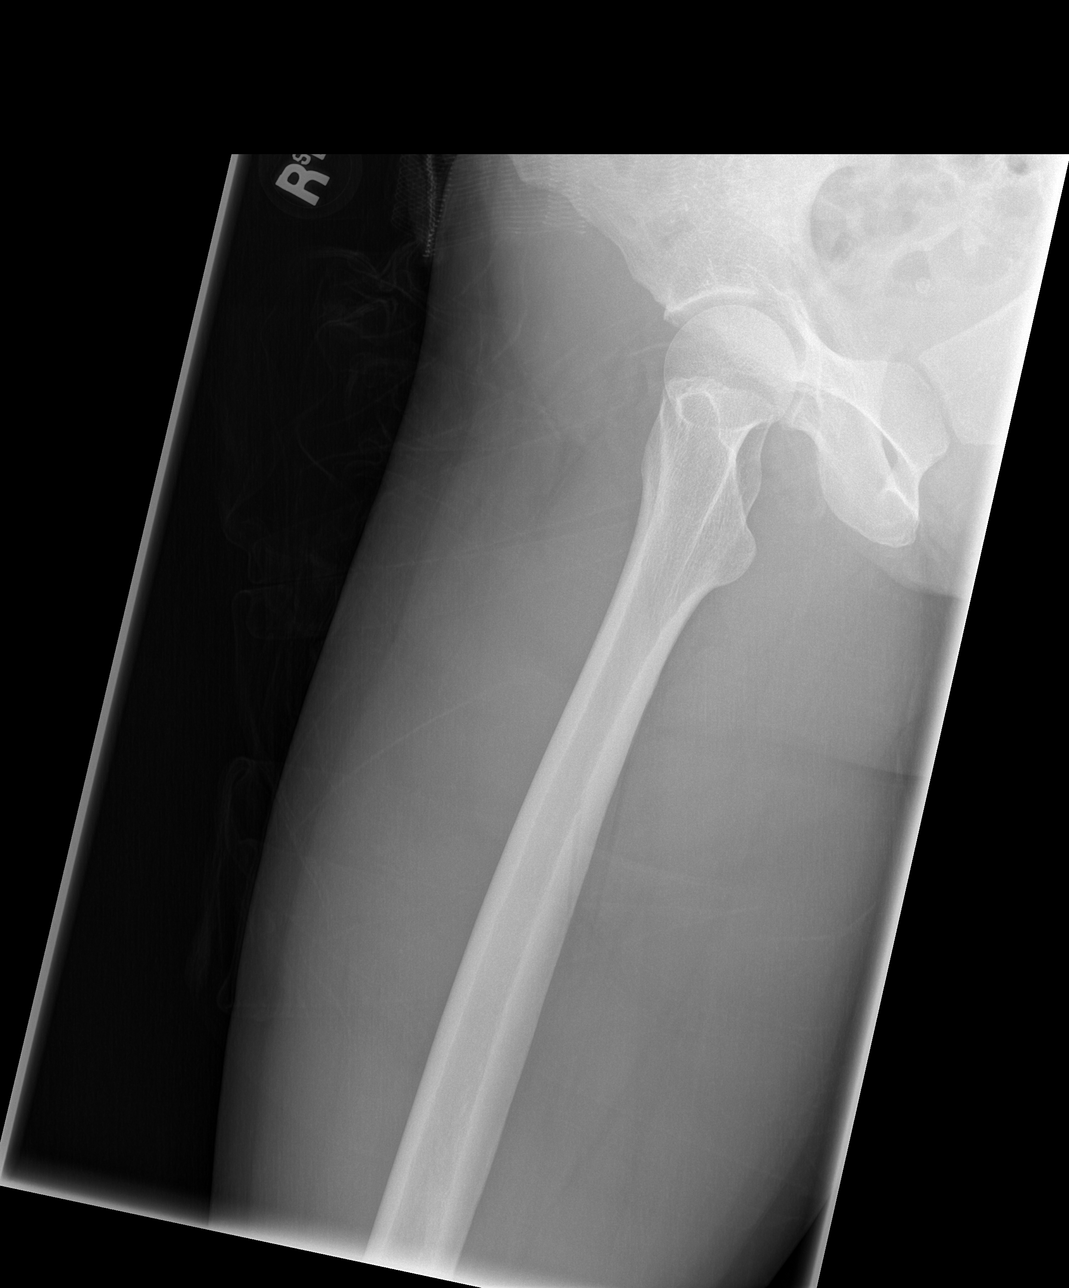

[t femur with knee lat right (2 of 2)]
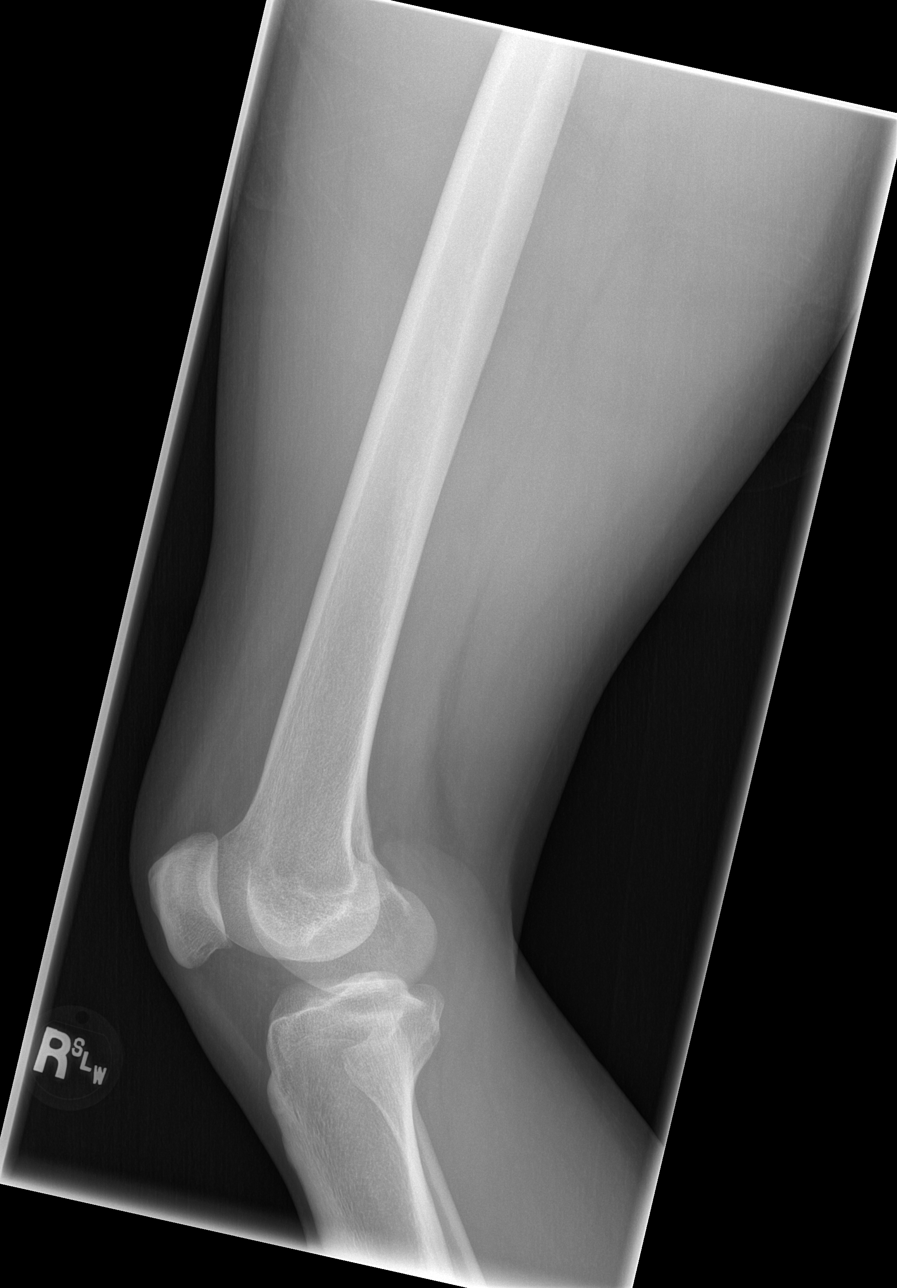

[4 of 4 positions shown; findings below may reference images not displayed]

FINDINGS: There is no evidence of fracture or other focal bone lesions. Soft
tissues are unremarkable.
IMPRESSION: Negative.

## 2017-06-28 IMAGING — CR DG KNEE COMPLETE 4+V*R*
4 series · 4 of 4 positions shown · non-contrast
Comparison: None.

CLINICAL DATA: Progressive right knee for 2 days.  No known injury.

EXAM:
RIGHT KNEE - COMPLETE 4+ VIEW

[t knee ap right]
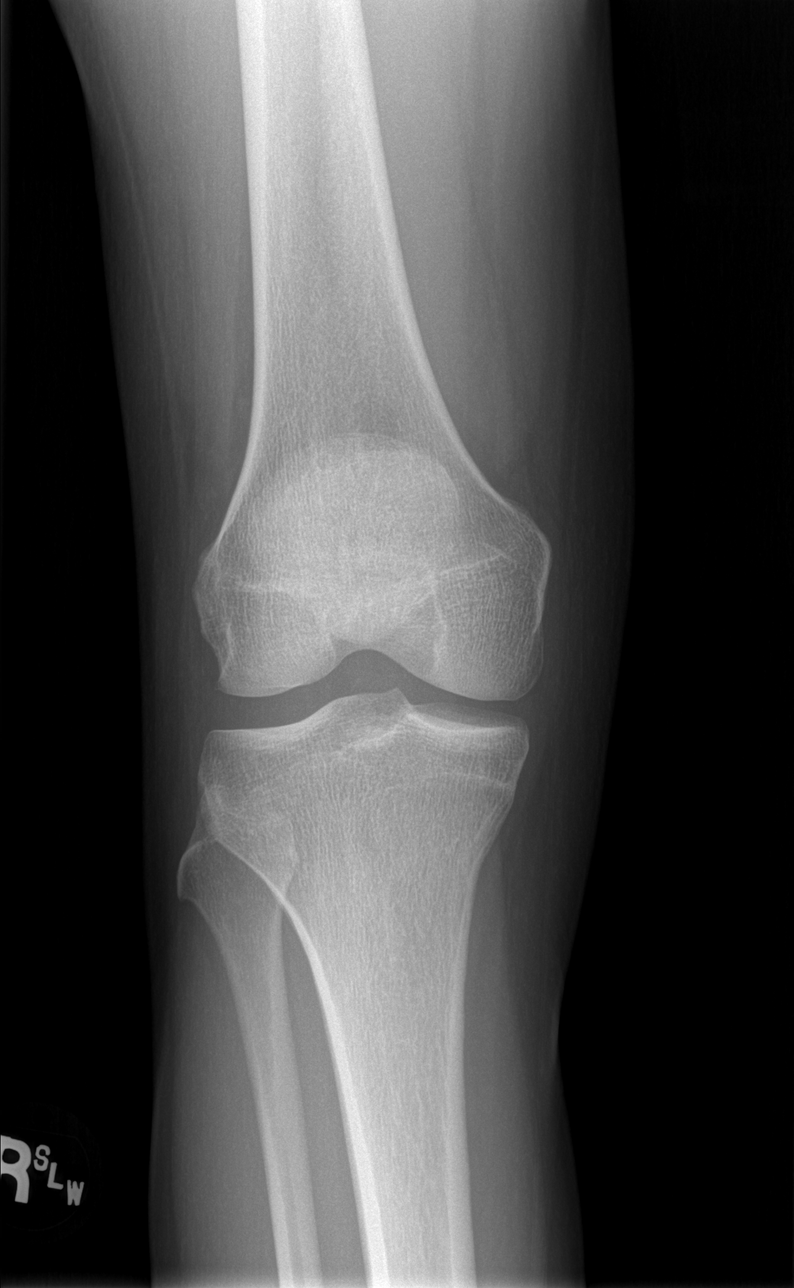

[t knee oblique right (1 of 2)]
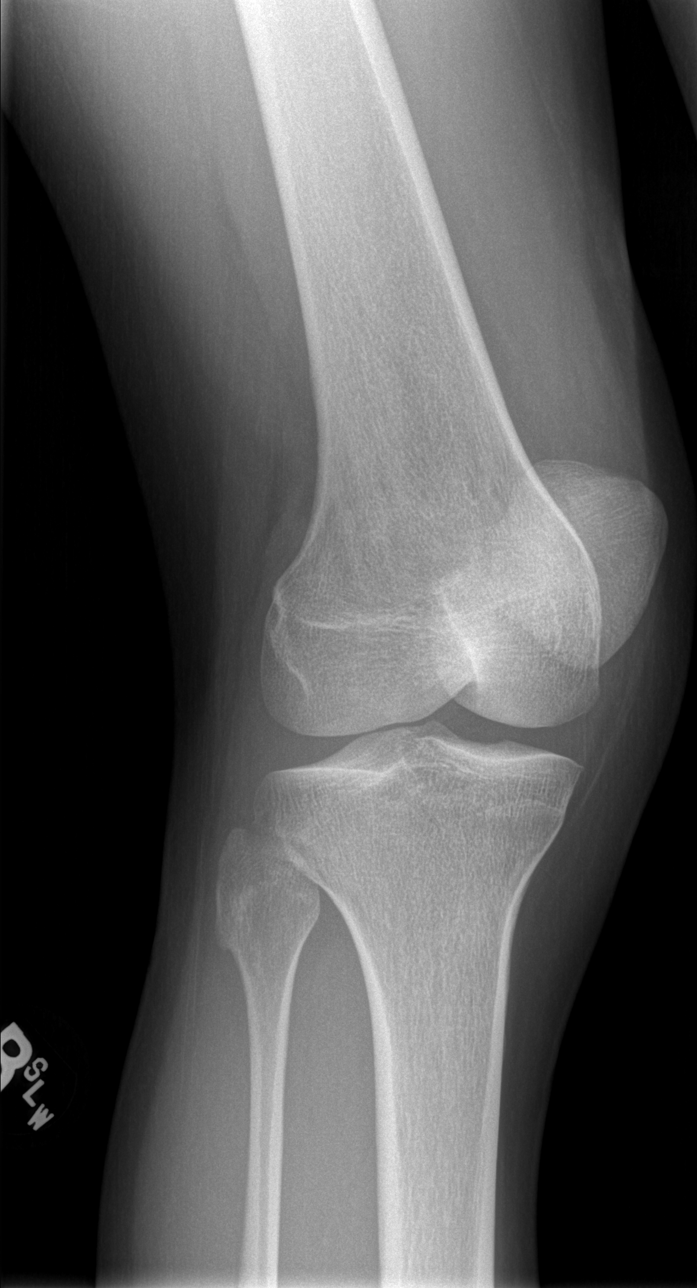

[t knee oblique right (2 of 2)]
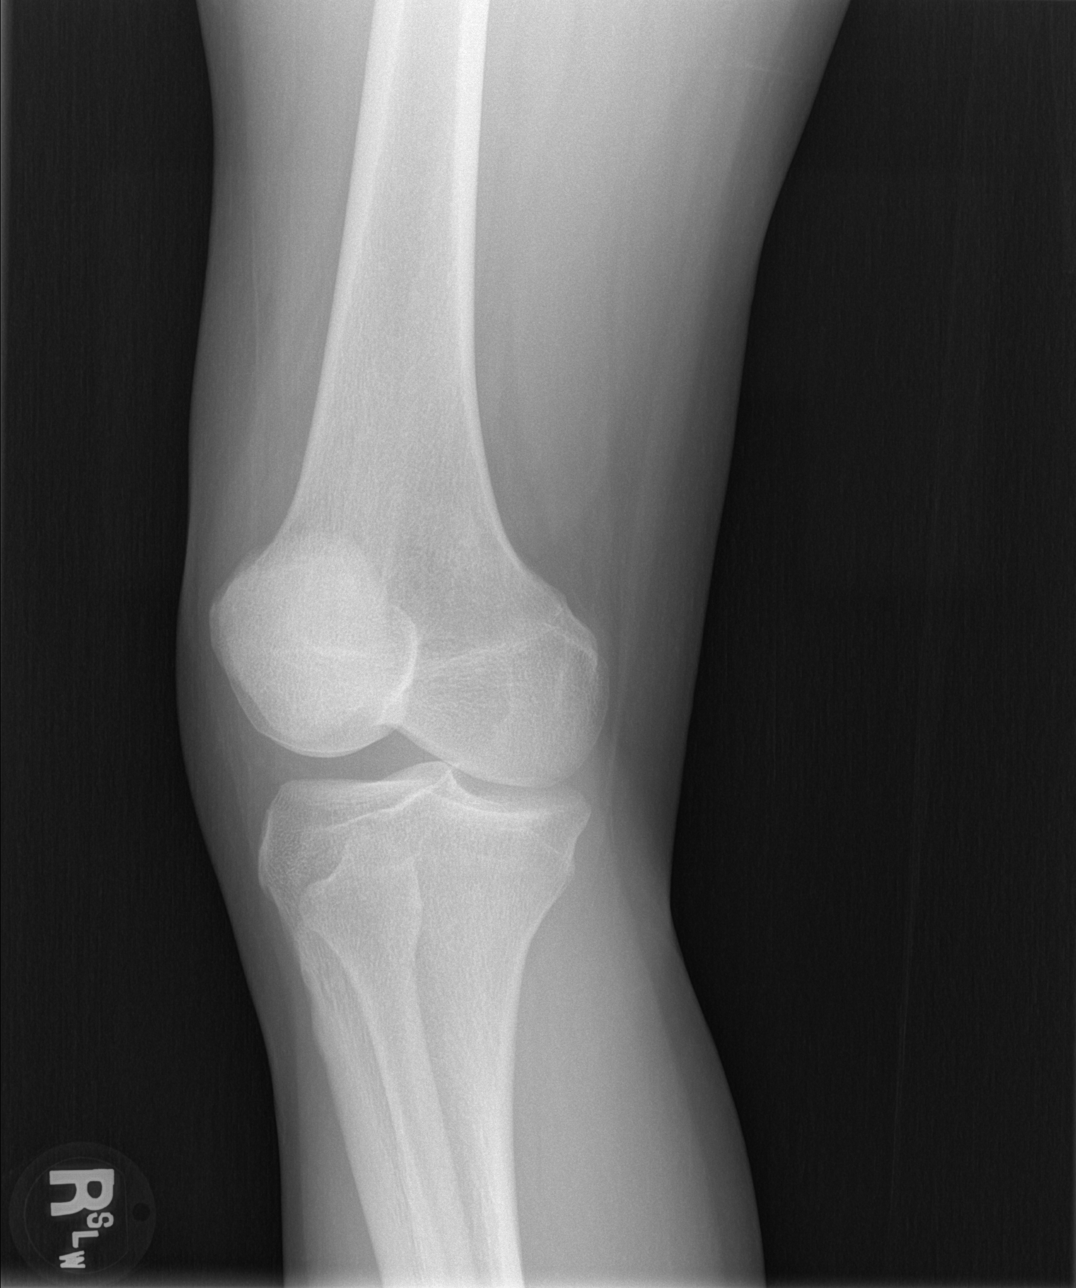

[t knee lat right]
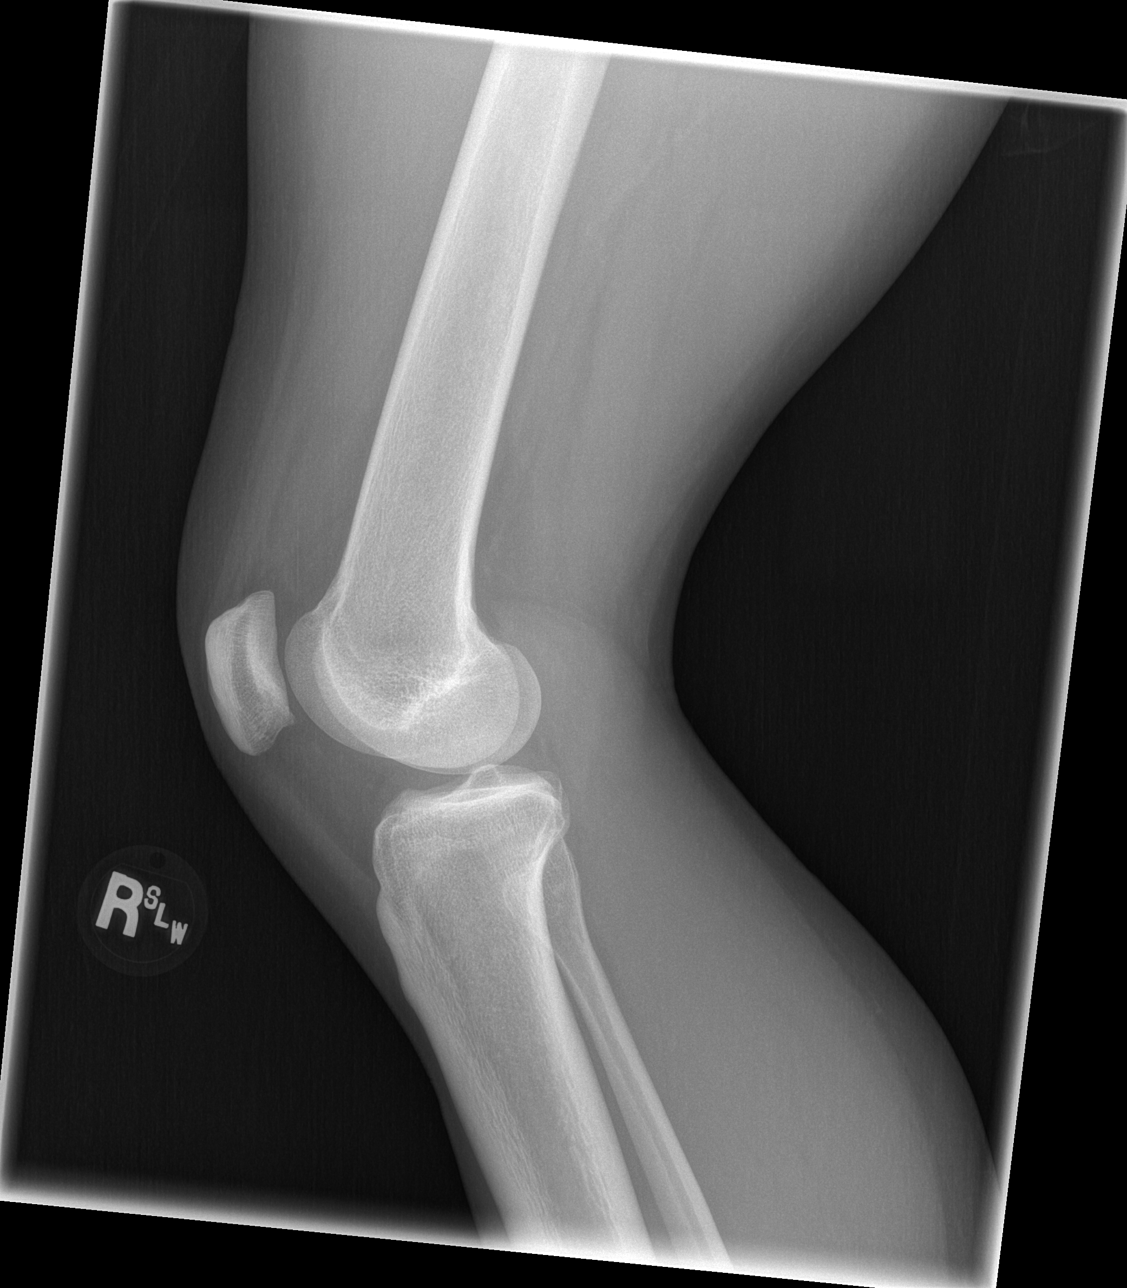

[4 of 4 positions shown; findings below may reference images not displayed]

FINDINGS: No evidence of fracture, dislocation, or joint effusion. No evidence
of arthropathy or other focal bone abnormality. Soft tissues are
unremarkable.
IMPRESSION: Negative.

## 2019-05-05 ENCOUNTER — Other Ambulatory Visit: Payer: Self-pay

## 2019-05-05 ENCOUNTER — Emergency Department (HOSPITAL_COMMUNITY)
Admission: EM | Admit: 2019-05-05 | Discharge: 2019-05-05 | Disposition: A | Discharge status: Home / Self Care | Payer: Self-pay | Attending: Emergency Medicine | Admitting: Emergency Medicine

## 2019-05-05 DIAGNOSIS — F17218 Nicotine dependence, cigarettes, with other nicotine-induced disorders: Secondary | ICD-10-CM | POA: Insufficient documentation

## 2019-05-05 DIAGNOSIS — R079 Chest pain, unspecified: Secondary | ICD-10-CM

## 2019-05-05 DIAGNOSIS — R0789 Other chest pain: Secondary | ICD-10-CM | POA: Insufficient documentation

## 2019-05-05 DIAGNOSIS — Z79899 Other long term (current) drug therapy: Secondary | ICD-10-CM | POA: Insufficient documentation

## 2019-05-05 DIAGNOSIS — R569 Unspecified convulsions: Secondary | ICD-10-CM | POA: Insufficient documentation

## 2019-05-05 DIAGNOSIS — J45909 Unspecified asthma, uncomplicated: Secondary | ICD-10-CM | POA: Insufficient documentation

## 2019-05-05 LAB — I-STAT BETA HCG BLOOD, ED (MC, WL, AP ONLY): I-stat hCG, quantitative: <5 m[IU]/mL (ref <5)

## 2019-05-05 LAB — CBC WITH DIFFERENTIAL/PLATELET
Abs Immature Granulocytes: 0.01 10*3/uL (ref 0.00–0.07)
Basophils Absolute: 0 10*3/uL (ref 0.0–0.1)
Basophils Relative: 1 %
Eosinophils Absolute: 0.2 10*3/uL (ref 0.0–0.5)
Eosinophils Relative: 4 %
HCT: 38.7 % (ref 36.0–46.0)
Hemoglobin: 12.6 g/dL (ref 12.0–15.0)
Immature Granulocytes: 0 %
Lymphocytes Relative: 37 %
Lymphs Abs: 1.9 10*3/uL (ref 0.7–4.0)
MCH: 32.2 pg (ref 26.0–34.0)
MCHC: 32.6 g/dL (ref 30.0–36.0)
MCV: 99 fL (ref 80.0–100.0)
Monocytes Absolute: 0.4 10*3/uL (ref 0.1–1.0)
Monocytes Relative: 8 %
Neutro Abs: 2.5 10*3/uL (ref 1.7–7.7)
Neutrophils Relative %: 50 %
Platelets: 263 10*3/uL (ref 150–400)
RBC: 3.91 MIL/uL (ref 3.87–5.11)
RDW: 13 % (ref 11.5–15.5)
WBC: 5.1 10*3/uL (ref 4.0–10.5)
nRBC: 0 % (ref 0.0–0.2)

## 2019-05-05 LAB — COMPREHENSIVE METABOLIC PANEL
ALT: 13 U/L (ref 0–44)
AST: 17 U/L (ref 15–41)
Albumin: 3.7 g/dL (ref 3.5–5.0)
Alkaline Phosphatase: 38 U/L (ref 38–126)
Anion gap: 7 (ref 5–15)
BUN: 6 mg/dL (ref 6–20)
CO2: 21 mmol/L — ABNORMAL LOW (ref 22–32)
Calcium: 8.6 mg/dL — ABNORMAL LOW (ref 8.9–10.3)
Chloride: 110 mmol/L (ref 98–111)
Creatinine, Ser: 0.74 mg/dL (ref 0.44–1.00)
GFR calc Af Amer: >60 mL/min (ref >60)
GFR calc non Af Amer: >60 mL/min (ref >60)
Glucose, Bld: 98 mg/dL (ref 70–99)
Potassium: 3.7 mmol/L (ref 3.5–5.1)
Sodium: 138 mmol/L (ref 135–145)
Total Bilirubin: 0.5 mg/dL (ref 0.3–1.2)
Total Protein: 6.3 g/dL — ABNORMAL LOW (ref 6.5–8.1)

## 2019-05-05 LAB — TROPONIN I (HIGH SENSITIVITY)
Troponin I (High Sensitivity): 7 ng/L (ref <18)
Troponin I (High Sensitivity): <2 ng/L (ref <18)

## 2019-05-05 LAB — CBG MONITORING, ED: Glucose-Capillary: 99 mg/dL (ref 70–99)

## 2019-05-05 LAB — VALPROIC ACID LEVEL: Valproic Acid Lvl: <10 ug/mL — ABNORMAL LOW (ref 50.0–100.0)

## 2019-05-05 MED ORDER — LEVETIRACETAM IN NACL 1000 MG/100ML IV SOLN
1000.0000 mg | Freq: Once | INTRAVENOUS | Status: AC
  Administered 2019-05-05: 12:00:00 1000 mg via INTRAVENOUS
  Filled 2019-05-05: qty 100

## 2019-05-05 NOTE — ED Notes (Signed)
Assisted pt to restroom with no issues.back to room placed back on monitor.

## 2019-05-05 NOTE — Discharge Instructions (Addendum)
Today you were seen after a seizure. We gave you a dose of keppra to try and prevent another episode. We have provided you with a number to call to schedule an appointment with a primary care provider and a neurologist. Your neurologist may decide to adjust your anti-epileptic medications. The primary care provider may also be able to help in giving you medications to help you sleep. We think that your lack of sleep may have caused you to have a seizure. It is important that you try to get enough sleep at night. We have attached some information on insomnia with tips to help improve sleep.  Please return to the emergency room if you have another seizure, severe chest pain, shortness of breath, fever, or any other concerning symptom.  Thank you for allowing [place] to be part of your medical care

## 2019-05-05 NOTE — ED Provider Notes (Signed)
[facility] EMERGENCY DEPARTMENT Provider Note   CSN: [id number] Arrival date & time: 05/05/19  6734     History   Chief Complaint Chief Complaint  Patient presents with  . Seizures    HPI Tina [name] is a 38 y.o. female with past medical history of asthma and epilepsy who presents with seizure.  Patient reports that she had a seizure event around 6 or 7 AM and thinks that she had a prior event earlier in the night.  Per niece she was sleeping in the other room when she heard a noise and came to see the patient full body shaking in bed at around 7 AM.  After event, niece reports patient was confused.  Niece not sure of duration of seizure or duration of postictal state.  Patient reports her typical aura occurred where she felt warm and started sweating.  Patient states that she often loses urine or bowel control during seizures and with event this morning lost urinary control.  Patient reports the last event was a couple weeks ago after which he was hospitalized at [facility].  After seizure patient has shortness of breath which patient says is normal however she has chest pain located in her mid chest that started this morning and has been constant, nonexertional, worse with deep breathing.  This is a typical symptom after seizures.  Patient says she was first diagnosed with epilepsy when she was 38 years old.  Patient reports taking Keppra 1500 mg twice daily and Trileptal 600 mg twice daily.  Patient states that about a month ago she decreased the amount of time she was taking her medications to try to ration out her supply.  After being hospitalized at [facility] about 2 weeks ago she got a new prescription and has been taking her doses consistently.  She does not take Depakote or Vimpat.  Patient reports only other medication she takes is an inhaler for asthma and some allergy medications.   Patient denies recreational drug use other than marijuana. Had  one beer prior day at 2pm. Patient does endorse chronic insomnia which only allows her to get a few hours sleep per night. Patient used to take ambien for insomnia but does not currently take anything.     HPI  Past Medical History:  Diagnosis Date  . Anxiety   . Asthma   . Depression   . Epilepsy ([place])   . Fibroid tumor   . Migraine   . Seizures ([name])    last seizure 05/2016  . Seizures ([place])   . Stomach ulcer   . 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 Alcohol abuse   . Seizure ([place]) 03/15/2016  . Alcohol use 03/15/2016  . Hypokalemia 03/15/2016  . Prolonged Q-T interval on ECG   . Somnolence   . 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 Asthma 02/19/2016  . 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 Non compliance with medical treatment 12/07/2015  . Gastritis 11/08/2015  . Allergic rhinitis 11/08/2015  . Epilepsy ([place])   . Major depressive disorder, recurrent ([place]) 09/10/2015  . Suicidal ideations   . Suicide attempt [facility])     Past Surgical History:  Procedure Laterality Date  . UMBILICAL HERNIA REPAIR  12/2014     OB History    Gravida  0   Para  0   Term  0  Preterm  0   AB  0   Living        SAB  0   TAB  0   Ectopic  0   Multiple      Live Births               Home Medications    Prior to Admission medications   Medication Sig Start Date End Date Taking? Authorizing Provider  albuterol (PROVENTIL HFA;VENTOLIN HFA) 108 (90 Base) MCG/ACT inhaler Inhale 2 puffs into the lungs every 4 (four) hours as needed for wheezing or shortness of breath. 02/21/16   Charlott Rakes, MD  bismuth subsalicylate (PEPTO-BISMOL) 262 MG/15ML suspension Take 30 mLs by mouth every 6 (six) hours as needed. Patient not taking: Reported on 09/03/2016 02/19/16   Charlott Rakes, MD  cetirizine (ZYRTEC) 10 MG tablet Take 1 tablet (10 mg total) by mouth daily as needed for allergies.  Patient taking differently: Take 10 mg by mouth 2 (two) times daily.  02/19/16   Charlott Rakes, MD  divalproex (DEPAKOTE ER) 500 MG 24 hr tablet Take 1 tablet (500 mg total) by mouth 2 (two) times daily. Patient not taking: Reported on 09/03/2016 07/17/16   Melvenia Beam, MD  fluticasone furoate-vilanterol (BREO ELLIPTA) 200-25 MCG/INH AEPB Inhale 1 puff into the lungs daily after breakfast. For Asthma 02/21/16   Charlott Rakes, MD  hydrOXYzine (ATARAX/VISTARIL) 25 MG tablet Take 1 tablet (25 mg total) by mouth every 6 (six) hours as needed for anxiety. Patient not taking: Reported on 09/03/2016 09/12/15   Lindell Spar I, NP  ibuprofen (ADVIL,MOTRIN) 600 MG tablet Take 1 tablet (600 mg total) by mouth every 6 (six) hours as needed. Patient not taking: Reported on 09/03/2016 06/11/16   Nona Dell, PA-C  Lacosamide (VIMPAT) 150 MG TABS Take 1 tablet (150 mg total) by mouth 2 (two) times daily. 09/03/16 10/03/16  Deno Etienne, DO  levETIRAcetam (KEPPRA XR) 500 MG 24 hr tablet Take 3 tablets (1,500 mg total) by mouth 2 (two) times daily. 09/03/16 10/03/16  Deno Etienne, DO  Oxcarbazepine (TRILEPTAL) 300 MG tablet 2 pills (600mg ) by mouth 2 (two) times daily. 08/19/16   Melvenia Beam, MD  pantoprazole (PROTONIX) 20 MG tablet Take 1 tablet (20 mg total) by mouth daily. 02/19/16   Charlott Rakes, MD    Family History Family History  Problem Relation Age of Onset  . Heart failure Mother   . Cancer Father   . Seizures Father   . Drug abuse Sister   . Alcohol abuse Sister   . Alcohol abuse Sister   . Drug abuse Sister     Social History Social History   Tobacco Use  . Smoking status: Current Some Day Smoker    Packs/day: 0.25    Years: 0.00    Pack years: 0.00    Types: Cigars  . Smokeless tobacco: Never Used  . Tobacco comment: BLACK MILD  Substance Use Topics  . Alcohol use: Yes    Alcohol/week: 1.0 standard drinks    Types: 1 Cans of beer per week    Comment: occ  . Drug  use: No     Allergies   Bee venom, Chocolate, Mosquito (diagnostic), Orange fruit [citrus], Oregano [origanum oil], Penicillins, Dilantin [phenytoin sodium extended], Garlic, Tramadol, Pork-derived products, Latex, Poractant alfa, and Tape   Review of Systems Review of Systems  Constitutional: Negative for chills and fever.  Respiratory: Positive for shortness of breath. Negative for cough.  Cardiovascular: Positive for chest pain.  Gastrointestinal: Negative for abdominal pain, diarrhea, nausea and vomiting.  Genitourinary: Negative for dysuria.  All other systems reviewed and are negative.    Physical Exam Updated Vital Signs BP 128/86   Pulse 63   Temp 98.1 F (36.7 C) (Oral)   Resp 18   Ht 5\' 4"  (1.626 m)   Wt 65.8 kg   LMP 04/10/2019   SpO2 100%   BMI 24.89 kg/m   Physical Exam Constitutional:      Appearance: She is well-developed.  HENT:     Head: Normocephalic and atraumatic.  Eyes:     Extraocular Movements: Extraocular movements intact.  Cardiovascular:     Rate and Rhythm: Normal rate and regular rhythm.     Heart sounds: No murmur. No friction rub. No gallop.   Pulmonary:     Breath sounds: Normal breath sounds. No wheezing, rhonchi or rales.  Abdominal:     General: Abdomen is flat. There is no distension.     Palpations: Abdomen is soft. There is no mass.     Tenderness: There is no abdominal tenderness.  Musculoskeletal:        General: No swelling or tenderness.  Skin:    General: Skin is warm and dry.  Neurological:     General: No focal deficit present.     Mental Status: She is alert.     Cranial Nerves: No cranial nerve deficit.     Motor: Weakness (Mild left sided weakness of upper extremity from prior stroke, baseline per patient) present.  Psychiatric:        Mood and Affect: Mood normal.        Behavior: Behavior normal.      ED Treatments / Results  Labs (all labs ordered are listed, but only abnormal results are displayed)  Labs Reviewed  COMPREHENSIVE METABOLIC PANEL - Abnormal; Notable for the following components:      Result Value   CO2 21 (*)    Calcium 8.6 (*)    Total Protein 6.3 (*)    All other components within normal limits  VALPROIC ACID LEVEL - Abnormal; Notable for the following components:   Valproic Acid Lvl <10 (*)    All other components within normal limits  CBC WITH DIFFERENTIAL/PLATELET  LEVETIRACETAM LEVEL  LACOSAMIDE  10-HYDROXYCARBAZEPINE  CBG MONITORING, ED  I-STAT BETA HCG BLOOD, ED ([facility], [facility], [facility] ONLY)  TROPONIN I (HIGH SENSITIVITY)  TROPONIN I (HIGH SENSITIVITY)    EKG EKG Interpretation  Date/Time:  Thursday May 05 2019 07:23:03 EDT Ventricular Rate:  72 PR Interval:  164 QRS Duration: 82 QT Interval:  390 QTC Calculation: 427 R Axis:   66 Text Interpretation:  Normal sinus rhythm Nonspecific T wave abnormality Abnormal ECG Artifact No significant change since last tracing Confirmed by Gareth Morgan [phone]) on 05/05/2019 10:38:14 AM   Radiology No results found.  Procedures Procedures (including critical care time)  Medications Ordered in ED Medications  levETIRAcetam (KEPPRA) IVPB 1000 mg/100 mL premix (0 mg Intravenous Stopped 05/05/19 1228)     Initial Impression / Assessment and Plan / ED Course  I have reviewed the triage vital signs and the nursing notes.  Pertinent labs & imaging results that were available during my care of the patient were reviewed by me and considered in my medical decision making (see chart for details).        Patient is a 38 year old female with past medical history of asthma and epilepsy who  presents after what appears to be an epileptic seizure.  Patient denies medication non-compliance states she has been stable on her medications with past 2 weeks, but has had sleep deprivation with her chronic insomnia. Keppra and Depakote levels pending.  Discussed case with neurology who advised giving her 1000 mg Keppra and  discharging her with neurology follow-up.  CBC and CMP without significant abnormalities.  For patient's chest pain, EKG without signs of acute ischemia and serial troponins were decreasing with values of 7 and 2.  Given clinical symptoms and history, patient very low risk for PE.  Patient provided with contact information for primary care and neurology follow-up.  Case management contacted and requested to reach out to patient to provide information on neurology care for uninsured patients.  Final Clinical Impressions(s) / ED Diagnoses   Final diagnoses:  Seizure [facility])    ED Discharge Orders    None       Jeanmarie Hubert, MD 05/05/19 1556    Gareth Morgan, MD 05/07/19 [phone]

## 2019-05-05 NOTE — ED Notes (Signed)
Tried to get patient blood in left AC. I didn't have any success. The Nurse was informed.

## 2019-05-05 NOTE — ED Triage Notes (Signed)
Pt reports seizure overnight. Reports urinary continence with seizure. No injury to tongue. Pt alert, oriented x4, VSS. Pt reports good compliance with seizure medications keppra and trileptal.

## 2019-05-06 LAB — LEVETIRACETAM LEVEL: Levetiracetam Lvl: 53.5 ug/mL — ABNORMAL HIGH (ref 10.0–40.0)

## 2019-05-06 LAB — 10-HYDROXYCARBAZEPINE: Triliptal/MTB(Oxcarbazepin): 24 ug/mL (ref 10–35)

## 2019-05-07 LAB — LACOSAMIDE: Lacosamide: <0.5 ug/mL — ABNORMAL LOW (ref 5.0–10.0)

## 2019-07-20 ENCOUNTER — Encounter: Payer: Self-pay | Admitting: Neurology

## 2019-08-28 ENCOUNTER — Encounter (HOSPITAL_COMMUNITY): Payer: Self-pay | Admitting: Emergency Medicine

## 2019-08-28 ENCOUNTER — Emergency Department (HOSPITAL_COMMUNITY)
Admission: EM | Admit: 2019-08-28 | Discharge: 2019-08-28 | Disposition: A | Discharge status: Home / Self Care | Payer: Self-pay | Attending: Emergency Medicine | Admitting: Emergency Medicine

## 2019-08-28 ENCOUNTER — Other Ambulatory Visit: Payer: Self-pay

## 2019-08-28 ENCOUNTER — Emergency Department (HOSPITAL_COMMUNITY): Payer: Self-pay

## 2019-08-28 DIAGNOSIS — Z79899 Other long term (current) drug therapy: Secondary | ICD-10-CM | POA: Insufficient documentation

## 2019-08-28 DIAGNOSIS — Z91040 Latex allergy status: Secondary | ICD-10-CM | POA: Insufficient documentation

## 2019-08-28 DIAGNOSIS — Z720 Tobacco use: Secondary | ICD-10-CM | POA: Insufficient documentation

## 2019-08-28 DIAGNOSIS — M25462 Effusion, left knee: Secondary | ICD-10-CM | POA: Insufficient documentation

## 2019-08-28 DIAGNOSIS — M25562 Pain in left knee: Secondary | ICD-10-CM | POA: Insufficient documentation

## 2019-08-28 DIAGNOSIS — F1290 Cannabis use, unspecified, uncomplicated: Secondary | ICD-10-CM | POA: Insufficient documentation

## 2019-08-28 MED ORDER — IBUPROFEN 600 MG PO TABS: 600.0000 mg | ORAL_TABLET | Freq: Four times a day (QID) | ORAL | 0 refills | Status: DC | PRN

## 2019-08-28 MED ORDER — KETOROLAC TROMETHAMINE 30 MG/ML IJ SOLN
30.0000 mg | Freq: Once | INTRAMUSCULAR | Status: AC
  Administered 2019-08-28: 30 mg via INTRAMUSCULAR
  Filled 2019-08-28: qty 1

## 2019-08-28 NOTE — ED Provider Notes (Signed)
[facility] EMERGENCY DEPARTMENT Provider Note   CSN: [id number] Arrival date & time: 08/28/19  1054     History Chief Complaint  Patient presents with  . Knee Pain    Tina [name] is a 38 y.o. female.  The history is provided by the patient. No language interpreter was used.  Knee Pain Associated symptoms: no fever         38 year old female with history of seizure, remote right knee dislocation, presenting complaining of left knee pain.  Patient reports she was awoke this morning with pain to her left knee.  She described pain as acute, sharp, 10 out of 10 to her knee and states that she felt her knee was buckle inward.  She is try to straighten out her leg and went propping over the other leg, the pain did improve to 6 out of 10.  Pain does not radiate, no ankle or hip pain.  No recent injury.  No fever or chills and no recent trauma.  She does report having history of seizure and sometimes may have seizure overnight that she does not recall.  She denies any tongue biting, bowel bladder incontinence.  Past Medical History:  Diagnosis Date  . Anxiety   . Asthma   . Depression   . Epilepsy ([place])   . Fibroid tumor   . Migraine   . Seizures ([place])    last seizure 05/2016  . Seizures ([place])   . Stomach ulcer   . Stroke [facility])    reports residual left side weakness    Patient Active Problem List   Diagnosis Date Noted  . Alcohol abuse   . Seizure ([place]) 03/15/2016  . Alcohol use 03/15/2016  . Hypokalemia 03/15/2016  . Prolonged Q-T interval on ECG   . Somnolence   . 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 Asthma 02/19/2016  . 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 Non compliance with medical treatment 12/07/2015  . Gastritis 11/08/2015  . Allergic rhinitis 11/08/2015  . Epilepsy ([place])   . Major depressive disorder, recurrent ([place]) 09/10/2015  . Suicidal ideations   . Suicide attempt [facility])     Past Surgical History:    Procedure Laterality Date  . UMBILICAL HERNIA REPAIR  12/2014     OB History    Gravida  0   Para  0   Term  0   Preterm  0   AB  0   Living        SAB  0   TAB  0   Ectopic  0   Multiple      Live Births              Family History  Problem Relation Age of Onset  . Heart failure Mother   . Cancer Father   . Seizures Father   . Drug abuse Sister   . Alcohol abuse Sister   . Alcohol abuse Sister   . Drug abuse Sister     Social History   Tobacco Use  . Smoking status: Current Some Day Smoker    Packs/day: 0.25    Years: 0.00    Pack years: 0.00    Types: Cigars  . Smokeless tobacco: Never Used  . Tobacco comment: BLACK MILD  Substance Use Topics  . Alcohol use: Yes    Alcohol/week: 1.0 standard drinks    Types: 1 Cans of beer per week    Comment: occ  . Drug use: Yes  Types: Marijuana    Home Medications Prior to Admission medications   Medication Sig Start Date End Date Taking? Authorizing Provider  albuterol (PROVENTIL HFA;VENTOLIN HFA) 108 (90 Base) MCG/ACT inhaler Inhale 2 puffs into the lungs every 4 (four) hours as needed for wheezing or shortness of breath. 02/21/16   Charlott Rakes, MD  bismuth subsalicylate (PEPTO-BISMOL) 262 MG/15ML suspension Take 30 mLs by mouth every 6 (six) hours as needed. Patient not taking: Reported on 09/03/2016 02/19/16   Charlott Rakes, MD  cetirizine (ZYRTEC) 10 MG tablet Take 1 tablet (10 mg total) by mouth daily as needed for allergies. Patient taking differently: Take 10 mg by mouth 2 (two) times daily.  02/19/16   Charlott Rakes, MD  divalproex (DEPAKOTE ER) 500 MG 24 hr tablet Take 1 tablet (500 mg total) by mouth 2 (two) times daily. Patient not taking: Reported on 09/03/2016 07/17/16   Melvenia Beam, MD  fluticasone furoate-vilanterol (BREO ELLIPTA) 200-25 MCG/INH AEPB Inhale 1 puff into the lungs daily after breakfast. For Asthma 02/21/16   Charlott Rakes, MD  hydrOXYzine (ATARAX/VISTARIL) 25 MG  tablet Take 1 tablet (25 mg total) by mouth every 6 (six) hours as needed for anxiety. Patient not taking: Reported on 09/03/2016 09/12/15   Lindell Spar I, NP  ibuprofen (ADVIL,MOTRIN) 600 MG tablet Take 1 tablet (600 mg total) by mouth every 6 (six) hours as needed. Patient not taking: Reported on 09/03/2016 06/11/16   Nona Dell, PA-C  Lacosamide (VIMPAT) 150 MG TABS Take 1 tablet (150 mg total) by mouth 2 (two) times daily. 09/03/16 10/03/16  Deno Etienne, DO  levETIRAcetam (KEPPRA XR) 500 MG 24 hr tablet Take 3 tablets (1,500 mg total) by mouth 2 (two) times daily. 09/03/16 10/03/16  Deno Etienne, DO  Oxcarbazepine (TRILEPTAL) 300 MG tablet 2 pills (600mg ) by mouth 2 (two) times daily. 08/19/16   Melvenia Beam, MD  pantoprazole (PROTONIX) 20 MG tablet Take 1 tablet (20 mg total) by mouth daily. 02/19/16   Charlott Rakes, MD    Allergies    Bee venom, Chocolate, Mosquito (diagnostic), Orange fruit [citrus], Oregano [origanum oil], Penicillins, Dilantin [phenytoin sodium extended], Garlic, Tramadol, Pork-derived products, Latex, Poractant alfa, and Tape  Review of Systems   Review of Systems  Constitutional: Negative for fever.  Musculoskeletal: Positive for arthralgias.  Neurological: Negative for numbness.    Physical Exam Updated Vital Signs BP 125/82 (BP Location: Left Arm)   Pulse 91   Temp 98.5 F (36.9 C) (Oral)   Resp 12   Ht 5\' 4"  (1.626 m)   Wt 63.5 kg   LMP 08/14/2019   SpO2 100%   BMI 24.03 kg/m   Physical Exam Vitals and nursing note reviewed.  Constitutional:      General: She is not in acute distress.    Appearance: She is well-developed.  HENT:     Head: Atraumatic.  Eyes:     Conjunctiva/sclera: Conjunctivae normal.  Musculoskeletal:        General: Tenderness (Left knee: Effusion noted to anterior knee with tenderness to palpation of patella however patella is located.  No joint laxity, able to flex and extend with difficulty.  No erythema or  warmth noted.) present.     Cervical back: Neck supple.     Comments: Left ankle and left hip nontender to palpation.  Skin:    Findings: No rash.  Neurological:     Mental Status: She is alert.     ED Results / Procedures /  Treatments   Labs (all labs ordered are listed, but only abnormal results are displayed) Labs Reviewed - No data to display  EKG None  Radiology DG Knee Complete 4 Views Left  Result Date: 08/28/2019 CLINICAL DATA:  Pt to ED for L knee pain x 4am today, no known injury. Pt c/o medical side pain. Pt hx seizures. L knee pain EXAM: LEFT KNEE - COMPLETE 4+ VIEW COMPARISON:  None. FINDINGS: No fracture of the proximal tibia or distal femur. Patella is normal. No joint effusion. IMPRESSION: No fracture or dislocation. Electronically Signed   By: Suzy Bouchard M.D.   On: 08/28/2019 11:55    Procedures Procedures (including critical care time)  Medications Ordered in ED Medications - No data to display  ED Course  I have reviewed the triage vital signs and the nursing notes.  Pertinent labs & imaging results that were available during my care of the patient were reviewed by me and considered in my medical decision making (see chart for details).    MDM Rules/Calculators/A&P     CHA2DS2/VAS Stroke Risk Points      N/A >= 2 Points: High Risk  1 - 1.99 Points: Medium Risk  0 Points: Low Risk    A final score could not be computed because of missing components.: Last  Change: N/A     This score determines the patient's risk of having a stroke if the  patient has atrial fibrillation.      This score is not applicable to this patient. Components are not  calculated.                   BP (!) 120/93   Pulse 85   Temp 98.5 F (36.9 C) (Oral)   Resp 15   Ht 5\' 4"  (1.626 m)   Wt 63.5 kg   LMP 08/14/2019   SpO2 99%   BMI 24.03 kg/m   Final Clinical Impression(s) / ED Diagnoses Final diagnoses:  Acute pain of left knee    Rx / DC Orders ED  Discharge Orders         Ordered    ibuprofen (ADVIL) 600 MG tablet  Every 6 hours PRN     08/28/19 1225         Patient presents with left knee pain.  This started at nighttime, she history of seizure therefore patient on aware this is related to a possible seizure episode overnight that can cause her to have left knee pain.  On exam she does have some mild effusion noted to the anterior knee with discomfort on palpation but no obvious deformity.  X-ray of her left knee without any evidence of fracture or dislocation and no obvious joint effusion on x-ray.  Will provide knee sleeves and crutches for support, rice therapy discussed, orthopedic referral given as needed.  Return precaution discussed.  I suspect patient may have a patella dislocation and subsequent relocation.   Domenic Moras, PA-C 08/28/19 1226    Isla Pence, MD 08/28/19 [phone]

## 2019-08-28 NOTE — ED Notes (Signed)
Patient transported to X-ray 

## 2019-08-28 NOTE — ED Notes (Signed)
Pt d/c home per MD order. Discharge summary reviewed with pt, pt verbalizes understanding. Knee sleeve applied to left knee, crutches provided. Reports discharge ride home. No s/s of acute distress noted.

## 2019-08-28 NOTE — ED Triage Notes (Signed)
Woke up with L knee pain this morning.   Denies known injury.

## 2019-09-03 ENCOUNTER — Encounter (HOSPITAL_COMMUNITY): Payer: Self-pay

## 2019-09-03 ENCOUNTER — Emergency Department (HOSPITAL_COMMUNITY)
Admission: EM | Admit: 2019-09-03 | Discharge: 2019-09-04 | Disposition: A | Discharge status: Home / Self Care | Payer: Medicaid Other | Attending: Emergency Medicine | Admitting: Emergency Medicine

## 2019-09-03 ENCOUNTER — Other Ambulatory Visit: Payer: Self-pay

## 2019-09-03 DIAGNOSIS — Z8673 Personal history of transient ischemic attack (TIA), and cerebral infarction without residual deficits: Secondary | ICD-10-CM | POA: Insufficient documentation

## 2019-09-03 DIAGNOSIS — Z91040 Latex allergy status: Secondary | ICD-10-CM | POA: Insufficient documentation

## 2019-09-03 DIAGNOSIS — R569 Unspecified convulsions: Secondary | ICD-10-CM | POA: Insufficient documentation

## 2019-09-03 DIAGNOSIS — J45909 Unspecified asthma, uncomplicated: Secondary | ICD-10-CM | POA: Insufficient documentation

## 2019-09-03 DIAGNOSIS — Z79899 Other long term (current) drug therapy: Secondary | ICD-10-CM | POA: Insufficient documentation

## 2019-09-03 DIAGNOSIS — F17290 Nicotine dependence, other tobacco product, uncomplicated: Secondary | ICD-10-CM | POA: Insufficient documentation

## 2019-09-03 LAB — CBG MONITORING, ED: Glucose-Capillary: 84 mg/dL (ref 70–99)

## 2019-09-03 MED ORDER — LEVETIRACETAM IN NACL 1000 MG/100ML IV SOLN
1000.0000 mg | Freq: Once | INTRAVENOUS | Status: AC
  Administered 2019-09-04: 1000 mg via INTRAVENOUS
  Filled 2019-09-03: qty 100

## 2019-09-03 NOTE — ED Triage Notes (Signed)
BIB EMS from home. Family reports 3  consecutive 7 minutes seizures with no regain of consciousness between Pt reports posturing.  And Pt reports more seizures in the past week. With multiple seizures today.  Pt missed dose of Kepra scheduled at 5p otherwise compliant with medications.  Pt A/O x4.    134/90 90 18 97% RA Cbg 93

## 2019-09-04 LAB — I-STAT BETA HCG BLOOD, ED (MC, WL, AP ONLY): I-stat hCG, quantitative: <5 m[IU]/mL (ref <5)

## 2019-09-04 LAB — URINALYSIS, ROUTINE W REFLEX MICROSCOPIC
Bilirubin Urine: NEGATIVE
Glucose, UA: NEGATIVE mg/dL
Hgb urine dipstick: NEGATIVE
Ketones, ur: 5 mg/dL — AB
Leukocytes,Ua: NEGATIVE
Nitrite: NEGATIVE
Protein, ur: 30 mg/dL — AB
Specific Gravity, Urine: 1.032 — ABNORMAL HIGH (ref 1.005–1.030)
pH: 5 (ref 5.0–8.0)

## 2019-09-04 LAB — CBC WITH DIFFERENTIAL/PLATELET
Abs Immature Granulocytes: 0.01 10*3/uL (ref 0.00–0.07)
Basophils Absolute: 0 10*3/uL (ref 0.0–0.1)
Basophils Relative: 1 %
Eosinophils Absolute: 0.2 10*3/uL (ref 0.0–0.5)
Eosinophils Relative: 2 %
HCT: 40.8 % (ref 36.0–46.0)
Hemoglobin: 13.2 g/dL (ref 12.0–15.0)
Immature Granulocytes: 0 %
Lymphocytes Relative: 38 %
Lymphs Abs: 2.9 10*3/uL (ref 0.7–4.0)
MCH: 32.2 pg (ref 26.0–34.0)
MCHC: 32.4 g/dL (ref 30.0–36.0)
MCV: 99.5 fL (ref 80.0–100.0)
Monocytes Absolute: 0.5 10*3/uL (ref 0.1–1.0)
Monocytes Relative: 7 %
Neutro Abs: 4 10*3/uL (ref 1.7–7.7)
Neutrophils Relative %: 52 %
Platelets: 273 10*3/uL (ref 150–400)
RBC: 4.1 MIL/uL (ref 3.87–5.11)
RDW: 12.3 % (ref 11.5–15.5)
WBC: 7.6 10*3/uL (ref 4.0–10.5)
nRBC: 0 % (ref 0.0–0.2)

## 2019-09-04 LAB — BASIC METABOLIC PANEL
Anion gap: 8 (ref 5–15)
BUN: 14 mg/dL (ref 6–20)
CO2: 24 mmol/L (ref 22–32)
Calcium: 9 mg/dL (ref 8.9–10.3)
Chloride: 104 mmol/L (ref 98–111)
Creatinine, Ser: 0.91 mg/dL (ref 0.44–1.00)
GFR calc Af Amer: >60 mL/min (ref >60)
GFR calc non Af Amer: >60 mL/min (ref >60)
Glucose, Bld: 79 mg/dL (ref 70–99)
Potassium: 3.5 mmol/L (ref 3.5–5.1)
Sodium: 136 mmol/L (ref 135–145)

## 2019-09-04 NOTE — ED Provider Notes (Signed)
[facility] DEPT Provider Note   CSN: [id number] Arrival date & time: 09/03/19  2318     History No chief complaint on file.   Tina [name] is a 38 y.o. female.  HPI     This is a 38 year old female with a history of epilepsy, seizures, stroke who presents with seizure activity.  Patient currently takes Keppra and Trileptal.  She does not follow closely with a neurologist.  Per report, she had several seizures today.  Patient cannot provide any information.  She does not remember these events.  She reports waking up confused and "not knowing who is around me."  Patient reports some increased recent seizure activity.  She reports compliance with medication but did miss her dose this evening.  She denies any recent fevers, cough, illnesses, changes in medication, alcohol or drug use.  Past Medical History:  Diagnosis Date  . Anxiety   . Asthma   . Depression   . Epilepsy ([place])   . Fibroid tumor   . Migraine   . Seizures ([place])    last seizure 05/2016  . Seizures ([place])   . Stomach ulcer   . Stroke [facility])    reports residual left side weakness    Patient Active Problem List   Diagnosis Date Noted  . Alcohol abuse   . Seizure ([place]) 03/15/2016  . Alcohol use 03/15/2016  . Hypokalemia 03/15/2016  . Prolonged Q-T interval on ECG   . Somnolence   . 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 Asthma 02/19/2016  . 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 Non compliance with medical treatment 12/07/2015  . Gastritis 11/08/2015  . Allergic rhinitis 11/08/2015  . Epilepsy ([place])   . Major depressive disorder, recurrent ([place]) 09/10/2015  . Suicidal ideations   . Suicide attempt [facility])     Past Surgical History:  Procedure Laterality Date  . UMBILICAL HERNIA REPAIR  12/2014     OB History    Gravida  0   Para  0   Term  0   Preterm  0   AB  0   Living        SAB  0   TAB  0   Ectopic  0   Multiple      Live  Births              Family History  Problem Relation Age of Onset  . Heart failure Mother   . Cancer Father   . Seizures Father   . Drug abuse Sister   . Alcohol abuse Sister   . Alcohol abuse Sister   . Drug abuse Sister     Social History   Tobacco Use  . Smoking status: Current Some Day Smoker    Packs/day: 0.25    Years: 0.00    Pack years: 0.00    Types: Cigars  . Smokeless tobacco: Never Used  . Tobacco comment: BLACK MILD  Substance Use Topics  . Alcohol use: Yes    Alcohol/week: 1.0 standard drinks    Types: 1 Cans of beer per week    Comment: occ  . Drug use: Yes    Types: Marijuana    Home Medications Prior to Admission medications   Medication Sig Start Date End Date Taking? Authorizing Provider  albuterol (PROVENTIL HFA;VENTOLIN HFA) 108 (90 Base) MCG/ACT inhaler Inhale 2 puffs into the lungs every 4 (four) hours as needed for wheezing or shortness of breath. 02/21/16  Yes Newlin, Enobong,  MD  cetirizine (ZYRTEC) 10 MG tablet Take 1 tablet (10 mg total) by mouth daily as needed for allergies. Patient taking differently: Take 10 mg by mouth daily.  02/19/16  Yes Charlott Rakes, MD  levETIRAcetam (KEPPRA XR) 500 MG 24 hr tablet Take 3 tablets (1,500 mg total) by mouth 2 (two) times daily. 09/03/16 09/04/19 Yes Deno Etienne, DO  naproxen sodium (ALEVE) 220 MG tablet Take 440 mg by mouth 2 (two) times daily as needed (pain).   Yes [provider]  Oxcarbazepine (TRILEPTAL) 300 MG tablet 2 pills (600mg ) by mouth 2 (two) times daily. Patient taking differently: Take 600 mg by mouth 2 (two) times daily.  08/19/16  Yes Melvenia Beam, MD  pantoprazole (PROTONIX) 20 MG tablet Take 1 tablet (20 mg total) by mouth daily. Patient taking differently: Take 20 mg by mouth 2 (two) times daily.  02/19/16  Yes Charlott Rakes, MD  bismuth subsalicylate (PEPTO-BISMOL) 262 MG/15ML suspension Take 30 mLs by mouth every 6 (six) hours as needed. Patient not taking: Reported  on 09/03/2016 02/19/16   Charlott Rakes, MD  divalproex (DEPAKOTE ER) 500 MG 24 hr tablet Take 1 tablet (500 mg total) by mouth 2 (two) times daily. Patient not taking: Reported on 09/03/2016 07/17/16   Melvenia Beam, MD  fluticasone furoate-vilanterol (BREO ELLIPTA) 200-25 MCG/INH AEPB Inhale 1 puff into the lungs daily after breakfast. For Asthma Patient not taking: Reported on 09/04/2019 02/21/16   Charlott Rakes, MD  hydrOXYzine (ATARAX/VISTARIL) 25 MG tablet Take 1 tablet (25 mg total) by mouth every 6 (six) hours as needed for anxiety. Patient not taking: Reported on 09/03/2016 09/12/15   Lindell Spar I, NP  ibuprofen (ADVIL) 600 MG tablet Take 1 tablet (600 mg total) by mouth every 6 (six) hours as needed for moderate pain. Patient not taking: Reported on 09/04/2019 08/28/19   Domenic Moras, PA-C  Lacosamide (VIMPAT) 150 MG TABS Take 1 tablet (150 mg total) by mouth 2 (two) times daily. 09/03/16 10/03/16  Deno Etienne, DO    Allergies    Bee venom, Chocolate, Mosquito (diagnostic), Orange fruit [citrus], Oregano [origanum oil], Penicillins, Dilantin [phenytoin sodium extended], Garlic, Tramadol, Pork-derived products, Valproic acid and related, Latex, Poractant alfa, and Tape  Review of Systems   Review of Systems  Constitutional: Negative for fever.  Respiratory: Negative for shortness of breath.   Cardiovascular: Negative for chest pain.  Gastrointestinal: Negative for abdominal pain, nausea and vomiting.  Genitourinary: Negative for dysuria.  Neurological: Positive for seizures and headaches. Negative for dizziness.  All other systems reviewed and are negative.   Physical Exam Updated Vital Signs BP 116/69   Pulse 99   Temp 98.1 F (36.7 C) (Oral)   Resp 18   Ht 1.626 m (5\' 4" )   Wt 63.5 kg   LMP 08/14/2019   SpO2 96%   BMI 24.03 kg/m   Physical Exam Vitals and nursing note reviewed.  Constitutional:      Appearance: She is well-developed. She is not ill-appearing.    HENT:     Head: Normocephalic and atraumatic.     Mouth/Throat:     Mouth: Mucous membranes are moist.  Eyes:     Pupils: Pupils are equal, round, and reactive to light.  Cardiovascular:     Rate and Rhythm: Normal rate and regular rhythm.     Heart sounds: Normal heart sounds.  Pulmonary:     Effort: Pulmonary effort is normal. No respiratory distress.     Breath  sounds: No wheezing.  Abdominal:     General: Bowel sounds are normal.     Palpations: Abdomen is soft.  Musculoskeletal:     Cervical back: Neck supple.     Right lower leg: No edema.     Left lower leg: No edema.  Skin:    General: Skin is warm and dry.  Neurological:     Mental Status: She is alert and oriented to person, place, and time.     Comments: Oriented x3, cranial nerves II through XII intact, 5 out of 5 strength in all 4 extremities with the exception of the left hand with 4 out of 5 strength, no dysmetria to finger-nose-finger  Psychiatric:        Mood and Affect: Mood normal.     ED Results / Procedures / Treatments   Labs (all labs ordered are listed, but only abnormal results are displayed) Labs Reviewed  URINALYSIS, ROUTINE W REFLEX MICROSCOPIC - Abnormal; Notable for the following components:      Result Value   Specific Gravity, Urine 1.032 (*)    Ketones, ur 5 (*)    Protein, ur 30 (*)    Bacteria, UA RARE (*)    All other components within normal limits  CBC WITH DIFFERENTIAL/PLATELET  BASIC METABOLIC PANEL  I-STAT BETA HCG BLOOD, ED ([facility], [facility], [facility] ONLY)  CBG MONITORING, ED    EKG None  Radiology No results found.  Procedures Procedures (including critical care time)  Medications Ordered in ED Medications  levETIRAcetam (KEPPRA) IVPB 1000 mg/100 mL premix (0 mg Intravenous Stopped 09/04/19 0032)    ED Course  I have reviewed the triage vital signs and the nursing notes.  Pertinent labs & imaging results that were available during my care of the patient were reviewed by me  and considered in my medical decision making (see chart for details).    MDM Rules/Calculators/A&P                       Patient presents with reported seizure-like activity.  She is overall nontoxic and vital signs are reassuring.  Her neurologic exam is at her baseline.  She has a history of stroke with residual left hand weakness.  Basic lab work-up reviewed.  No significant metabolic derangements.  No evidence of infection.  Patient denies any infectious symptoms.  She was loaded with Keppra.  She reports taking Keppra and Trileptal.  She appears to have previously been on Depakote.  She may be difficult to manage.  Encourage close follow-up with neurology.  Patient reports she has a scheduled appointment.  No recurrent seizures while in the emergency room.  After history, exam, and medical workup I feel the patient has been appropriately medically screened and is safe for discharge home. Pertinent diagnoses were discussed with the patient. Patient was given return precautions.   Final Clinical Impression(s) / ED Diagnoses Final diagnoses:  Seizure [facility])    Rx / [place] Orders ED Discharge Orders    None       Jaymison Luber, Barbette Hair, MD 09/04/19 [phone]

## 2019-09-04 NOTE — Discharge Instructions (Addendum)
You were seen here for seizure activity.  Your work-up is largely reassuring.  It is important that you take your seizure medications daily and on time.  Follow-up with [facility] neurology for follow-up and medication adjustment.  You should not drive or operate heavy machinery until cleared by neurology.

## 2019-09-04 NOTE — ED Notes (Signed)
Pt ambulatory to restroom without concern

## 2019-09-22 ENCOUNTER — Encounter: Payer: Self-pay | Admitting: Neurology

## 2019-09-23 ENCOUNTER — Other Ambulatory Visit: Payer: Self-pay

## 2019-09-23 ENCOUNTER — Telehealth (INDEPENDENT_AMBULATORY_CARE_PROVIDER_SITE_OTHER): Payer: Self-pay | Admitting: Neurology

## 2019-09-23 DIAGNOSIS — G40301 Generalized idiopathic epilepsy and epileptic syndromes, not intractable, with status epilepticus: Secondary | ICD-10-CM

## 2019-09-23 MED ORDER — OXCARBAZEPINE 300 MG PO TABS: ORAL_TABLET | ORAL | 11 refills | Status: AC

## 2019-09-23 MED ORDER — LEVETIRACETAM 500 MG PO TABS: ORAL_TABLET | ORAL | 11 refills | Status: AC

## 2019-09-23 NOTE — Progress Notes (Signed)
Virtual Visit via Video Note The purpose of this virtual visit is to provide medical care while limiting exposure to the novel coronavirus.    Consent was obtained for video visit:  Yes.   Answered questions that patient had about telehealth interaction:  Yes.   I discussed the limitations, risks, security and privacy concerns of performing an evaluation and management service by telemedicine. I also discussed with the patient that there may be a patient responsible charge related to this service. The patient expressed understanding and agreed to proceed.  Pt location: Home Physician Location: office Name of referring provider:  Leslie Andrea, FNP I connected with Tina [name] at patients initiation/request on 09/23/2019 at  9:00 AM EST by video enabled telemedicine application and verified that I am speaking with the correct person using two identifiers. Pt MRN:  [id number] Pt DOB:  1981/04/20 Video Participants:  Tina [name]   History of Present Illness:  This is a 39 year old right-handed woman with a history of epilepsy, stroke, migraine, anxiety, depression, presenting for evaluation of seizures. She states that seizures started at age 65, however medication was started at age 64. Records from her prior neurologist Dr. Jaynee Eagles were reviewed. Per Dr. Cathren Laine notes in 2017, seizures started at age 48 and she was not on seizure medication until age 20. She reports staring spells followed by shaking when she was younger. She still has the staring spells sometimes. Seizures usually start with a headache, then she feels hot. Her hands ball up then she would lose consciousness. She reports a seizure this morning. Prior to this was a couple of weeks ago. She was told she stopped breathing and her friend had to do chest compressions. She reports seizures around 3 times a month, sometimes she has them back to back. One time she had 8 seizures in one day. She has seizures in her  sleep. She has injured herself with the seizures, dislocating her right elbow, left knee. She had right knee surgery. Triggers include sleep deprivation, stress, not eating well, and alcohol. She lost her mother last year and is under a lot of stress. She forgets her medications at times. She has not been drinking alcohol recently. She is on oxcarbazepine 300mg  2 tabs BID and Levetiracetam ER 500mg  3 tabs BID. She states she has been on the same doses for the past 7 years. Records from Dr. Jaynee Eagles indicate that she had taken Depakote, Dilantin, Topamax, Trileptal (for mood), and was started on Keppra in 2016. 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  She has had several EEGs, a routine and 24-hour EEG in 2017 which were normal. She also had an ambulatory 72-hour EEG in 12/2015 which was abnormal, reporting a burst of high voltage, bifrontal delta (2Hz ) slowing lasting 1 second, then 2 electrographic seizures with a burst of generalized rhythmic sharp activity lasting 20 and 40 seconds (no video or log sheet). MRI brain with and without contrast in 12/2015 was normal.   She reports a history of a stroke at age 47 while living in [place], with residual left-sided weakness. She has occasional tingling in her fingers, her feet and hands can swell up. She reports that people have told her she stares off on a daily basis, "my mind just goes somewhere." She  usually gets 5 hours of sleep and wakes up several times at night. She had to leave [place] due to homelessness, she is currently living with her girlfriend in [place]. She states things are just so stressful and trying. She is not driving.   Epilepsy Risk Factors:  Her father had epilepsy. She had febrile seizures when younger. Otherwise she had a normal birth and early  development.  There is no history of CNS infections such as meningitis/encephalitis, significant traumatic brain injury, neurosurgical procedures.  Prior AEDs: Depakote, Dilantin, Topamax   PAST MEDICAL HISTORY: Past Medical History:  Diagnosis Date  . Anxiety   . Asthma   . Depression   . Epilepsy ([place])   . Fibroid tumor   . Migraine   . Seizures ([place])    last seizure 05/2016  . Seizures ([place])   . Stomach ulcer   . Stroke [facility])    reports residual left side weakness    PAST SURGICAL HISTORY: Past Surgical History:  Procedure Laterality Date  . CHOLECYSTECTOMY    . COLONOSCOPY    . KNEE ARTHROSCOPY Right   . UMBILICAL HERNIA REPAIR  12/2014  . UPPER GI ENDOSCOPY      MEDICATIONS: Current Outpatient Medications on File Prior to Visit  Medication Sig Dispense Refill  . albuterol (PROVENTIL HFA;VENTOLIN HFA) 108 (90 Base) MCG/ACT inhaler Inhale 2 puffs into the lungs every 4 (four) hours as needed for wheezing or shortness of breath. 54 g 3  . bismuth subsalicylate (PEPTO-BISMOL) 262 MG/15ML suspension Take 30 mLs by mouth every 6 (six) hours as needed. 360 mL 1  . cetirizine (ZYRTEC) 10 MG tablet Take 1 tablet (10 mg total) by mouth daily as needed for allergies. (Patient taking differently: Take 10 mg by mouth daily. ) 30 tablet 3  . naproxen sodium (ALEVE) 220 MG tablet Take 440 mg by mouth 2 (two) times daily as needed (pain).    . Oxcarbazepine (TRILEPTAL) 300 MG tablet 2 pills (600mg ) by mouth 2 (two) times daily. (Patient taking differently: Take 600 mg by mouth 2 (two) times daily. ) 120 tablet 11  . pantoprazole (PROTONIX) 20 MG tablet Take 1 tablet (20 mg total) by mouth daily. (Patient taking differently: Take 20 mg by mouth 2 (two) times daily. ) 30 tablet 3  . levETIRAcetam (KEPPRA XR) 500 MG 24 hr tablet Take 3 tablets (1,500 mg total) by mouth 2 (two) times daily. 180 tablet 0   No current facility-administered medications on file prior to visit.     ALLERGIES: Allergies  Allergen Reactions  . Bee Venom Anaphylaxis and Swelling    Swelling of whole body   . Chocolate Anaphylaxis  . Mosquito (Diagnostic) Anaphylaxis and Swelling    Swelling of whole body   . Orange Fruit [Citrus] Swelling and Other (See Comments)    Makes tongue bleed   . Oregano [Origanum Oil] Swelling    Swelling of roof of mouth   . Penicillins Hives, Shortness Of Breath and Other (See Comments)    Has patient had a PCN reaction causing immediate rash, facial/tongue/throat swelling, SOB or lightheadedness with hypotension: Yes Has patient had a PCN reaction causing severe rash involving mucus membranes or skin necrosis: Yes Has patient had a PCN reaction that required hospitalization Yes Has patient had a PCN reaction occurring within the last 10 years: No If all of the above answers are "NO", then may proceed with Cephalosporin use.   . Dilantin [Phenytoin Sodium Extended] Itching and Swelling  Whole body   . Garlic Itching and Other (See Comments)    Makes eyes burn  . Tramadol Itching and Swelling    Whole body   . Pork-Derived Products Nausea And Vomiting  . Valproic Acid And Related Hives  . Latex Rash  . Poractant Alfa Nausea And Vomiting  . Tape Rash    FAMILY HISTORY: Family History  Problem Relation Age of Onset  . Heart failure Mother   . Cancer Father   . Seizures Father   . Drug abuse Sister   . Alcohol abuse Sister   . Alcohol abuse Sister   . Drug abuse Sister      Current Outpatient Medications on File Prior to Visit  Medication Sig Dispense Refill  . albuterol (PROVENTIL HFA;VENTOLIN HFA) 108 (90 Base) MCG/ACT inhaler Inhale 2 puffs into the lungs every 4 (four) hours as needed for wheezing or shortness of breath. 54 g 3  . bismuth subsalicylate (PEPTO-BISMOL) 262 MG/15ML suspension Take 30 mLs by mouth every 6 (six) hours as needed. 360 mL 1  . cetirizine (ZYRTEC) 10 MG tablet Take 1 tablet (10 mg total) by mouth daily as  needed for allergies. (Patient taking differently: Take 10 mg by mouth daily. ) 30 tablet 3  . naproxen sodium (ALEVE) 220 MG tablet Take 440 mg by mouth 2 (two) times daily as needed (pain).    . Oxcarbazepine (TRILEPTAL) 300 MG tablet 2 pills (600mg ) by mouth 2 (two) times daily. (Patient taking differently: Take 600 mg by mouth 2 (two) times daily. ) 120 tablet 11  . pantoprazole (PROTONIX) 20 MG tablet Take 1 tablet (20 mg total) by mouth daily. (Patient taking differently: Take 20 mg by mouth 2 (two) times daily. ) 30 tablet 3  . levETIRAcetam (KEPPRA XR) 500 MG 24 hr tablet Take 3 tablets (1,500 mg total) by mouth 2 (two) times daily. 180 tablet 0   No current facility-administered medications on file prior to visit.     Observations/Objective:   Vitals:   09/22/19 1112  Weight: 150 lb (68 kg)  Height: 5\' 4"  (1.626 m)   GEN:  The patient appears stated age and is in NAD.  Neurological examination: Patient is awake, alert, oriented x 3. No aphasia or dysarthria. Intact fluency and comprehension. Remote and recent memory intact. Able to name and repeat. Cranial nerves: Extraocular movements intact with no nystagmus. No facial asymmetry. Motor: moves all extremities symmetrically, at least anti-gravity x 4. Good finger taps. No incoordination on finger to nose testing. Gait: narrow-based and steady, difficulty with tandem walk.   Assessment and Plan:   This is a 39 year old right-handed woman with a history of epilepsy, stroke, migraine, anxiety, depression, presenting for evaluation of seizures. She had a prolonged EEG in 2017 reporting electrographic seizures with bursts of generalized rhythmic sharp activity lasting 20-40 seconds. She continues to report around 3 seizures a month. Increase oxcarbazepine to 900mg  BID (300mg  3 tabs BID), continue Levetiracetam 500mg  3 tabs BID (1500mg  BID). EMU admission would be helpful if seizures continue on therapeutic doses of 2 AEDs, as psychogenic  events have also been reported. She is in [facility] currently and may or may not return to [place]. Follow-up as needed if she returns to [facility]. Otherwise I recommended neurology follow-up in [place]. She does not drive.   Follow Up Instructions:   -I discussed the assessment and treatment plan with the patient. The patient was provided an opportunity to ask questions and all were  answered. The patient agreed with the plan and demonstrated an understanding of the instructions.   The patient was advised to call back or seek an in-person evaluation if the symptoms worsen or if the condition fails to improve as anticipated.   Cameron Sprang, MD
